# Patient Record
Sex: Female | Born: 1955 | Race: White | Hispanic: No | Marital: Married | Smoking: Never smoker
Health system: Southern US, Community
[De-identification: ages and names within clinical notes are randomized; demographics above are authoritative.]

## PROBLEM LIST (undated history)

## (undated) DIAGNOSIS — I719 Aortic aneurysm of unspecified site, without rupture: Secondary | ICD-10-CM

## (undated) DIAGNOSIS — F419 Anxiety disorder, unspecified: Secondary | ICD-10-CM

## (undated) DIAGNOSIS — E039 Hypothyroidism, unspecified: Secondary | ICD-10-CM

## (undated) DIAGNOSIS — E785 Hyperlipidemia, unspecified: Secondary | ICD-10-CM

## (undated) DIAGNOSIS — I10 Essential (primary) hypertension: Secondary | ICD-10-CM

## (undated) HISTORY — PX: TUBAL LIGATION: SHX77

## (undated) HISTORY — PX: MOHS SURGERY: SUR867

---

## 2020-05-21 DIAGNOSIS — E782 Mixed hyperlipidemia: Secondary | ICD-10-CM | POA: Insufficient documentation

## 2020-05-21 DIAGNOSIS — E039 Hypothyroidism, unspecified: Secondary | ICD-10-CM | POA: Insufficient documentation

## 2020-05-21 DIAGNOSIS — F419 Anxiety disorder, unspecified: Secondary | ICD-10-CM | POA: Insufficient documentation

## 2021-05-29 DIAGNOSIS — C801 Malignant (primary) neoplasm, unspecified: Secondary | ICD-10-CM

## 2021-05-29 HISTORY — DX: Malignant (primary) neoplasm, unspecified: C80.1

## 2021-07-29 ENCOUNTER — Ambulatory Visit: Payer: Self-pay | Admitting: Surgery

## 2021-07-31 ENCOUNTER — Other Ambulatory Visit: Payer: Self-pay | Admitting: Radiation Oncology

## 2021-07-31 ENCOUNTER — Ambulatory Visit
Admission: RE | Admit: 2021-07-31 | Discharge: 2021-07-31 | Disposition: A | Discharge status: Home / Self Care | Payer: Self-pay | Source: Ambulatory Visit | Attending: Radiation Oncology | Admitting: Radiation Oncology

## 2021-07-31 ENCOUNTER — Inpatient Hospital Stay
Admission: RE | Admit: 2021-07-31 | Discharge: 2021-07-31 | Disposition: A | Discharge status: Home / Self Care | Payer: Self-pay | Source: Ambulatory Visit | Attending: Radiation Oncology | Admitting: Radiation Oncology

## 2021-07-31 ENCOUNTER — Telehealth: Payer: Self-pay | Admitting: Hematology and Oncology

## 2021-07-31 DIAGNOSIS — C50412 Malignant neoplasm of upper-outer quadrant of left female breast: Secondary | ICD-10-CM

## 2021-07-31 NOTE — Telephone Encounter (Signed)
Scheduled appt per 5/3 referral. Pt is aware of appt date and time. Pt is aware to arrive 15 mins prior to appt time and to bring and updated insurance card. Pt is aware of appt location.   ?

## 2021-08-04 NOTE — Progress Notes (Signed)
?Radiation Oncology         [phone] ?________________________________ ? ?Name: Amber Mccormick        MRN: [id number]  ?Date of Service: 08/08/2021 DOB: 29-May-1955 ? ?HW:[id number], Truddie Crumble, PA-C  Erroll Luna, MD    ? ?REFERRING PHYSICIAN: Erroll Luna, MD ? ? ?DIAGNOSIS: The encounter diagnosis was Malignant neoplasm of upper-outer quadrant of left breast in female, estrogen receptor positive ([place]). ? ? ?HISTORY OF PRESENT ILLNESS: Amber Mccormick is a 66 y.o. female seen at the request of Dr. Brantley Stage for a diagnosis of left breast cancer.  The patient was found to have a palpable mass in her left breast and was seen by her PCP.  Diagnostic work-up included ultrasound as well and showed a  2 cm mass in the 2:30 position of the left breast, she also had a single left axillary lymph node that was suspicious, she underwent a biopsy at an outside facility of the breast and lymph node.  The breast biopsy showed grade 2 invasive ductal carcinoma, and metastatic carcinoma was seen in the axillary lymph node.  Her cancer was ER/PR positive, HER2 was amplified.  She has met with Dr. Brantley Stage and he recommended an MRI for extent of disease.  She is seen today to discuss treatment recommendations.  Dr. Brantley Stage recommends neoadjuvant chemotherapy prior to breast conserving surgery.  She met with Dr. Lindi Adie today as well.  She is seen by [place] this afternoon to discuss adjuvant radiotherapy following surgery. ? ? ? ?PREVIOUS RADIATION THERAPY: No ? ? ?PAST MEDICAL HISTORY: History reviewed. No pertinent past medical history.   ? ? ?PAST SURGICAL HISTORY:History reviewed. No pertinent surgical history. ? ? ?FAMILY HISTORY:  ?Family History  ?Problem Relation Age of Onset  ? Pancreatic cancer Father   ? Bone cancer Maternal Uncle   ? ? ? ?SOCIAL HISTORY:  reports that she has never smoked. She has never used smokeless tobacco. She reports that she does not drink alcohol. The patient is married and lives in [place].  She is accompanied by her husband.  ? ? ?ALLERGIES: Patient has no allergy information on record. ? ? ?MEDICATIONS:  ?Current Outpatient Medications  ?Medication Sig Dispense Refill  ? amitriptyline (ELAVIL) 10 MG tablet Take 10 mg by mouth at bedtime.    ? atorvastatin (LIPITOR) 10 MG tablet Take 10 mg by mouth daily.    ? dexamethasone (DECADRON) 4 MG tablet Take 1 tablet (4 mg total) by mouth 2 (two) times daily. 1 tablet day before chemo 1 tablet day after chemo with food 30 tablet 1  ? levothyroxine (SYNTHROID) 50 MCG tablet Take 50 mcg by mouth daily.    ? lidocaine-prilocaine (EMLA) cream Apply to affected area once 30 g 3  ? metroNIDAZOLE (METROCREAM) 0.75 % cream Apply topically 2 (two) times daily as needed.    ? ondansetron (ZOFRAN) 8 MG tablet Take 1 tablet (8 mg total) by mouth 2 (two) times daily as needed (Nausea or vomiting). Start on the third day after chemotherapy. 30 tablet 1  ? prochlorperazine (COMPAZINE) 10 MG tablet Take 1 tablet (10 mg total) by mouth every 6 (six) hours as needed (Nausea or vomiting). 30 tablet 1  ? vitamin B-12 (CYANOCOBALAMIN) 500 MCG tablet Take by mouth.    ? ?No current facility-administered medications for this encounter.  ? ? ? ?REVIEW OF SYSTEMS: On review of systems, the patient reports that she is doing pretty well overall. No breast specific complaints are noted.  ? ?  ? ?  PHYSICAL EXAM:  ?Wt Readings from Last 3 Encounters:  ?08/08/21 158 lb 14.4 oz (72.1 kg)  ? ?Temp Readings from Last 3 Encounters:  ?08/08/21 97.8 ?F (36.6 ?C) (Temporal)  ? ?BP Readings from Last 3 Encounters:  ?08/08/21 (!) 168/84  ? ?Pulse Readings from Last 3 Encounters:  ?08/08/21 82  ? ? ?In general this is a well appearing caucasian female in no acute distress. She's alert and oriented x4 and appropriate throughout the examination. Cardiopulmonary assessment is negative for acute distress and she exhibits normal effort. Bilateral breast exam is deferred. ? ? ? ?ECOG = 0 ? ?0 -  Asymptomatic (Fully active, able to carry on all predisease activities without restriction) ? ?1 - Symptomatic but completely ambulatory (Restricted in physically strenuous activity but ambulatory and able to carry out work of a light or sedentary nature. For example, light housework, office work) ? ?2 - Symptomatic, <50% in bed during the day (Ambulatory and capable of all self care but unable to carry out any work activities. Up and about more than 50% of waking hours) ? ?3 - Symptomatic, >50% in bed, but not bedbound (Capable of only limited self-care, confined to bed or chair 50% or more of waking hours) ? ?4 - Bedbound (Completely disabled. Cannot carry on any self-care. Totally confined to bed or chair) ? ?5 - Death ? ? Oken MM, Creech RH, Tormey DC, et al. [id number]). "Toxicity and response criteria of the [facility]". [place] Oncol. 5 (6): 649-55 ? ? ? ?LABORATORY DATA:  ?No results found for: WBC, HGB, HCT, MCV, PLT ?No results found for: NA, K, CL, CO2 ?No results found for: ALT, AST, GGT, ALKPHOS, BILITOT ?  ? ?RADIOGRAPHY: No results found. ?   ? ?IMPRESSION/PLAN: ?1. 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 Dr. Lisbeth Renshaw discusses the pathology findings and reviews the nature of left breast disease. She is planning to start neoadjuvant chemotherapy followed by breast conserving surgery. Dr. Lisbeth Renshaw discusses the rationale for  external radiotherapy to the breast  to reduce risks of local recurrence followed by antiestrogen therapy. We discussed the risks, benefits, short, and long term effects of radiotherapy, as well as the curative intent, and the patient is interested in proceeding. Dr. Lisbeth Renshaw discusses the delivery and logistics of radiotherapy and anticipates a course of 6 1/2 weeks of radiotherapy to the left breast and regional nodes with deep inspiration breath hold technique. We will see her back a few weeks after surgery to  discuss the simulation process and anticipate we starting radiotherapy about 4-6 weeks after surgery.  ?  ? ?In a visit lasting 60 minutes, greater than 50% of the time was spent face to face reviewing her case, as well as in preparation of, discussing, and coordinating the patient's care. I was located remotely via Webex while she saw the rest of our team in the clinic. ? ?The above documentation reflects my direct findings during this shared patient visit. Please see the separate note by Dr. Lisbeth Renshaw on this date for the remainder of the patient's plan of care. ? ? ? ?Carola Rhine, PAC ? ? ? ?**Disclaimer: This note was dictated with voice recognition software. Similar sounding words can inadvertently be transcribed and this note may contain transcription errors which may not have been corrected upon publication of note.** ?

## 2021-08-06 ENCOUNTER — Other Ambulatory Visit: Payer: Self-pay | Admitting: Surgery

## 2021-08-06 DIAGNOSIS — C50412 Malignant neoplasm of upper-outer quadrant of left female breast: Secondary | ICD-10-CM

## 2021-08-07 NOTE — Progress Notes (Signed)
New Breast Cancer Diagnosis: Left Breast UOQ ? ?Did patient present with symptoms (if so, please note symptoms) or screening mammography?: Patient notes a mass in her left breast about 2 months ago.  She brought this to her PCP's attention and ultrasound was done. ? ?Location and Extent of disease :left breast. Located at 2:30 o'clock position, measured 2 cm in greatest dimension. Adenopathy yes. ? ?Histology per Pathology Report: grade 2, Invasive Ductal Carcinoma ? ?Receptor Status: ER(positive), PR (positive), Her2-neu (positive), Ki-(20%) ? ? ?Surgeon and surgical plan, if any:  ?Dr. Brantley Stage 07/29/2021 ?-Refer to medical oncology for consideration of neoadjuvant chemotherapy given positive node in 2 cm mass making her stage II.  ?-If not possible, can proceed with left breast lumpectomy with sentinel lymph node mapping and targeted left axillary lymph node biopsy. They like the idea of chemotherapy first if she is a candidate.  ?- Ambulatory Referral to Radiation Oncology ?- MRI breast bilateral with and without contrast; Future ? ? ?Medical oncologist, treatment if any:   ?Dr. Lindi Adie 08/08/2021 1 pm ?Recommendation based on multidisciplinary tumor board: ?1. Neoadjuvant chemotherapy with TCHP x6 followed by HP maintenance versus Kadcyla maintenance ?2. Followed by breast conserving surgery with targeted axillary dissection ?3. Followed by adjuvant radiation therapy ?4.  Followed by adjuvant antiestrogen therapy ?-Chemo start date: undecided at this time, waiting for port insertion. ? ? ?Family History of Breast/Ovarian/Prostate Cancer: No ? ?Lymphedema issues, if any: No    ? ?Pain issues, if any: No   ? ?SAFETY ISSUES: ?Prior radiation? No ?Pacemaker/ICD? no ?Possible current pregnancy? Postmenopausal ?Is the patient on methotrexate? No ? ?Current Complaints / other details:   ? ?

## 2021-08-08 ENCOUNTER — Ambulatory Visit
Admission: RE | Admit: 2021-08-08 | Discharge: 2021-08-08 | Disposition: A | Discharge status: Home / Self Care | Payer: Medicare Other | Source: Ambulatory Visit | Attending: Radiation Oncology | Admitting: Radiation Oncology

## 2021-08-08 ENCOUNTER — Other Ambulatory Visit: Payer: Self-pay

## 2021-08-08 ENCOUNTER — Ambulatory Visit: Payer: Self-pay | Admitting: Surgery

## 2021-08-08 ENCOUNTER — Encounter: Payer: Self-pay | Admitting: Radiation Oncology

## 2021-08-08 ENCOUNTER — Inpatient Hospital Stay: Payer: Medicare Other | Admitting: Emergency Medicine

## 2021-08-08 ENCOUNTER — Inpatient Hospital Stay: Payer: Medicare Other | Attending: Hematology and Oncology | Admitting: Hematology and Oncology

## 2021-08-08 VITALS — BP 168/84 | HR 82 | Temp 97.8°F | Resp 19 | Ht 65.5 in | Wt 158.9 lb

## 2021-08-08 DIAGNOSIS — Z170 Estrogen receptor positive status [ER+]: Secondary | ICD-10-CM | POA: Insufficient documentation

## 2021-08-08 DIAGNOSIS — Z79899 Other long term (current) drug therapy: Secondary | ICD-10-CM | POA: Insufficient documentation

## 2021-08-08 DIAGNOSIS — Z5112 Encounter for antineoplastic immunotherapy: Secondary | ICD-10-CM | POA: Insufficient documentation

## 2021-08-08 DIAGNOSIS — C50412 Malignant neoplasm of upper-outer quadrant of left female breast: Secondary | ICD-10-CM | POA: Insufficient documentation

## 2021-08-08 DIAGNOSIS — C773 Secondary and unspecified malignant neoplasm of axilla and upper limb lymph nodes: Secondary | ICD-10-CM | POA: Insufficient documentation

## 2021-08-08 DIAGNOSIS — Z5189 Encounter for other specified aftercare: Secondary | ICD-10-CM | POA: Diagnosis not present

## 2021-08-08 DIAGNOSIS — Z5111 Encounter for antineoplastic chemotherapy: Secondary | ICD-10-CM | POA: Insufficient documentation

## 2021-08-08 MED ORDER — ONDANSETRON HCL 8 MG PO TABS: 8.0000 mg | ORAL_TABLET | Freq: Two times a day (BID) | ORAL | 1 refills | Status: DC | PRN

## 2021-08-08 MED ORDER — LIDOCAINE-PRILOCAINE 2.5-2.5 % EX CREA: TOPICAL_CREAM | CUTANEOUS | 3 refills | Status: DC

## 2021-08-08 MED ORDER — PROCHLORPERAZINE MALEATE 10 MG PO TABS: 10.0000 mg | ORAL_TABLET | Freq: Four times a day (QID) | ORAL | 1 refills | Status: DC | PRN

## 2021-08-08 MED ORDER — DEXAMETHASONE 4 MG PO TABS: 4.0000 mg | ORAL_TABLET | Freq: Two times a day (BID) | ORAL | 1 refills | Status: DC

## 2021-08-08 NOTE — Assessment & Plan Note (Addendum)
07/01/2021:Screening mammogram 06/27/2021 detected abnormality in the left breast which led to diagnostic mammogram and left breast ultrasound that revealed hypoechoic mass with irregular borders 2:30 position 2 cm left axilla: 1 abnormal lymph node, grade 2 IDC with DCIS ER 99%, PR 95%, HER2 2+ by IHC, FISH positive, Ki-67 20%, left axilla lymph node: Positive  ? ?Pathology and radiology counseling: Discussed with the patient, the details of pathology including the type of breast cancer,the clinical staging, the significance of ER, PR and HER-2/neu receptors and the implications for treatment. After reviewing the pathology in detail, we proceeded to discuss the different treatment options between surgery, radiation, chemotherapy, antiestrogen therapies. ? ?Recommendation based on multidisciplinary tumor board: ?1. Neoadjuvant chemotherapy with TCHP x6 followed by HP maintenance versus Kadcyla maintenance ?2. Followed by breast conserving surgery with targeted axillary dissection ?3. Followed by adjuvant radiation therapy ?4.  Followed by adjuvant antiestrogen therapy ? ?Chemotherapy Counseling: I discussed the risks and benefits of chemotherapy including the risks of nausea/ vomiting, risk of infection from low WBC count, fatigue due to chemo or anemia, bruising or bleeding due to low platelets, mouth sores, loss/ change in taste and decreased appetite. Liver and kidney function will be monitored through out chemotherapy as abnormalities in liver and kidney function may be a side effect of treatment.  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 Risk of permanent bone marrow dysfunction due to chemo were also discussed. ? ?Plan: ?1. Port placement  ?2. Echocardiogram ?3. Chemotherapy class ?4. Breast MRI ?5. CT chest abdomen pelvis and bone scan for staging ?Genetic counseling will also be arranged ? ?[place]: Treatment of refractory nausea.  After first  cycle of chemo if patient experience chemo induced nausea and vomiting the randomized from cycle 2 to Aloxi plus Dex plus olanzapine or placebo plus Compazine or placebo plus placebo prior to chemo and take home medications for day 2 today for of Dex plus olanzapine or placebo and Compazine or placebo every 8 hours.  If patient does not have nausea after cycle 1, then the trial is complete. ? ?Return to clinic in 1 week to start chemotherapy. ? ? ?

## 2021-08-08 NOTE — Progress Notes (Signed)
START ON PATHWAY REGIMEN - Breast ? ? ?  Cycle 1: A cycle is 21 days: ?    Pertuzumab  ?    Trastuzumab-xxxx  ?    Docetaxel  ?    Carboplatin  ?  Cycles 2 through 6: A cycle is every 21 days: ?    Pertuzumab  ?    Trastuzumab-xxxx  ?    Docetaxel  ?    Carboplatin  ? ?**Always confirm dose/schedule in your pharmacy ordering system** ? ?Patient Characteristics: ?Preoperative or Nonsurgical Candidate (Clinical Staging), Neoadjuvant Therapy followed by Surgery, Invasive Disease, Chemotherapy, HER2 Positive, ER Positive ?Therapeutic Status: Preoperative or Nonsurgical Candidate (Clinical Staging) ?AJCC M Category: cM0 ?AJCC Grade: G2 ?Breast Surgical Plan: Neoadjuvant Therapy followed by Surgery ?ER Status: Positive (+) ?AJCC 8 Stage Grouping: IB ?HER2 Status: Positive (+) ?AJCC T Category: cT1c ?AJCC N Category: cN1 ?PR Status: Positive (+) ?Intent of Therapy: ?Curative Intent, Discussed with Patient ?

## 2021-08-08 NOTE — Progress Notes (Signed)
[facility] ?CONSULT NOTE ? ?Patient Care Team: ?Kathreen Devoid, PA-C as PCP - General (Internal Medicine) ? ?CHIEF COMPLAINTS/PURPOSE OF CONSULTATION:  ?Newly diagnosed breast cancer ? ?HISTORY OF PRESENTING ILLNESS:  ?Amber Mccormick 66 y.o. female is here because of recent diagnosis of left breast cancer.  She had a screening mammogram in March which detected abnormality in the left breast which led to additional mammograms and ultrasounds that revealed hypoechoic mass with irregular borders along with an axillary lymph node.  Biopsy of the breast and the lymph node were positive for grade 2 invasive ductal carcinoma with DCIS that was ER/PR positive HER2 positive with a Ki-67 of 20%.  The lymph node biopsy was also positive.  She was referred to [place] for discussion regarding treatment. ? ?I reviewed her records extensively and collaborated the history with the patient. ? ?SUMMARY OF ONCOLOGIC HISTORY: ?Oncology History  ?Malignant neoplasm of upper-outer quadrant of left breast, estrogen receptor positive ([place])  ?07/01/2021 Initial Diagnosis  ? Screening mammogram 06/27/2021 detected abnormality in the left breast which led to diagnostic mammogram and left breast ultrasound that revealed hypoechoic mass with irregular borders 2:30 position 2 cm left axilla: 1 abnormal lymph node, grade 2 IDC with DCIS ER 99%, PR 95%, HER2 2+ by IHC, FISH positive, Ki-67 20%, left axilla lymph node: Positive ?  ?08/08/2021 Cancer Staging  ? Staging form: Breast, AJCC 8th Edition ?- Clinical stage from 08/08/2021: Stage IB (cT1c, cN1(f), cM0, G2, ER+, PR+, HER2+) - Signed by Nicholas Lose, MD on 08/08/2021 ?Stage prefix: Initial diagnosis ?Method of lymph node assessment: Core biopsy ?Histologic grading system: 3 grade system ? ?  ? ? ? ?MEDICAL HISTORY: Hypothyroidism and hypercholesterolemia ?  ? ?SURGICAL HISTORY: Basal cell skin cancer surgery ?  ? ?SOCIAL HISTORY: Denies any tobacco alcohol or recreational drug  use ?FAMILY HISTORY: No family history of breast cancer ?No family history on file. ? ?ALLERGIES:  has no allergies on file. ? ?MEDICATIONS: Synthroid and cholesterol medication ?REVIEW OF SYSTEMS:   ?Constitutional: Denies fevers, chills or abnormal night sweats ?  ?All other systems were reviewed with the patient and are negative. ? ?PHYSICAL EXAMINATION: ?ECOG PERFORMANCE STATUS: 1 - Symptomatic but completely ambulatory ? ?Vitals:  ? 08/08/21 1257  ?BP: (!) 168/84  ?Pulse: 82  ?Resp: 19  ?Temp: 97.8 ?F (36.6 ?C)  ?SpO2: 97%  ? ?Filed Weights  ? 08/08/21 1257  ?Weight: 158 lb 14.4 oz (72.1 kg)  ? ?   ? ?RADIOGRAPHIC STUDIES: ?I have personally reviewed the radiological reports and agreed with the findings in the report. ? ?ASSESSMENT AND PLAN:  ?Acute malignant neoplasm of upper-outer quadrant of left breast, estrogen receptor positive ([place]) ?07/01/2021:Screening mammogram 06/27/2021 detected abnormality in the left breast which led to diagnostic mammogram and left breast ultrasound that revealed hypoechoic mass with irregular borders 2:30 position 2 cm left axilla: 1 abnormal lymph node, grade 2 IDC with DCIS ER 99%, PR 95%, HER2 2+ by IHC, FISH positive, Ki-67 20%, left axilla lymph node: Positive  ? ?Pathology and radiology counseling: Discussed with the patient, the details of pathology including the type of breast cancer,the clinical staging, the significance of ER, PR and HER-2/neu receptors and the implications for treatment. After reviewing the pathology in detail, we proceeded to discuss the different treatment options between surgery, radiation, chemotherapy, antiestrogen therapies. ? ?Recommendation based on multidisciplinary tumor board: ?1. Neoadjuvant chemotherapy with TCHP x6 followed by HP maintenance versus Kadcyla maintenance ?2. Followed by breast  conserving surgery with targeted axillary dissection ?3. Followed by adjuvant radiation therapy ?4.  Followed by adjuvant antiestrogen  therapy ? ?Chemotherapy Counseling: I discussed the risks and benefits of chemotherapy including the risks of nausea/ vomiting, risk of infection from low WBC count, fatigue due to chemo or anemia, bruising or bleeding due to low platelets, mouth sores, loss/ change in taste and decreased appetite. Liver and kidney function will be monitored through out chemotherapy as abnormalities in liver and kidney function may be a side effect of treatment.  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 Risk of permanent bone marrow dysfunction due to chemo were also discussed. ? ?Plan: ?1. Port placement  ?2. Echocardiogram ?3. Chemotherapy class ?4. Breast MRI ? ? ?[place]: Treatment of refractory nausea.  After first cycle of chemo if patient experience chemo induced nausea and vomiting the randomized from cycle 2 to Aloxi plus Dex plus olanzapine or placebo plus Compazine or placebo plus placebo prior to chemo and take home medications for day 2 today for of Dex plus olanzapine or placebo and Compazine or placebo every 8 hours.  If patient does not have nausea after cycle 1, then the trial is complete. ?We also counseled her about the blood draw study ? ?Return to clinic in 1-2 weeks to start chemotherapy. ? ? ? ?All questions were answered. The patient knows to call the clinic with any problems, questions or concerns. ?  ? Harriette Ohara, MD ?08/08/21 ? ?

## 2021-08-08 NOTE — Research (Signed)
[id number] - TREATMENT OF REFRACTORY NAUSEA ? ?INTRO STUDY/CONSENTS ? ?Patient Amber Mccormick was identified by MD Lindi Adie as a potential candidate for the above listed study.  This Clinical Research Nurse met with Amber Mccormick, [id number], on 08/08/21 in a manner and location that ensures patient privacy to discuss participation in the above listed research study.  Patient is Accompanied by spouse .  A copy of the informed consent document and separate HIPAA Authorization was provided to the patient.  Patient reads, speaks, and understands [place].   ?Patient was provided with the business card of this Nurse and encouraged to contact the research team with any questions.  Approximately 25 minutes were spent with the patient reviewing the informed consent documents.  Patient was provided the option of taking informed consent documents home to review and was encouraged to review at their convenience with their support network, including other care providers. Patient took the consent documents home to review.  Will f/u on patient interest in the next few days. ? ?Amber Guiles 'Learta Codding' Carman Essick, RN, BSN ?Clinical Research Nurse I ?08/08/21 ?2:14 PM ? ? ?

## 2021-08-08 NOTE — Research (Signed)
[employer] 2021-05 - Specimen Collection Study to Evaluate Biomarkers in Subjects with Cancer  ? ?INTRO STUDY/CONSENTS ? ?Patient Amber Mccormick was identified by MD Lindi Adie as a potential candidate for the above listed study.  This Clinical Research Nurse met with Debraann Livingstone, [id number], on 08/08/21 in a manner and location that ensures patient privacy to discuss participation in the above listed research study.  Patient is Accompanied by spouse .  A copy of the informed consent document with embedded HIPAA language was provided to the patient.  Patient reads, speaks, and understands [place].   ?Patient was provided with the business card of this Nurse and encouraged to contact the research team with any questions.  Approximately 25 minutes were spent with the patient reviewing the informed consent documents.  Patient was provided the option of taking informed consent documents home to review and was encouraged to review at their convenience with their support network, including other care providers. Patient took the consent documents home to review.  Will f/u with patient in the next few days to determine interest. ? ?Wells Guiles 'LizaNeysa Bonito, RN, BSN ?Clinical Research Nurse I ?08/08/21 ?2:16 PM ? ? ?

## 2021-08-09 ENCOUNTER — Encounter: Payer: Self-pay | Admitting: *Deleted

## 2021-08-09 ENCOUNTER — Telehealth: Payer: Self-pay | Admitting: Hematology and Oncology

## 2021-08-09 ENCOUNTER — Encounter: Payer: Self-pay | Admitting: Hematology and Oncology

## 2021-08-09 ENCOUNTER — Telehealth: Payer: Self-pay | Admitting: *Deleted

## 2021-08-09 NOTE — Telephone Encounter (Signed)
Scheduled appointment per 5/11 los. Patient is aware. ?

## 2021-08-09 NOTE — Telephone Encounter (Signed)
Spoke to pt, provided navigation resources and contact information. Denies questions or concerns regarding dx or treatment care plan. Discussed further appts and port placement. Informed echo and chemo class will be scheduled earlier d/t port placement and chemo start date. Encourage pt to call with needs. Received verbal understanding. ?

## 2021-08-12 ENCOUNTER — Encounter: Payer: Self-pay | Admitting: *Deleted

## 2021-08-13 ENCOUNTER — Telehealth: Payer: Self-pay | Admitting: Emergency Medicine

## 2021-08-13 ENCOUNTER — Other Ambulatory Visit: Payer: Self-pay | Admitting: Emergency Medicine

## 2021-08-13 ENCOUNTER — Encounter (HOSPITAL_BASED_OUTPATIENT_CLINIC_OR_DEPARTMENT_OTHER): Payer: Self-pay | Admitting: Surgery

## 2021-08-13 ENCOUNTER — Other Ambulatory Visit: Payer: Medicare Other

## 2021-08-13 ENCOUNTER — Other Ambulatory Visit: Payer: Self-pay

## 2021-08-13 DIAGNOSIS — C50412 Malignant neoplasm of upper-outer quadrant of left female breast: Secondary | ICD-10-CM

## 2021-08-13 NOTE — Telephone Encounter (Signed)
[id number] - TREATMENT OF REFRACTORY NAUSEA ? ?Called patient to let her know she is ineligible for [employer] [facility] [id number] study d/t being on amitriptyline which is an anticholinergic medication prohibited by the study.  Pt verbalized understanding and denies any questions or concerns at this time. ? ?Wells Guiles 'Learta Codding' Shanda Cadotte, RN, BSN ?Clinical Research Nurse I ?08/13/21 ?11:54 AM ? ?

## 2021-08-13 NOTE — Telephone Encounter (Signed)
[employer] 2021-05 - Specimen Collection Study to Evaluate Biomarkers in Subjects with Cancer  ? ?Called patient to f/u on potential interest in [place] blood draw study.  Pt states she is interested, agreed to see Research RN on 08/19/21 after chemo education appt to discuss and sign consents.  Pt to have blood drawn on 08/22/21.  Pt denies any questions/concerns at this time. ? ?Wells Guiles 'Learta Codding' Sahaana Weitman, RN, BSN ?Clinical Research Nurse I ?08/13/21 ?11:56 AM ? ?

## 2021-08-15 ENCOUNTER — Telehealth: Payer: Self-pay | Admitting: *Deleted

## 2021-08-15 ENCOUNTER — Telehealth: Payer: Self-pay | Admitting: Emergency Medicine

## 2021-08-15 NOTE — Telephone Encounter (Signed)
Patient called Research Nurse to ask about oral contrast requirement for her upcoming imaging.  Nurse [place] S to f/u with her on questions.  Wells Guiles 'Learta CoddingNeysa Bonito, RN, BSN Clinical Research Nurse I 08/15/21 10:58 AM

## 2021-08-15 NOTE — Progress Notes (Signed)
Patient Care Team: Kathreen Devoid, PA-C as PCP - General (Internal Medicine) Mauro Kaufmann, RN as Oncology Nurse Navigator Rockwell Germany, RN as Oncology Nurse Navigator Nicholas Lose, MD as Consulting Physician (Hematology and Oncology)  DIAGNOSIS:  Encounter Diagnosis  Name Primary?   Malignant neoplasm of upper-outer quadrant of left breast in female, estrogen receptor positive ([place])     SUMMARY OF ONCOLOGIC HISTORY: Oncology History  Malignant neoplasm of upper-outer quadrant of left breast, estrogen receptor positive ([place])  07/01/2021 Initial Diagnosis   Screening mammogram 06/27/2021 detected abnormality in the left breast which led to diagnostic mammogram and left breast ultrasound that revealed hypoechoic mass with irregular borders 2:30 position 2 cm left axilla: 1 abnormal lymph node, grade 2 IDC with DCIS ER 99%, PR 95%, HER2 2+ by IHC, FISH positive, Ki-67 20%, left axilla lymph node: Positive   08/08/2021 Cancer Staging   Staging form: Breast, AJCC 8th Edition - Clinical stage from 08/08/2021: Stage IB (cT1c, cN1(f), cM0, G2, ER+, PR+, HER2+) - Signed by Nicholas Lose, MD on 08/08/2021 Stage prefix: Initial diagnosis Method of lymph node assessment: Core biopsy Histologic grading system: 3 grade system    08/22/2021 -  Chemotherapy   Patient is on Treatment Plan : BREAST  Docetaxel + Carboplatin + Trastuzumab + Pertuzumab  (TCHP) q21d         CHIEF COMPLIANT: Cycle 1 day 1 TCHP (08/23/2021)  INTERVAL HISTORY: Amber Mccormick is a 66 y.o. female is here because of recent diagnosis of left breast cancer. She presents to the clinic today for a follow-up.  She will receive her first cycle of chemotherapy tomorrow.  She is slightly sore from the port but otherwise doing quite well.  She wanted to review the breast MRI report as well as her echocardiogram reports.   ALLERGIES:  has No Known Allergies.  MEDICATIONS:  Current Outpatient Medications  Medication  Sig Dispense Refill   amitriptyline (ELAVIL) 10 MG tablet Take 10 mg by mouth at bedtime.     atorvastatin (LIPITOR) 10 MG tablet Take 10 mg by mouth daily.     cholecalciferol (VITAMIN D3) 25 MCG (1000 UNIT) tablet Take 1,000 Units by mouth daily.     dexamethasone (DECADRON) 4 MG tablet Take 1 tablet (4 mg total) by mouth 2 (two) times daily. 1 tablet day before chemo 1 tablet day after chemo with food 30 tablet 1   diphenhydrAMINE (BENADRYL) 25 MG tablet Take 25 mg by mouth every 6 (six) hours as needed.     ibuprofen (ADVIL) 800 MG tablet Take 1 tablet (800 mg total) by mouth every 8 (eight) hours as needed. 30 tablet 0   levothyroxine (SYNTHROID) 50 MCG tablet Take 50 mcg by mouth daily.     lidocaine-prilocaine (EMLA) cream Apply to affected area once 30 g 3   metroNIDAZOLE (METROCREAM) 0.75 % cream Apply topically 2 (two) times daily as needed.     ondansetron (ZOFRAN) 8 MG tablet Take 1 tablet (8 mg total) by mouth 2 (two) times daily as needed (Nausea or vomiting). Start on the third day after chemotherapy. 30 tablet 1   oxyCODONE (OXY IR/ROXICODONE) 5 MG immediate release tablet Take 1 tablet (5 mg total) by mouth every 6 (six) hours as needed for severe pain. 15 tablet 0   prochlorperazine (COMPAZINE) 10 MG tablet Take 1 tablet (10 mg total) by mouth every 6 (six) hours as needed (Nausea or vomiting). 30 tablet 1   vitamin B-12 (CYANOCOBALAMIN) 500  MCG tablet Take by mouth.     vitamin C (ASCORBIC ACID) 500 MG tablet Take 500 mg by mouth daily.     No current facility-administered medications for this visit.    PHYSICAL EXAMINATION: ECOG PERFORMANCE STATUS: 1 - Symptomatic but completely ambulatory  Vitals:   08/22/21 1127  BP: (!) 154/76  Pulse: 74  Resp: 18  Temp: 97.8 F (36.6 C)  SpO2: 97%   Filed Weights   08/22/21 1127  Weight: 160 lb 11.2 oz (72.9 kg)     LABORATORY DATA:  I have reviewed the data as listed    Latest Ref Rng & Units 08/22/2021   11:24 AM  CMP   Glucose 70 - 99 mg/dL 120    BUN 8 - 23 mg/dL 12    Creatinine 0.44 - 1.00 mg/dL 0.75    Sodium 135 - 145 mmol/L 141    Potassium 3.5 - 5.1 mmol/L 4.0    Chloride 98 - 111 mmol/L 105    CO2 22 - 32 mmol/L 29    Calcium 8.9 - 10.3 mg/dL 9.2    Total Protein 6.5 - 8.1 g/dL 7.2    Total Bilirubin 0.3 - 1.2 mg/dL 0.4    Alkaline Phos 38 - 126 U/L 66    AST 15 - 41 U/L 17    ALT 0 - 44 U/L 12      Lab Results  Component Value Date   WBC 9.0 08/22/2021   HGB 13.3 08/22/2021   HCT 39.0 08/22/2021   MCV 89.9 08/22/2021   PLT 210 08/22/2021   NEUTROABS 7.5 08/22/2021    ASSESSMENT & PLAN:  Malignant neoplasm of upper-outer quadrant of left breast, estrogen receptor positive ([place]) 07/01/2021:Screening mammogram 06/27/2021 detected abnormality in the left breast which led to diagnostic mammogram and left breast ultrasound that revealed hypoechoic mass with irregular borders 2:30 position 2 cm left axilla: 1 abnormal lymph node, grade 2 IDC with DCIS ER 99%, PR 95%, HER2 2+ by IHC, FISH positive, Ki-67 20%, left axilla lymph node: Positive   Treatment plan: 1. Neoadjuvant chemotherapy with TCHP x6 followed by HP maintenance versus Kadcyla maintenance 2. Followed by breast conserving surgery with targeted axillary dissection 3. Followed by adjuvant radiation therapy 4.  Followed by adjuvant antiestrogen therapy -------------------------------------------------------------------------------------------------------------------------------------- Current treatment: Cycle 1 day 1 TCHP Chemo consent obtained, chemo education completed, labs reviewed, antiemetics were reviewed Echocardiogram 08/03/2021: EF 65 to 70%  Return to clinic in 1 week for toxicity check    No orders of the defined types were placed in this encounter.  The patient has a good understanding of the overall plan. she agrees with it. she will call with any problems that may develop before the next visit here. Total time  spent: 30 mins including face to face time and time spent for planning, charting and co-ordination of care   Harriette Ohara, MD 08/22/21    I Gardiner Coins am scribing for Dr. Lindi Adie  I have reviewed the above documentation for accuracy and completeness, and I agree with the above.

## 2021-08-15 NOTE — Telephone Encounter (Signed)
Spoke to pt regarding ERAS drink prior to surgery. Discussed it wasn't a contrast that you take prior to imaging but a drink to help healing after surgery. Received verbal understanding. Confirmed future appts. Denies further questions or needs. Encourage pt to call with further concerns. Received verbal understanding.

## 2021-08-16 ENCOUNTER — Ambulatory Visit
Admission: RE | Admit: 2021-08-16 | Discharge: 2021-08-16 | Disposition: A | Discharge status: Home / Self Care | Payer: Medicare Other | Source: Ambulatory Visit | Attending: Surgery | Admitting: Surgery

## 2021-08-16 ENCOUNTER — Other Ambulatory Visit: Payer: Medicare Other

## 2021-08-16 DIAGNOSIS — C50412 Malignant neoplasm of upper-outer quadrant of left female breast: Secondary | ICD-10-CM

## 2021-08-16 MED ORDER — GADOBUTROL 1 MMOL/ML IV SOLN
7.0000 mL | Freq: Once | INTRAVENOUS | Status: AC | PRN
  Administered 2021-08-16: 7 mL via INTRAVENOUS

## 2021-08-16 NOTE — Progress Notes (Signed)
Pharmacist Chemotherapy Monitoring - Initial Assessment    Anticipated start date: 08/23/21   The following has been reviewed per standard work regarding the patient's treatment regimen: The patient's diagnosis, treatment plan and drug doses, and organ/hematologic function Lab orders and baseline tests specific to treatment regimen  The treatment plan start date, drug sequencing, and pre-medications Prior authorization status  Patient's documented medication list, including drug-drug interaction screen and prescriptions for anti-emetics and supportive care specific to the treatment regimen The drug concentrations, fluid compatibility, administration routes, and timing of the medications to be used The patient's access for treatment and lifetime cumulative dose history, if applicable  The patient's medication allergies and previous infusion related reactions, if applicable   Changes made to treatment plan:  N/A  Follow up needed:  Pending authorization for treatment    Philomena Course, [place], 08/16/2021  1:02 PM

## 2021-08-19 ENCOUNTER — Inpatient Hospital Stay: Payer: Medicare Other

## 2021-08-19 ENCOUNTER — Inpatient Hospital Stay: Payer: Medicare Other | Admitting: Emergency Medicine

## 2021-08-19 ENCOUNTER — Encounter: Payer: Self-pay | Admitting: *Deleted

## 2021-08-19 ENCOUNTER — Ambulatory Visit (HOSPITAL_COMMUNITY)
Admission: RE | Admit: 2021-08-19 | Discharge: 2021-08-19 | Disposition: A | Discharge status: Home / Self Care | Payer: Medicare Other | Source: Ambulatory Visit | Attending: Hematology and Oncology | Admitting: Hematology and Oncology

## 2021-08-19 ENCOUNTER — Other Ambulatory Visit: Payer: Self-pay

## 2021-08-19 DIAGNOSIS — I517 Cardiomegaly: Secondary | ICD-10-CM | POA: Insufficient documentation

## 2021-08-19 DIAGNOSIS — Z170 Estrogen receptor positive status [ER+]: Secondary | ICD-10-CM | POA: Diagnosis not present

## 2021-08-19 DIAGNOSIS — Z0189 Encounter for other specified special examinations: Secondary | ICD-10-CM | POA: Diagnosis not present

## 2021-08-19 DIAGNOSIS — C50412 Malignant neoplasm of upper-outer quadrant of left female breast: Secondary | ICD-10-CM | POA: Diagnosis present

## 2021-08-19 DIAGNOSIS — E785 Hyperlipidemia, unspecified: Secondary | ICD-10-CM | POA: Diagnosis not present

## 2021-08-19 LAB — ECHOCARDIOGRAM COMPLETE
AR max vel: 3.16 cm2
AV Area VTI: 2.82 cm2
AV Area mean vel: 2.89 cm2
AV Mean grad: 4 mmHg
AV Peak grad: 7.7 mmHg
Ao pk vel: 1.39 m/s
Calc EF: 63.5 %
S' Lateral: 2.4 cm
Single Plane A2C EF: 57.6 %
Single Plane A4C EF: 68.7 %

## 2021-08-19 NOTE — Research (Signed)
[employer] 2021-05 - Specimen Collection Study to Evaluate Biomarkers in Subjects with Cancer  And  DCP-001: Use of a Clinical Trial Screening Tool to Address Cancer Health Disparities in the [facility])  Reviewed consents with patient for EXACT and her husband for study who stated she was too busy and overwhelmed to participate in any research studies at this time.  Patient declined to participate in EXACT and DCP-001 (being introduced this visit).  Denied any questions/concerns but provided with contact information in case any come up in future.  Wells Guiles 'Learta CoddingNeysa Bonito, RN, BSN Clinical Research Nurse I 08/19/21 1:15 PM

## 2021-08-20 NOTE — Progress Notes (Signed)
The following biosimilar Kanjinti (trastuzumab-anns) has been selected for use in this patient.  Kennith Center, Pharm.D., CPP 08/20/2021'@11'$ :21 AM

## 2021-08-20 NOTE — Progress Notes (Signed)
The following biosimilar Ziextenzo (pegfilgrastim-bmez) has been selected for use in this patient.  Kennith Center, Pharm.D., CPP 08/20/2021'@11'$ :26 AM

## 2021-08-21 ENCOUNTER — Ambulatory Visit (HOSPITAL_BASED_OUTPATIENT_CLINIC_OR_DEPARTMENT_OTHER)
Admission: RE | Admit: 2021-08-21 | Discharge: 2021-08-21 | Disposition: A | Discharge status: Home / Self Care | Payer: Medicare Other | Attending: Surgery | Admitting: Surgery

## 2021-08-21 ENCOUNTER — Ambulatory Visit (HOSPITAL_COMMUNITY): Payer: Medicare Other

## 2021-08-21 ENCOUNTER — Other Ambulatory Visit: Payer: Self-pay

## 2021-08-21 ENCOUNTER — Encounter (HOSPITAL_BASED_OUTPATIENT_CLINIC_OR_DEPARTMENT_OTHER)
Admission: RE | Disposition: A | Discharge status: Home / Self Care | Payer: Self-pay | Source: Home / Self Care | Attending: Surgery

## 2021-08-21 ENCOUNTER — Ambulatory Visit (HOSPITAL_BASED_OUTPATIENT_CLINIC_OR_DEPARTMENT_OTHER): Payer: Medicare Other | Admitting: Anesthesiology

## 2021-08-21 ENCOUNTER — Encounter (HOSPITAL_BASED_OUTPATIENT_CLINIC_OR_DEPARTMENT_OTHER): Payer: Self-pay | Admitting: Surgery

## 2021-08-21 DIAGNOSIS — C50412 Malignant neoplasm of upper-outer quadrant of left female breast: Secondary | ICD-10-CM | POA: Diagnosis not present

## 2021-08-21 DIAGNOSIS — Z452 Encounter for adjustment and management of vascular access device: Secondary | ICD-10-CM

## 2021-08-21 DIAGNOSIS — E039 Hypothyroidism, unspecified: Secondary | ICD-10-CM | POA: Diagnosis not present

## 2021-08-21 DIAGNOSIS — Z170 Estrogen receptor positive status [ER+]: Secondary | ICD-10-CM | POA: Diagnosis not present

## 2021-08-21 HISTORY — DX: Anxiety disorder, unspecified: F41.9

## 2021-08-21 HISTORY — DX: Hyperlipidemia, unspecified: E78.5

## 2021-08-21 HISTORY — PX: PORTACATH PLACEMENT: SHX2246

## 2021-08-21 HISTORY — DX: Hypothyroidism, unspecified: E03.9

## 2021-08-21 SURGERY — INSERTION, TUNNELED CENTRAL VENOUS DEVICE, WITH PORT
Anesthesia: General | Site: Chest | Laterality: Right

## 2021-08-21 MED ORDER — CEFAZOLIN SODIUM-DEXTROSE 2-4 GM/100ML-% IV SOLN
2.0000 g | Freq: Once | INTRAVENOUS | Status: AC
  Administered 2021-08-21: 2 g via INTRAVENOUS

## 2021-08-21 MED ORDER — FENTANYL CITRATE (PF) 100 MCG/2ML IJ SOLN
INTRAMUSCULAR | Status: AC
  Filled 2021-08-21: qty 2

## 2021-08-21 MED ORDER — PROPOFOL 10 MG/ML IV BOLUS
INTRAVENOUS | Status: DC | PRN
  Administered 2021-08-21: 150 mg via INTRAVENOUS

## 2021-08-21 MED ORDER — HEPARIN SOD (PORK) LOCK FLUSH 100 UNIT/ML IV SOLN
INTRAVENOUS | Status: DC | PRN
  Administered 2021-08-21: 500 [IU] via INTRAVENOUS

## 2021-08-21 MED ORDER — HEPARIN (PORCINE) IN NACL 2-0.9 UNITS/ML
INTRAMUSCULAR | Status: AC | PRN
  Administered 2021-08-21: 1

## 2021-08-21 MED ORDER — PROPOFOL 10 MG/ML IV BOLUS
INTRAVENOUS | Status: AC
  Filled 2021-08-21: qty 20

## 2021-08-21 MED ORDER — CHLORHEXIDINE GLUCONATE CLOTH 2 % EX PADS: 6.0000 | MEDICATED_PAD | Freq: Once | CUTANEOUS | Status: DC

## 2021-08-21 MED ORDER — LIDOCAINE 2% (20 MG/ML) 5 ML SYRINGE
INTRAMUSCULAR | Status: AC
  Filled 2021-08-21: qty 5

## 2021-08-21 MED ORDER — HEPARIN (PORCINE) IN NACL 1000-0.9 UT/500ML-% IV SOLN
INTRAVENOUS | Status: AC
  Filled 2021-08-21: qty 500

## 2021-08-21 MED ORDER — ACETAMINOPHEN 500 MG PO TABS
ORAL_TABLET | ORAL | Status: AC
  Filled 2021-08-21: qty 2

## 2021-08-21 MED ORDER — FENTANYL CITRATE (PF) 100 MCG/2ML IJ SOLN
INTRAMUSCULAR | Status: DC | PRN
  Administered 2021-08-21: 50 ug via INTRAVENOUS

## 2021-08-21 MED ORDER — CEFAZOLIN SODIUM-DEXTROSE 2-4 GM/100ML-% IV SOLN
INTRAVENOUS | Status: AC
  Filled 2021-08-21: qty 100

## 2021-08-21 MED ORDER — LIDOCAINE HCL (CARDIAC) PF 100 MG/5ML IV SOSY
PREFILLED_SYRINGE | INTRAVENOUS | Status: DC | PRN
  Administered 2021-08-21: 60 mg via INTRATRACHEAL

## 2021-08-21 MED ORDER — BUPIVACAINE-EPINEPHRINE 0.25% -1:200000 IJ SOLN
INTRAMUSCULAR | Status: DC | PRN
  Administered 2021-08-21: 10 mL

## 2021-08-21 MED ORDER — LACTATED RINGERS IV SOLN: INTRAVENOUS | Status: DC

## 2021-08-21 MED ORDER — MIDAZOLAM HCL 2 MG/2ML IJ SOLN
INTRAMUSCULAR | Status: AC
  Filled 2021-08-21: qty 2

## 2021-08-21 MED ORDER — IBUPROFEN 800 MG PO TABS: 800.0000 mg | ORAL_TABLET | Freq: Three times a day (TID) | ORAL | 0 refills | Status: DC | PRN

## 2021-08-21 MED ORDER — DEXAMETHASONE SODIUM PHOSPHATE 10 MG/ML IJ SOLN
INTRAMUSCULAR | Status: AC
  Filled 2021-08-21: qty 1

## 2021-08-21 MED ORDER — DEXTROSE 5 % IV SOLN: 2000.0000 mg | Freq: Once | INTRAVENOUS | Status: DC

## 2021-08-21 MED ORDER — DEXAMETHASONE SODIUM PHOSPHATE 10 MG/ML IJ SOLN
INTRAMUSCULAR | Status: DC | PRN
  Administered 2021-08-21: 5 mg via INTRAVENOUS

## 2021-08-21 MED ORDER — MIDAZOLAM HCL 5 MG/5ML IJ SOLN
INTRAMUSCULAR | Status: DC | PRN
Start: 2021-08-21 — End: 2021-08-21
  Administered 2021-08-21: 2 mg via INTRAVENOUS

## 2021-08-21 MED ORDER — CEFAZOLIN IN SODIUM CHLORIDE 3-0.9 GM/100ML-% IV SOLN: 3.0000 g | INTRAVENOUS | Status: DC

## 2021-08-21 MED ORDER — ACETAMINOPHEN 500 MG PO TABS
1000.0000 mg | ORAL_TABLET | Freq: Once | ORAL | Status: AC
  Administered 2021-08-21: 1000 mg via ORAL

## 2021-08-21 MED ORDER — ONDANSETRON HCL 4 MG/2ML IJ SOLN
INTRAMUSCULAR | Status: DC | PRN
  Administered 2021-08-21: 4 mg via INTRAVENOUS

## 2021-08-21 MED ORDER — FENTANYL CITRATE (PF) 100 MCG/2ML IJ SOLN: 25.0000 ug | INTRAMUSCULAR | Status: DC | PRN

## 2021-08-21 MED ORDER — ONDANSETRON HCL 4 MG/2ML IJ SOLN
INTRAMUSCULAR | Status: AC
  Filled 2021-08-21: qty 2

## 2021-08-21 MED ORDER — OXYCODONE HCL 5 MG PO TABS: 5.0000 mg | ORAL_TABLET | Freq: Four times a day (QID) | ORAL | 0 refills | Status: DC | PRN

## 2021-08-21 MED ORDER — PHENYLEPHRINE 80 MCG/ML (10ML) SYRINGE FOR IV PUSH (FOR BLOOD PRESSURE SUPPORT)
PREFILLED_SYRINGE | INTRAVENOUS | Status: AC
  Filled 2021-08-21: qty 10

## 2021-08-21 MED ORDER — HEPARIN SOD (PORK) LOCK FLUSH 100 UNIT/ML IV SOLN
INTRAVENOUS | Status: AC
Start: 2021-08-21 — End: ?
  Filled 2021-08-21: qty 5

## 2021-08-21 SURGICAL SUPPLY — 41 items
ADH SKN CLS APL DERMABOND .7 (GAUZE/BANDAGES/DRESSINGS) ×1
APL PRP STRL LF DISP 70% ISPRP (MISCELLANEOUS) ×1
BAG DECANTER FOR FLEXI CONT (MISCELLANEOUS) ×2 IMPLANT
BLADE HEX COATED 2.75 (ELECTRODE) ×2 IMPLANT
BLADE SURG 11 STRL SS (BLADE) ×2 IMPLANT
BLADE SURG 15 STRL LF DISP TIS (BLADE) ×1 IMPLANT
BLADE SURG 15 STRL SS (BLADE) ×2
CANISTER SUCT 1200ML W/VALVE (MISCELLANEOUS) IMPLANT
CHLORAPREP W/TINT 26 (MISCELLANEOUS) ×2 IMPLANT
COVER BACK TABLE 60X90IN (DRAPES) ×2 IMPLANT
COVER MAYO STAND STRL (DRAPES) ×2 IMPLANT
COVER PROBE 5X48 (MISCELLANEOUS) ×2
DERMABOND ADVANCED (GAUZE/BANDAGES/DRESSINGS) ×1
DERMABOND ADVANCED .7 DNX12 (GAUZE/BANDAGES/DRESSINGS) ×1 IMPLANT
DRAPE C-ARM 42X72 X-RAY (DRAPES) ×2 IMPLANT
DRAPE LAPAROSCOPIC ABDOMINAL (DRAPES) ×2 IMPLANT
DRAPE UTILITY XL STRL (DRAPES) ×2 IMPLANT
ELECT REM PT RETURN 9FT ADLT (ELECTROSURGICAL) ×2
ELECTRODE REM PT RTRN 9FT ADLT (ELECTROSURGICAL) ×1 IMPLANT
GLOVE BIO SURGEON STRL SZ 6.5 (GLOVE) ×1 IMPLANT
GLOVE BIOGEL PI IND STRL 8 (GLOVE) ×1 IMPLANT
GLOVE BIOGEL PI INDICATOR 8 (GLOVE) ×1
GLOVE ECLIPSE 8.0 STRL XLNG CF (GLOVE) ×2 IMPLANT
GOWN STRL REUS W/ TWL LRG LVL3 (GOWN DISPOSABLE) ×2 IMPLANT
GOWN STRL REUS W/ TWL XL LVL3 (GOWN DISPOSABLE) ×1 IMPLANT
GOWN STRL REUS W/TWL LRG LVL3 (GOWN DISPOSABLE) ×2
GOWN STRL REUS W/TWL XL LVL3 (GOWN DISPOSABLE) ×2
KIT CVR 48X5XPRB PLUP LF (MISCELLANEOUS) ×1 IMPLANT
KIT PORT POWER 8FR ISP CVUE (Port) ×1 IMPLANT
NDL HYPO 25X1 1.5 SAFETY (NEEDLE) ×1 IMPLANT
NEEDLE HYPO 25X1 1.5 SAFETY (NEEDLE) ×2 IMPLANT
PACK BASIN DAY SURGERY FS (CUSTOM PROCEDURE TRAY) ×2 IMPLANT
PENCIL SMOKE EVACUATOR (MISCELLANEOUS) ×2 IMPLANT
SET SHEATH INTRODUCER 10FR (MISCELLANEOUS) IMPLANT
SLEEVE SCD COMPRESS KNEE MED (STOCKING) ×2 IMPLANT
SUT MON AB 4-0 PC3 18 (SUTURE) ×2 IMPLANT
SUT PROLENE 2 0 SH DA (SUTURE) ×2 IMPLANT
SUT VICRYL 3-0 CR8 SH (SUTURE) ×2 IMPLANT
SYR 5ML LUER SLIP (SYRINGE) ×2 IMPLANT
SYR CONTROL 10ML LL (SYRINGE) ×2 IMPLANT
TOWEL GREEN STERILE FF (TOWEL DISPOSABLE) ×4 IMPLANT

## 2021-08-21 NOTE — Op Note (Signed)
Preoperative diagnosis: PAC needed for chemotherapy   Postoperative diagnosis: Same  Procedure: Portacath Placement with U/S and C arm guidance   Surgeon: Turner Daniels, MD, FACS  Anesthesia: General and 0.25 % marcaine with epinephrine  Clinical History and Indications: The patient is getting ready to begin chemotherapy for her cancer. She  needs a Port-A-Cath for venous access. Risk of bleeding, infection,  Collapse lung,  Death,  DVT,  Organ injury,  Mediastinal injury,  Injury to heart,  Injury to blood vessels,  Nerves,  Migration of catheter,  Embolization of catheter and the need for more surgery.  Description of Procedure: I have seen the patient in the holding area and confirmed the plans for the procedure as noted above. I reviewed the risks and complications again and the patient has no further questions. She wishes to proceed.   The patient was then taken to the operating room. After satisfactory general  anesthesia had been obtained the upper chest and lower neck were prepped and draped as a sterile field. The timeout was done.  The right internal jugular vein  was entered under U/S guidance  and the guidewire threaded into the superior vena cava right atrial area under fluoroscopic guidance. An incision was then made on the anterior chest wall and a subcutaneous pocket fashioned for the port reservoir.  The port tubing was then brought through a subcutaneous tunnel from the port site to the guidewire site.  The port and catheter were attached, locked  and flushed. The catheter was measured and cut to appropriate length.The dilator and peel-away sheath were then advanced over the guidewire while monitoring this with fluoroscopy. The guidewire and dilator were removed and the tubing threaded to approximately 20 cm. The peel-away sheath was then removed. The catheter aspirated and flushed easily. Using fluoroscopy the tip was in the superior vena cava right atrial junction area. It  aspirated and flushed easily. That aspirated and flushed easily.  The reservoir was secured to the fascia with 1 sutures of 2-0 Prolene. A final check with fluoroscopy was done to make sure we had no kinks and good positioning of the tip of the catheter. Everything appeared to be okay. The catheter was aspirated, flushed with dilute heparin and then concentrated aqueous heparin.  The incision was then closed with interrupted 3-0 Vicryl, and 4-0 Monocryl subcuticular with Dermabond on the skin.  There were no operative complications. Estimated blood loss was minimal. All counts were correct. The patient tolerated the procedure well.  Turner Daniels, MD, FACS

## 2021-08-21 NOTE — Interval H&P Note (Signed)
History and Physical Interval Note:  08/21/2021 11:16 AM  Amber Mccormick  has presented today for surgery, with the diagnosis of POOR VENOUS ACCESS.  The various methods of treatment have been discussed with the patient and family. After consideration of risks, benefits and other options for treatment, the patient has consented to  Procedure(s): INSERTION PORT-A-CATH (N/A) as a surgical intervention.  The patient's history has been reviewed, patient examined, no change in status, stable for surgery.  I have reviewed the patient's chart and labs.  Questions were answered to the patient's satisfaction.     [place]

## 2021-08-21 NOTE — H&P (Signed)
History of Present Illness: Amber Mccormick is a 66 y.o. female who is seen today as an office consultation for evaluation of Breast Cancer .   Patient presents for evaluation of left breast mass upper outer quadrant. She noticed this about 2 months ago. She is brought to the attention of her provider and ultrasound revealed a 2 cm mass left breast upper outer quadrant with abnormal left axillar lymph node. Both were biopsied and found to be consistent with invasive ductal carcinoma grade 2 ER positive PR positive HER2/neu pending with a KIA I of 20%. She has no other complaints. No family history of breast cancer.  Review of Systems: A complete review of systems was obtained from the patient. I have reviewed this information and discussed as appropriate with the patient. See HPI as well for other ROS.    Medical History: Past Medical History:  Diagnosis Date   Anxiety   History of cancer   Thyroid disease   There is no problem list on file for this patient.  Past Surgical History:  Procedure Laterality Date   MOHS 1 STAGE HEAD/NECK/HAND/FEET/GENTIAL    No Known Allergies  Current Outpatient Medications on File Prior to Visit  Medication Sig Dispense Refill   levothyroxine (SYNTHROID) 50 MCG tablet TAKE 1 TABLET(50 MCG) BY MOUTH DAILY AT 6 AM   ascorbic acid (VITA-C ORAL) Take by mouth   Ca comb no.1/vit D3/B6/FA/B12 (VITAMIN D3, CALCIUM CIT-PHOS, ORAL) Take by mouth   cetirizine HCl (ALLERGY RELIEF, CETIRIZINE, ORAL) Take by mouth   cyanocobalamin (VITAMIN B-12) 500 MCG tablet Take 500 mcg by mouth once daily   metroNIDAZOLE (METROGEL) 0.75 % (37.81m/5 gram) vaginal gel Place 1 applicator vaginally 2 (two) times daily   No current facility-administered medications on file prior to visit.   Family History  Problem Relation Age of Onset   Pancreatic cancer Father    Social History   Tobacco Use  Smoking Status Never  Smokeless Tobacco Never    Social History    Socioeconomic History   Marital status: Married  Tobacco Use   Smoking status: Never   Smokeless tobacco: Never  Substance and Sexual Activity   Alcohol use: Never   Drug use: Never   Objective:   Vitals:  07/29/21 0844  BP: 136/80  Pulse: 96  Temp: 36.6 C (97.9 F)  SpO2: 96%  Weight: 72.9 kg (160 lb 12.8 oz)  Height: 167.6 cm (_0 )   Body mass index is 25.95 kg/m.  Physical Exam HENT:  Head: Normocephalic.  Cardiovascular:  Rate and Rhythm: Normal rate.  Chest:  Breasts: Right: Normal.   Comments: mobile left breast upper outer quadrant mass. 2 cm mobile left breast upper outer quadrant mass Musculoskeletal:  General: Normal range of motion.  Lymphadenopathy:  Upper Body:  Left upper body: Axillary adenopathy present.  Skin: General: Skin is warm.  Neurological:  Mental Status: She is alert.  Psychiatric:  Mood and Affect: Mood normal.  Behavior: Behavior normal.     Labs, Imaging and Diagnostic Testing: A. LEFT BREAST 2:30 O'CLOCK, 4 CM FROM NIPPLE, BIOPSY:   INVASIVE DUCTAL CARCINOMA, NOTTINGHAM HISTOLOGICAL GRADE 2, INTERMEDIATE GRADE (2/3 TUBULE FORMATION; 2/3 NUCLEAR GRADE: 2/3 MITOTIC ACTIVITY).   DUCTAL CARCINOMA IN SITU, DCIS, WITH ASSOCIATED MICROCALCIFICATIONS.   B. LEFT AXILLARY LYMPH NODE, BIOPSY:   LYMPH NODE WITH METASTATIC DUCTAL  IHC ER/PR HER2/NEU Report, Addendum  Prognostic Markers in Cancer   Block Number: A1   Results:   Estrogen Receptor:  Positive (3+, 99%)  Progesterone Receptor: Positive (3+, 95%)   Her2: 2+; FISH for HER2 pending; final results will be issued in a separate report   Ki-67: Percentage of tumor cells with nuclear positivity: 20 %        Block Number: B1   Results:   Estrogen Receptor: Positive (3+, 98%)  Progesterone Receptor: Positive (3+, 98%)   Her2: 2+; FISH for HER2 pending; final results will be issued in a separate report   Ki-67: Percentage of tumor cells with nuclear  positivity: 30 %   CARCINOMA.  07/01/2021 3:20 PM EDT   CLINICAL DATA:  Patient presents for a diagnostic left breast ultrasound as follow-up to a recent screening mammogram demonstrating a mass over the outer midportion of the left breast.  EXAM: ULTRASOUND OF THE LEFT BREAST  COMPARISON:  Recent screening mammogram 06/27/2021  FINDINGS: Targeted ultrasound is performed, showing a hypoechoic mass with irregular shape and borders at the 2:30 position of the left breast 4 cm from the nipple. The mass has internal microcalcifications and measures 1.6 x 2.0 x 2.0 cm.  Ultrasound of the left axilla demonstrates a single abnormal lymph node with replaced fatty hilum.  IMPRESSION: Suspicious 2 cm left breast mass over the 2:30 position. Single abnormal left axillary lymph node.  RECOMMENDATION: Recommend ultrasound-guided core needle biopsy of this suspicious left breast mass and abnormal left axillary lymph node.   Assessment and Plan:   Diagnoses and all orders for this visit:  Malignant neoplasm of upper-outer quadrant of left breast in female, estrogen receptor positive (CMS-HCC) - Ambulatory Referral to Oncology-Medical - Ambulatory Referral to Radiation Oncology - MRI breast bilateral with and without contrast; Future   Discussed care. Refer to medical oncology for consideration of neoadjuvant chemotherapy given positive node in 2 cm mass making her stage II. If not possible, can proceed with left breast lumpectomy with sentinel lymph node mapping and targeted left axillary lymph node biopsy. They like the idea of chemotherapy first if she is a candidate. Discussed port placement. Risks and benefits of port discussed including bleeding, infection, pneumothorax, hemothorax, catheter migration, catheter malfunction, the need for the treatment and or procedures.  No follow-ups on file.  Kennieth Francois, MD

## 2021-08-21 NOTE — Anesthesia Preprocedure Evaluation (Addendum)
Anesthesia Evaluation  Patient identified by MRN, date of birth, ID band Patient awake    Reviewed: Allergy & Precautions, NPO status , Patient's Chart, lab work & pertinent test results  Airway Mallampati: I  TM Distance: >3 FB Neck ROM: Full    Dental no notable dental hx. (+) Teeth Intact, Dental Advisory Given   Pulmonary neg pulmonary ROS,    Pulmonary exam normal breath sounds clear to auscultation       Cardiovascular Normal cardiovascular exam Rhythm:Regular Rate:Normal  HLD   Neuro/Psych PSYCHIATRIC DISORDERS Anxiety negative neurological ROS     GI/Hepatic negative GI ROS, Neg liver ROS,   Endo/Other  Hypothyroidism   Renal/GU negative Renal ROS  negative genitourinary   Musculoskeletal negative musculoskeletal ROS (+)   Abdominal   Peds  Hematology negative hematology ROS (+)   Anesthesia Other Findings   Reproductive/Obstetrics                            Anesthesia Physical Anesthesia Plan  ASA: 2  Anesthesia Plan: General   Post-op Pain Management: Tylenol PO (pre-op)*   Induction: Intravenous  PONV Risk Score and Plan: 3 and Midazolam, Ondansetron and Dexamethasone  Airway Management Planned: LMA  Additional Equipment:   Intra-op Plan:   Post-operative Plan: Extubation in OR  Informed Consent: I have reviewed the patients History and Physical, chart, labs and discussed the procedure including the risks, benefits and alternatives for the proposed anesthesia with the patient or authorized representative who has indicated his/her understanding and acceptance.     Dental advisory given  Plan Discussed with: Anesthesiologist and CRNA  Anesthesia Plan Comments:         Anesthesia Quick Evaluation

## 2021-08-21 NOTE — Transfer of Care (Signed)
Immediate Anesthesia Transfer of Care Note  Patient: Amber Mccormick  Procedure(s) Performed: INSERTION PORT-A-CATH (Right: Chest)  Patient Location: PACU  Anesthesia Type:General  Level of Consciousness: drowsy and patient cooperative  Airway & Oxygen Therapy: Patient Spontanous Breathing and Patient connected to face mask oxygen  Post-op Assessment: Report given to RN and Post -op Vital signs reviewed and stable  Post vital signs: Reviewed and stable  Last Vitals:  Vitals Value Taken Time  BP    Temp    Pulse 78 08/21/21 1232  Resp    SpO2 99 % 08/21/21 1232  Vitals shown include unvalidated device data.  Last Pain:  Vitals:   08/21/21 0952  TempSrc: Oral  PainSc: 0-No pain         Complications: No notable events documented.

## 2021-08-21 NOTE — Discharge Instructions (Addendum)
PORT-A-CATH: POST OP INSTRUCTIONS  Always review your discharge instruction sheet given to you by the facility where your surgery was performed.   A prescription for pain medication may be given to you upon discharge. Take your pain medication as prescribed, if needed. If narcotic pain medicine is not needed, then you make take acetaminophen (Tylenol) or ibuprofen (Advil) as needed.  Take your usually prescribed medications unless otherwise directed. If you need a refill on your pain medication, please contact our office. All narcotic pain medicine now requires a paper prescription.  Phoned in and fax refills are no longer allowed by law.  Prescriptions will not be filled after 5 pm or on weekends.  You should follow a light diet for the remainder of the day after your procedure. Most patients will experience some mild swelling and/or bruising in the area of the incision. It may take several days to resolve. It is common to experience some constipation if taking pain medication after surgery. Increasing fluid intake and taking a stool softener (such as Colace) will usually help or prevent this problem from occurring. A mild laxative (Milk of Magnesia or Miralax) should be taken according to package directions if there are no bowel movements after 48 hours.  Unless discharge instructions indicate otherwise, you may remove your bandages 48 hours after surgery, and you may shower at that time. You may have steri-strips (small white skin tapes) in place directly over the incision.  These strips should be left on the skin for 7-10 days.  If your surgeon used Dermabond (skin glue) on the incision, you may shower in 24 hours.  The glue will flake off over the next 2-3 weeks.  If your port is left accessed at the end of surgery (needle left in port), the dressing cannot get wet and should only by changed by a healthcare professional. When the port is no longer accessed (when the needle has been removed),  follow step 7.   ACTIVITIES:  Limit activity involving your arms for the next 72 hours. Do no strenuous exercise or activity for 1 week. You may drive when you are no longer taking prescription pain medication, you can comfortably wear a seatbelt, and you can maneuver your car. 10.You may need to see your doctor in the office for a follow-up appointment.  Please       check with your doctor.  11.When you receive a new Port-a-Cath, you will get a product guide and        ID card.  Please keep them in case you need them.  WHEN TO CALL YOUR DOCTOR [phone]): Fever over 101.0 Chills Continued bleeding from incision Increased redness and tenderness at the site Shortness of breath, difficulty breathing   The clinic staff is available to answer your questions during regular business hours. Please don't hesitate to call and ask to speak to one of the nurses or medical assistants for clinical concerns. If you have a medical emergency, go to the nearest emergency room or call 911.  A surgeon from [facility] is always on call at the hospital.     For further information, please visit [email or web address]  May take Tylenol after 4pm, if needed.   Post Anesthesia Home Care Instructions  Activity: Get plenty of rest for the remainder of the day. A responsible individual must stay with you for 24 hours following the procedure.  For the next 24 hours, DO NOT: -Drive a car -Paediatric nurse -Drink alcoholic beverages -Take  any medication unless instructed by your physician -Make any legal decisions or sign important papers.  Meals: Start with liquid foods such as gelatin or soup. Progress to regular foods as tolerated. Avoid greasy, spicy, heavy foods. If nausea and/or vomiting occur, drink only clear liquids until the nausea and/or vomiting subsides. Call your physician if vomiting continues.  Special Instructions/Symptoms: Your throat may feel dry or sore from the  anesthesia or the breathing tube placed in your throat during surgery. If this causes discomfort, gargle with warm salt water. The discomfort should disappear within 24 hours.  If you had a scopolamine patch placed behind your ear for the management of post- operative nausea and/or vomiting:  1. The medication in the patch is effective for 72 hours, after which it should be removed.  Wrap patch in a tissue and discard in the trash. Wash hands thoroughly with soap and water. 2. You may remove the patch earlier than 72 hours if you experience unpleasant side effects which may include dry mouth, dizziness or visual disturbances. 3. Avoid touching the patch. Wash your hands with soap and water after contact with the patch.

## 2021-08-21 NOTE — Anesthesia Procedure Notes (Signed)
Procedure Name: LMA Insertion Date/Time: 08/21/2021 11:43 AM Performed by: Glory Buff, CRNA Pre-anesthesia Checklist: Patient identified, Emergency Drugs available, Suction available and Patient being monitored Patient Re-evaluated:Patient Re-evaluated prior to induction Oxygen Delivery Method: Circle system utilized Preoxygenation: Pre-oxygenation with 100% oxygen Induction Type: IV induction LMA: LMA inserted LMA Size: 4.0 Number of attempts: 1 Placement Confirmation: positive ETCO2 Tube secured with: Tape Dental Injury: Teeth and Oropharynx as per pre-operative assessment

## 2021-08-22 ENCOUNTER — Inpatient Hospital Stay (HOSPITAL_BASED_OUTPATIENT_CLINIC_OR_DEPARTMENT_OTHER): Payer: Medicare Other | Admitting: Hematology and Oncology

## 2021-08-22 ENCOUNTER — Encounter: Payer: Self-pay | Admitting: *Deleted

## 2021-08-22 ENCOUNTER — Inpatient Hospital Stay: Payer: Medicare Other

## 2021-08-22 ENCOUNTER — Encounter (HOSPITAL_BASED_OUTPATIENT_CLINIC_OR_DEPARTMENT_OTHER): Payer: Self-pay | Admitting: Surgery

## 2021-08-22 ENCOUNTER — Other Ambulatory Visit (HOSPITAL_COMMUNITY): Payer: Medicare Other

## 2021-08-22 ENCOUNTER — Other Ambulatory Visit: Payer: Medicare Other

## 2021-08-22 ENCOUNTER — Ambulatory Visit: Payer: Medicare Other | Attending: Radiation Oncology | Admitting: Rehabilitation

## 2021-08-22 DIAGNOSIS — Z170 Estrogen receptor positive status [ER+]: Secondary | ICD-10-CM | POA: Insufficient documentation

## 2021-08-22 DIAGNOSIS — C50412 Malignant neoplasm of upper-outer quadrant of left female breast: Secondary | ICD-10-CM

## 2021-08-22 DIAGNOSIS — R293 Abnormal posture: Secondary | ICD-10-CM | POA: Diagnosis present

## 2021-08-22 DIAGNOSIS — Z95828 Presence of other vascular implants and grafts: Secondary | ICD-10-CM | POA: Insufficient documentation

## 2021-08-22 DIAGNOSIS — Z5112 Encounter for antineoplastic immunotherapy: Secondary | ICD-10-CM | POA: Diagnosis not present

## 2021-08-22 LAB — CMP (CANCER CENTER ONLY)
ALT: 12 U/L (ref 0–44)
AST: 17 U/L (ref 15–41)
Albumin: 4.3 g/dL (ref 3.5–5.0)
Alkaline Phosphatase: 66 U/L (ref 38–126)
Anion gap: 7 (ref 5–15)
BUN: 12 mg/dL (ref 8–23)
CO2: 29 mmol/L (ref 22–32)
Calcium: 9.2 mg/dL (ref 8.9–10.3)
Chloride: 105 mmol/L (ref 98–111)
Creatinine: 0.75 mg/dL (ref 0.44–1.00)
GFR, Estimated: >60 mL/min (ref >60)
Glucose, Bld: 120 mg/dL — ABNORMAL HIGH (ref 70–99)
Potassium: 4 mmol/L (ref 3.5–5.1)
Sodium: 141 mmol/L (ref 135–145)
Total Bilirubin: 0.4 mg/dL (ref 0.3–1.2)
Total Protein: 7.2 g/dL (ref 6.5–8.1)

## 2021-08-22 LAB — CBC WITH DIFFERENTIAL (CANCER CENTER ONLY)
Abs Immature Granulocytes: 0.05 10*3/uL (ref 0.00–0.07)
Basophils Absolute: 0 10*3/uL (ref 0.0–0.1)
Basophils Relative: 0 %
Eosinophils Absolute: 0 10*3/uL (ref 0.0–0.5)
Eosinophils Relative: 0 %
HCT: 39 % (ref 36.0–46.0)
Hemoglobin: 13.3 g/dL (ref 12.0–15.0)
Immature Granulocytes: 1 %
Lymphocytes Relative: 14 %
Lymphs Abs: 1.2 10*3/uL (ref 0.7–4.0)
MCH: 30.6 pg (ref 26.0–34.0)
MCHC: 34.1 g/dL (ref 30.0–36.0)
MCV: 89.9 fL (ref 80.0–100.0)
Monocytes Absolute: 0.2 10*3/uL (ref 0.1–1.0)
Monocytes Relative: 2 %
Neutro Abs: 7.5 10*3/uL (ref 1.7–7.7)
Neutrophils Relative %: 83 %
Platelet Count: 210 10*3/uL (ref 150–400)
RBC: 4.34 MIL/uL (ref 3.87–5.11)
RDW: 13.6 % (ref 11.5–15.5)
WBC Count: 9 10*3/uL (ref 4.0–10.5)
nRBC: 0 % (ref 0.0–0.2)

## 2021-08-22 MED ORDER — SODIUM CHLORIDE 0.9% FLUSH
10.0000 mL | Freq: Once | INTRAVENOUS | Status: AC
  Administered 2021-08-22: 10 mL

## 2021-08-22 MED ORDER — HEPARIN SOD (PORK) LOCK FLUSH 100 UNIT/ML IV SOLN
500.0000 [IU] | Freq: Once | INTRAVENOUS | Status: AC
  Administered 2021-08-22: 500 [IU]

## 2021-08-22 MED FILL — Dexamethasone Sodium Phosphate Inj 100 MG/10ML: INTRAMUSCULAR | Qty: 1 | Status: AC

## 2021-08-22 MED FILL — Fosaprepitant Dimeglumine For IV Infusion 150 MG (Base Eq): INTRAVENOUS | Qty: 5 | Status: AC

## 2021-08-22 NOTE — Therapy (Signed)
OUTPATIENT PHYSICAL THERAPY BREAST CANCER BASELINE EVALUATION   Patient Name: Amber Mccormick MRN: [id number] DOB:[birth year], 66 y.o., female Today's Date: 08/22/2021   PT End of Session - 08/22/21 0950     Visit Number 1    Number of Visits 2    Date for PT Re-Evaluation 11/14/21    PT Start Time 0850    PT Stop Time 0925    PT Time Calculation (min) 35 min    Activity Tolerance Patient tolerated treatment well    Behavior During Therapy [facility] for tasks assessed/performed             Past Medical History:  Diagnosis Date   Anxiety    Cancer ([place]) 05/2021   right breast IDC, + lymph node   Hyperlipidemia    Hypothyroidism    Past Surgical History:  Procedure Laterality Date   MOHS SURGERY     on nose   PORTACATH PLACEMENT Right 08/21/2021   Procedure: INSERTION PORT-A-CATH;  Surgeon: Erroll Luna, MD;  Location: [place];  Service: General;  Laterality: Right;   TUBAL LIGATION     Patient Active Problem List   Diagnosis Date Noted   Malignant neoplasm of upper-outer quadrant of left breast, estrogen receptor positive ([place]) 08/08/2021   Malignant neoplasm of upper-outer quadrant of left breast in female, estrogen receptor positive ([place]) 08/08/2021    PCP: Linward Natal PA-C  REFERRING PROVIDER: Shona Simpson PA-C  REFERRING DIAG: left breast cancer  THERAPY DIAG:  Malignant neoplasm of upper-outer quadrant of left breast in female, estrogen receptor positive ([place]) - Plan: PT plan of care cert/re-cert  Abnormal posture - Plan: PT plan of care cert/re-cert  Rationale for Evaluation and Treatment Rehabilitation  ONSET DATE: 07/29/21  SUBJECTIVE                                                                                                                                                                                           SUBJECTIVE STATEMENT: Patient reports she is here today to be seen by her medical team for her newly diagnosed left  breast cancer.   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  The lymph node biopsy was also positive. Will be having neoadjuvant chemotherapy and then lumpectomy and targeted ax dissection.  Sx date unknown.   PATIENT GOALS   reduce lymphedema risk and learn post op HEP.   PAIN:  Are you having pain? No   PRECAUTIONS: Active CA   HAND DOMINANCE:  right  WEIGHT BEARING RESTRICTIONS No  FALLS:  Has patient fallen in last 6 months? No  LIVING ENVIRONMENT: Patient lives with: husband   OCCUPATION: retired  LEISURE: playing with grandchildren, walking dog   PRIOR LEVEL OF FUNCTION: Independent   OBJECTIVE COGNITION:  Overall cognitive status: Within functional limits for tasks assessed    POSTURE:  Forward head and rounded shoulders posture  UPPER EXTREMITY AROM/PROM:  A/PROM LEFT   eval  Shoulder extension 65  Shoulder flexion 157  Shoulder abduction 157  Shoulder internal rotation   Shoulder external rotation 90    (Blank rows = not tested)  CERVICAL AROM: All within normal limits:   LYMPHEDEMA ASSESSMENTS:  LANDMARK RIGHT   eval  10 cm proximal to olecranon process 30.5  Olecranon process 25.3  10 cm proximal to ulnar styloid process 20.2  Just proximal to ulnar styloid process 18.5  Across hand at thumb web space 19  At base of 2nd digit 6.2  (Blank rows = not tested)  LANDMARK LEFT   eval  10 cm proximal to olecranon process 30.5  Olecranon process 27  10 cm proximal to ulnar styloid process 20.3  Just proximal to ulnar styloid process 18.5  Across hand at thumb web space 19  At base of 2nd digit 6.5  (Blank rows = not tested)   L-DEX LYMPHEDEMA SCREENING:  The patient was assessed using the L-Dex machine today to produce a lymphedema index baseline score. The patient will be reassessed on a regular basis  (typically every 3 months) to obtain new L-Dex scores. If the score is > 6.5 points away from his/her baseline score indicating onset of subclinical lymphedema, it will be recommended to wear a compression garment for 4 weeks, 12 hours per day and then be reassessed. If the score continues to be > 6.5 points from baseline at reassessment, we will initiate lymphedema treatment. Assessing in this manner has a 95% rate of preventing clinically significant lymphedema.  QUICK DASH SURVEY:0%  PATIENT EDUCATION:  Education details: Lymphedema risk reduction and post op shoulder/posture HEP Person educated: Patient Education method: Explanation, Demonstration, Handout Education comprehension: Patient verbalized understanding and returned demonstration  HOME EXERCISE PROGRAM: Patient was instructed today in a home exercise program today for post op shoulder range of motion. These included active assist shoulder flexion in sitting, scapular retraction, wall walking with shoulder abduction, and hands behind head external rotation.  She was encouraged to do these twice a day, holding 3 seconds and repeating 5 times when permitted by her physician.   ASSESSMENT:  CLINICAL IMPRESSION: Pt is planning to have neoadjuvant chemotherapy followed by lumpectomy and targeted biopsy then radiation. She will benefit from a post op PT reassessment to determine needs and from L-Dex screens every 3 months for 2 years to detect subclinical lymphedema.  Pt will benefit from skilled therapeutic intervention to improve on the following deficits: Decreased knowledge of precautions, impaired UE functional use, pain, decreased ROM, postural dysfunction.   PT treatment/interventions: ADL/self-care home management, pt/family education, therapeutic exercise  REHAB POTENTIAL: Excellent  CLINICAL DECISION MAKING: Stable/uncomplicated  EVALUATION COMPLEXITY: Low   GOALS: Goals reviewed with patient? YES  LONG TERM GOALS:  (STG=LTG)    Name Target Date Goal status  1 Pt will be able to verbalize understanding of pertinent lymphedema risk reduction practices relevant to her dx specifically related to skin care.  Baseline:  No knowledge 08/22/2021 Achieved at eval  2 Pt will be able to return demo and/or  verbalize understanding of the post op HEP related to regaining shoulder ROM. Baseline:  No knowledge 08/22/2021 Achieved at eval  3 Pt will be able to verbalize understanding of the importance of attending the post op After Breast CA Class for further lymphedema risk reduction education and therapeutic exercise.  Baseline:  No knowledge 08/22/2021 Achieved at eval  4 Pt will demo she has regained full shoulder ROM and function post operatively compared to baselines.  Baseline: See objective measurements taken today. 11/14/21      PLAN: PT FREQUENCY/DURATION: EVAL and 1 follow up appointment.   PLAN FOR NEXT SESSION: will reassess 3-4 weeks post op to determine needs.   Patient will follow up at outpatient cancer rehab 3-4 weeks following surgery.  If the patient requires physical therapy at that time, a specific plan will be dictated and sent to the referring physician for approval. The patient was educated today on appropriate basic range of motion exercises to begin post operatively and the importance of attending the After Breast Cancer class following surgery.  Patient was educated today on lymphedema risk reduction practices as it pertains to recommendations that will benefit the patient immediately following surgery.  She verbalized good understanding.    Physical Therapy Information for After Breast Cancer Surgery/Treatment:  Lymphedema is a swelling condition that you may be at risk for in your arm if you have lymph nodes removed from the armpit area.  After a sentinel node biopsy, the risk is approximately 5-9% and is higher after an axillary node dissection.  There is treatment available for this condition  and it is not life-threatening.  Contact your physician or physical therapist with concerns. You may begin the 4 shoulder/posture exercises (see additional sheet) when permitted by your physician (typically a week after surgery).  If you have drains, you may need to wait until those are removed before beginning range of motion exercises.  A general recommendation is to not lift your arms above shoulder height until drains are removed.  These exercises should be done to your tolerance and gently.  This is not a "no pain/no gain" type of recovery so listen to your body and stretch into the range of motion that you can tolerate, stopping if you have pain.  If you are having immediate reconstruction, ask your plastic surgeon about doing exercises as he or she may want you to wait. We encourage you to attend the free one time ABC (After Breast Cancer) class offered by [facility].  You will learn information related to lymphedema risk, prevention and treatment and additional exercises to regain mobility following surgery.  You can call [phone] for more information.  This is offered the 1st and 3rd Monday of each month.  You only attend the class one time. While undergoing any medical procedure or treatment, try to avoid blood pressure being taken or needle sticks from occurring on the arm on the side of cancer.   This recommendation begins after surgery and continues for the rest of your life.  This may help reduce your risk of getting lymphedema (swelling in your arm). An excellent resource for those seeking information on lymphedema is the National Lymphedema Network's web site. It can be accessed at [place].org If you notice swelling in your hand, arm or breast at any time following surgery (even if it is many years from now), please contact your doctor or physical therapist to discuss this.  Lymphedema can be treated at any time but it  is easier for you if it is treated early  on.  If you feel like your shoulder motion is not returning to normal in a reasonable amount of time, please contact your surgeon or physical therapist.  [facility] [phone]. [address], [place] [address]  ABC CLASS After Breast Cancer Class  After Breast Cancer Class is a specially designed exercise class to assist you in a safe recover after having breast cancer surgery.  In this class you will learn how to get back to full function whether your drains were just removed or if you had surgery a month ago.  This one-time class is held the 1st and 3rd Monday of every month from 11:00 a.m. until 12:00 noon virtually.  This class is FREE and space is limited. For more information or to register for the next available class, call [phone].  Class Goals  Understand specific stretches to improve the flexibility of you chest and shoulder. Learn ways to safely strengthen your upper body and improve your posture. Understand the warning signs of infection and why you may be at risk for an arm infection. Learn about Lymphedema and prevention.  ** You do not attend this class until after surgery.  Drains must be removed to participate  Patient was instructed today in a home exercise program today for post op shoulder range of motion. These included active assist shoulder flexion in sitting, scapular retraction, wall walking with shoulder abduction, and hands behind head external rotation.  She was encouraged to do these twice a day, holding 3 seconds and repeating 5 times when permitted by her physician.    Stark Bray, PT 08/22/2021, 9:51 AM

## 2021-08-22 NOTE — Anesthesia Postprocedure Evaluation (Signed)
Anesthesia Post Note  Patient: Amber Mccormick  Procedure(s) Performed: INSERTION PORT-A-CATH (Right: Chest)     Patient location during evaluation: PACU Anesthesia Type: General Level of consciousness: awake and alert Pain management: pain level controlled Vital Signs Assessment: post-procedure vital signs reviewed and stable Respiratory status: spontaneous breathing, nonlabored ventilation, respiratory function stable and patient connected to nasal cannula oxygen Cardiovascular status: blood pressure returned to baseline and stable Postop Assessment: no apparent nausea or vomiting Anesthetic complications: no   No notable events documented.  Last Vitals:  Vitals:   08/21/21 1300 08/21/21 1316  BP: 135/73 140/77  Pulse: 75 84  Resp: (!) 23 16  Temp:  36.4 C  SpO2: 95% 96%    Last Pain:  Vitals:   08/22/21 1039  TempSrc:   PainSc: 1                  Cadie Sorci L Jaylnn Ullery

## 2021-08-22 NOTE — Assessment & Plan Note (Signed)
07/01/2021:Screening mammogram 06/27/2021 detected abnormality in the left breast which led to diagnostic mammogram and left breast ultrasound that revealed hypoechoic mass with irregular borders 2:30 position 2 cm left axilla: 1 abnormal lymph node, grade 2 IDC with DCIS ER 99%, PR 95%, HER2 2+ by IHC, FISH positive, Ki-67 20%, left axilla lymph node: Positive   Treatment plan: 1. Neoadjuvant chemotherapy with TCHP x6 followed by HP maintenance versus Kadcyla maintenance 2. Followed by breast conserving surgery with targeted axillary dissection 3. Followed by adjuvant radiation therapy 4.  Followed by adjuvant antiestrogen therapy -------------------------------------------------------------------------------------------------------------------------------------- Current treatment: Cycle 1 day 1 TCHP Chemo consent obtained, chemo education completed, labs reviewed, antiemetics were reviewed Echocardiogram 08/03/2021: EF 65 to 70%  Return to clinic in 1 week for toxicity check

## 2021-08-23 ENCOUNTER — Inpatient Hospital Stay: Payer: Medicare Other

## 2021-08-23 ENCOUNTER — Other Ambulatory Visit: Payer: Self-pay | Admitting: Hematology and Oncology

## 2021-08-23 ENCOUNTER — Other Ambulatory Visit: Payer: Self-pay

## 2021-08-23 VITALS — BP 124/74 | HR 74 | Temp 98.4°F | Resp 16

## 2021-08-23 DIAGNOSIS — Z170 Estrogen receptor positive status [ER+]: Secondary | ICD-10-CM

## 2021-08-23 DIAGNOSIS — Z5112 Encounter for antineoplastic immunotherapy: Secondary | ICD-10-CM | POA: Diagnosis not present

## 2021-08-23 MED ORDER — DIPHENHYDRAMINE HCL 25 MG PO CAPS
50.0000 mg | ORAL_CAPSULE | Freq: Once | ORAL | Status: AC
  Administered 2021-08-23: 50 mg via ORAL
  Filled 2021-08-23: qty 2

## 2021-08-23 MED ORDER — SODIUM CHLORIDE 0.9 % IV SOLN
50.0000 mg/m2 | Freq: Once | INTRAVENOUS | Status: AC
  Administered 2021-08-23: 90 mg via INTRAVENOUS
  Filled 2021-08-23: qty 9

## 2021-08-23 MED ORDER — SODIUM CHLORIDE 0.9 % IV SOLN
420.0000 mg | Freq: Once | INTRAVENOUS | Status: AC
  Administered 2021-08-23: 420 mg via INTRAVENOUS
  Filled 2021-08-23: qty 14

## 2021-08-23 MED ORDER — HEPARIN SOD (PORK) LOCK FLUSH 100 UNIT/ML IV SOLN
500.0000 [IU] | Freq: Once | INTRAVENOUS | Status: AC | PRN
  Administered 2021-08-23: 500 [IU]

## 2021-08-23 MED ORDER — SODIUM CHLORIDE 0.9 % IV SOLN: Freq: Once | INTRAVENOUS | Status: AC

## 2021-08-23 MED ORDER — TRASTUZUMAB-ANNS CHEMO 150 MG IV SOLR
8.0000 mg/kg | Freq: Once | INTRAVENOUS | Status: AC
  Administered 2021-08-23: 567 mg via INTRAVENOUS
  Filled 2021-08-23: qty 27

## 2021-08-23 MED ORDER — SODIUM CHLORIDE 0.9 % IV SOLN
150.0000 mg | Freq: Once | INTRAVENOUS | Status: AC
  Administered 2021-08-23: 150 mg via INTRAVENOUS
  Filled 2021-08-23: qty 150

## 2021-08-23 MED ORDER — SODIUM CHLORIDE 0.9 % IV SOLN: Freq: Once | INTRAVENOUS | Status: DC

## 2021-08-23 MED ORDER — ACETAMINOPHEN 325 MG PO TABS
650.0000 mg | ORAL_TABLET | Freq: Once | ORAL | Status: AC
  Administered 2021-08-23: 650 mg via ORAL
  Filled 2021-08-23: qty 2

## 2021-08-23 MED ORDER — SODIUM CHLORIDE 0.9 % IV SOLN
10.0000 mg | Freq: Once | INTRAVENOUS | Status: AC
  Administered 2021-08-23: 10 mg via INTRAVENOUS
  Filled 2021-08-23: qty 10

## 2021-08-23 MED ORDER — SODIUM CHLORIDE 0.9 % IV SOLN
347.2000 mg | Freq: Once | INTRAVENOUS | Status: AC
  Administered 2021-08-23: 350 mg via INTRAVENOUS
  Filled 2021-08-23: qty 35

## 2021-08-23 MED ORDER — PALONOSETRON HCL INJECTION 0.25 MG/5ML
0.2500 mg | Freq: Once | INTRAVENOUS | Status: AC
  Administered 2021-08-23: 0.25 mg via INTRAVENOUS
  Filled 2021-08-23: qty 5

## 2021-08-23 MED ORDER — SODIUM CHLORIDE 0.9% FLUSH
10.0000 mL | INTRAVENOUS | Status: DC | PRN
  Administered 2021-08-23: 10 mL

## 2021-08-23 NOTE — Patient Instructions (Signed)
[facility]  Discharge Instructions: Thank you for choosing [facility] to provide your oncology and hematology care.   If you have a lab appointment with the [facility], please go directly to the Mobile and check in at the registration area.   Wear comfortable clothing and clothing appropriate for easy access to any Portacath or PICC line.   We strive to give you quality time with your provider. You may need to reschedule your appointment if you arrive late (15 or more minutes).  Arriving late affects you and other patients whose appointments are after yours.  Also, if you miss three or more appointments without notifying the office, you may be dismissed from the clinic at the provider's discretion.      For prescription refill requests, have your pharmacy contact our office and allow 72 hours for refills to be completed.    Today you received the following chemotherapy and/or immunotherapy agents: Kanjinti/Perjeta/Docetaxel/Carboplatin      To help prevent nausea and vomiting after your treatment, we encourage you to take your nausea medication as directed.  BELOW ARE SYMPTOMS THAT SHOULD BE REPORTED IMMEDIATELY: *FEVER GREATER THAN 100.4 F (38 C) OR HIGHER *CHILLS OR SWEATING *NAUSEA AND VOMITING THAT IS NOT CONTROLLED WITH YOUR NAUSEA MEDICATION *UNUSUAL SHORTNESS OF BREATH *UNUSUAL BRUISING OR BLEEDING *URINARY PROBLEMS (pain or burning when urinating, or frequent urination) *BOWEL PROBLEMS (unusual diarrhea, constipation, pain near the anus) TENDERNESS IN MOUTH AND THROAT WITH OR WITHOUT PRESENCE OF ULCERS (sore throat, sores in mouth, or a toothache) UNUSUAL RASH, SWELLING OR PAIN  UNUSUAL VAGINAL DISCHARGE OR ITCHING   Items with * indicate a potential emergency and should be followed up as soon as possible or go to the Emergency Department if any problems should occur.  Please show the CHEMOTHERAPY ALERT CARD or  IMMUNOTHERAPY ALERT CARD at check-in to the Emergency Department and triage nurse.  Should you have questions after your visit or need to cancel or reschedule your appointment, please contact [facility]  Dept: [phone]  and follow the prompts.  Office hours are 8:00 a.m. to 4:30 p.m. Monday - Friday. Please note that voicemails left after 4:00 p.m. may not be returned until the following business day.  We are closed weekends and major holidays. You have access to a nurse at all times for urgent questions. Please call the main number to the clinic Dept: [phone] and follow the prompts.   For any non-urgent questions, you may also contact your provider using MyChart. We now offer e-Visits for anyone 24 and older to request care online for non-urgent symptoms. For details visit [email or web address].   Also download the MyChart app! Go to the app store, search "MyChart", open the app, select [facility], and log in with your MyChart username and password.  Due to Covid, a mask is required upon entering the hospital/clinic. If you do not have a mask, one will be given to you upon arrival. For doctor visits, patients may have 1 support person aged 86 or older with them. For treatment visits, patients cannot have anyone with them due to current Covid guidelines and our immunocompromised population.   Trastuzumab injection for infusion What is this medication? TRASTUZUMAB (tras TOO zoo mab) is a monoclonal antibody. It is used to treat breast cancer and stomach cancer. This medicine may be used for other purposes; ask your health care provider or pharmacist if you have questions. COMMON BRAND NAME(S):  Herceptin, Donnald Garre What should I tell my care team before I take this medication? They need to know if you have any of these conditions: heart disease heart failure lung or breathing disease, like asthma an unusual or  allergic reaction to trastuzumab, benzyl alcohol, or other medications, foods, dyes, or preservatives pregnant or trying to get pregnant breast-feeding How should I use this medication? This drug is given as an infusion into a vein. It is administered in a hospital or clinic by a specially trained health care professional. Talk to your pediatrician regarding the use of this medicine in children. This medicine is not approved for use in children. Overdosage: If you think you have taken too much of this medicine contact a poison control center or emergency room at once. NOTE: This medicine is only for you. Do not share this medicine with others. What if I miss a dose? It is important not to miss a dose. Call your doctor or health care professional if you are unable to keep an appointment. What may interact with this medication? This medicine may interact with the following medications: certain types of chemotherapy, such as daunorubicin, doxorubicin, epirubicin, and idarubicin This list may not describe all possible interactions. Give your health care provider a list of all the medicines, herbs, non-prescription drugs, or dietary supplements you use. Also tell them if you smoke, drink alcohol, or use illegal drugs. Some items may interact with your medicine. What should I watch for while using this medication? Visit your doctor for checks on your progress. Report any side effects. Continue your course of treatment even though you feel ill unless your doctor tells you to stop. Call your doctor or health care professional for advice if you get a fever, chills or sore throat, or other symptoms of a cold or flu. Do not treat yourself. Try to avoid being around people who are sick. You may experience fever, chills and shaking during your first infusion. These effects are usually mild and can be treated with other medicines. Report any side effects during the infusion to your health care professional. Fever  and chills usually do not happen with later infusions. Do not become pregnant while taking this medicine or for 7 months after stopping it. Women should inform their doctor if they wish to become pregnant or think they might be pregnant. Women of child-bearing potential will need to have a negative pregnancy test before starting this medicine. There is a potential for serious side effects to an unborn child. Talk to your health care professional or pharmacist for more information. Do not breast-feed an infant while taking this medicine or for 7 months after stopping it. Women must use effective birth control with this medicine. What side effects may I notice from receiving this medication? Side effects that you should report to your doctor or health care professional as soon as possible: allergic reactions like skin rash, itching or hives, swelling of the face, lips, or tongue chest pain or palpitations cough dizziness feeling faint or lightheaded, falls fever general ill feeling or flu-like symptoms signs of worsening heart failure like breathing problems; swelling in your legs and feet unusually weak or tired Side effects that usually do not require medical attention (report to your doctor or health care professional if they continue or are bothersome): bone pain changes in taste diarrhea joint pain nausea/vomiting weight loss This list may not describe all possible side effects. Call your doctor for medical advice about side effects.  You may report side effects to FDA at 1-800-FDA-1088. Where should I keep my medication? This drug is given in a hospital or clinic and will not be stored at home. NOTE: This sheet is a summary. It may not cover all possible information. If you have questions about this medicine, talk to your doctor, pharmacist, or health care provider.  2023 Elsevier/Gold Standard (2016-04-01 00:00:00)  Pertuzumab injection What is this medication? PERTUZUMAB (per TOOZ  ue mab) is a monoclonal antibody. It is used to treat breast cancer. This medicine may be used for other purposes; ask your health care provider or pharmacist if you have questions. COMMON BRAND NAME(S): PERJETA What should I tell my care team before I take this medication? They need to know if you have any of these conditions: heart disease heart failure high blood pressure history of irregular heart beat recent or ongoing radiation therapy an unusual or allergic reaction to pertuzumab, other medicines, foods, dyes, or preservatives pregnant or trying to get pregnant breast-feeding How should I use this medication? This medicine is for infusion into a vein. It is given by a health care professional in a hospital or clinic setting. Talk to your pediatrician regarding the use of this medicine in children. Special care may be needed. Overdosage: If you think you have taken too much of this medicine contact a poison control center or emergency room at once. NOTE: This medicine is only for you. Do not share this medicine with others. What if I miss a dose? It is important not to miss your dose. Call your doctor or health care professional if you are unable to keep an appointment. What may interact with this medication? Interactions are not expected. Give your health care provider a list of all the medicines, herbs, non-prescription drugs, or dietary supplements you use. Also tell them if you smoke, drink alcohol, or use illegal drugs. Some items may interact with your medicine. This list may not describe all possible interactions. Give your health care provider a list of all the medicines, herbs, non-prescription drugs, or dietary supplements you use. Also tell them if you smoke, drink alcohol, or use illegal drugs. Some items may interact with your medicine. What should I watch for while using this medication? Your condition will be monitored carefully while you are receiving this medicine.  Report any side effects. Continue your course of treatment even though you feel ill unless your doctor tells you to stop. Do not become pregnant while taking this medicine or for 7 months after stopping it. Women should inform their doctor if they wish to become pregnant or think they might be pregnant. Women of child-bearing potential will need to have a negative pregnancy test before starting this medicine. There is a potential for serious side effects to an unborn child. Talk to your health care professional or pharmacist for more information. Do not breast-feed an infant while taking this medicine or for 7 months after stopping it. Women must use effective birth control with this medicine. Call your doctor or health care professional for advice if you get a fever, chills or sore throat, or other symptoms of a cold or flu. Do not treat yourself. Try to avoid being around people who are sick. You may experience fever, chills, and headache during the infusion. Report any side effects during the infusion to your health care professional. What side effects may I notice from receiving this medication? Side effects that you should report to your doctor or health care professional  as soon as possible: breathing problems chest pain or palpitations dizziness feeling faint or lightheaded fever or chills skin rash, itching or hives sore throat swelling of the face, lips, or tongue swelling of the legs or ankles unusually weak or tired Side effects that usually do not require medical attention (report to your doctor or health care professional if they continue or are bothersome): diarrhea hair loss nausea, vomiting tiredness This list may not describe all possible side effects. Call your doctor for medical advice about side effects. You may report side effects to FDA at 1-800-FDA-1088. Where should I keep my medication? This drug is given in a hospital or clinic and will not be stored at home. NOTE:  This sheet is a summary. It may not cover all possible information. If you have questions about this medicine, talk to your doctor, pharmacist, or health care provider.  2023 Elsevier/Gold Standard (2015-04-19 00:00:00)  Docetaxel injection What is this medication? DOCETAXEL (doe se TAX el) is a chemotherapy drug. It targets fast dividing cells, like cancer cells, and causes these cells to die. This medicine is used to treat many types of cancers like breast cancer, certain stomach cancers, head and neck cancer, lung cancer, and prostate cancer. This medicine may be used for other purposes; ask your health care provider or pharmacist if you have questions. COMMON BRAND NAME(S): Docefrez, Taxotere What should I tell my care team before I take this medication? They need to know if you have any of these conditions: infection (especially a virus infection such as chickenpox, cold sores, or herpes) liver disease low blood counts, like low white cell, platelet, or red cell counts an unusual or allergic reaction to docetaxel, polysorbate 80, other chemotherapy agents, other medicines, foods, dyes, or preservatives pregnant or trying to get pregnant breast-feeding How should I use this medication? This drug is given as an infusion into a vein. It is administered in a hospital or clinic by a specially trained health care professional. Talk to your pediatrician regarding the use of this medicine in children. Special care may be needed. Overdosage: If you think you have taken too much of this medicine contact a poison control center or emergency room at once. NOTE: This medicine is only for you. Do not share this medicine with others. What if I miss a dose? It is important not to miss your dose. Call your doctor or health care professional if you are unable to keep an appointment. What may interact with this medication? Do not take this medicine with any of the following medications: live virus  vaccines This medicine may also interact with the following medications: aprepitant certain antibiotics like erythromycin or clarithromycin certain antivirals for HIV or hepatitis certain medicines for fungal infections like fluconazole, itraconazole, ketoconazole, posaconazole, or voriconazole cimetidine ciprofloxacin conivaptan cyclosporine dronedarone fluvoxamine grapefruit juice imatinib verapamil This list may not describe all possible interactions. Give your health care provider a list of all the medicines, herbs, non-prescription drugs, or dietary supplements you use. Also tell them if you smoke, drink alcohol, or use illegal drugs. Some items may interact with your medicine. What should I watch for while using this medication? Your condition will be monitored carefully while you are receiving this medicine. You will need important blood work done while you are taking this medicine. Call your doctor or health care professional for advice if you get a fever, chills or sore throat, or other symptoms of a cold or flu. Do not treat yourself. This drug decreases  your body's ability to fight infections. Try to avoid being around people who are sick. Some products may contain alcohol. Ask your health care professional if this medicine contains alcohol. Be sure to tell all health care professionals you are taking this medicine. Certain medicines, like metronidazole and disulfiram, can cause an unpleasant reaction when taken with alcohol. The reaction includes flushing, headache, nausea, vomiting, sweating, and increased thirst. The reaction can last from 30 minutes to several hours. You may get drowsy or dizzy. Do not drive, use machinery, or do anything that needs mental alertness until you know how this medicine affects you. Do not stand or sit up quickly, especially if you are an older patient. This reduces the risk of dizzy or fainting spells. Alcohol may interfere with the effect of this  medicine. Talk to your health care professional about your risk of cancer. You may be more at risk for certain types of cancer if you take this medicine. Do not become pregnant while taking this medicine or for 6 months after stopping it. Women should inform their doctor if they wish to become pregnant or think they might be pregnant. There is a potential for serious side effects to an unborn child. Talk to your health care professional or pharmacist for more information. Do not breast-feed an infant while taking this medicine or for 1 week after stopping it. Males who get this medicine must use a condom during sex with females who can get pregnant. If you get a woman pregnant, the baby could have birth defects. The baby could die before they are born. You will need to continue wearing a condom for 3 months after stopping the medicine. Tell your health care provider right away if your partner becomes pregnant while you are taking this medicine. This may interfere with the ability to father a child. You should talk to your doctor or health care professional if you are concerned about your fertility. What side effects may I notice from receiving this medication? Side effects that you should report to your doctor or health care professional as soon as possible: allergic reactions like skin rash, itching or hives, swelling of the face, lips, or tongue blurred vision breathing problems changes in vision low blood counts - This drug may decrease the number of white blood cells, red blood cells and platelets. You may be at increased risk for infections and bleeding. nausea and vomiting pain, redness or irritation at site where injected pain, tingling, numbness in the hands or feet redness, blistering, peeling, or loosening of the skin, including inside the mouth signs of decreased platelets or bleeding - bruising, pinpoint red spots on the skin, black, tarry stools, nosebleeds signs of decreased red blood  cells - unusually weak or tired, fainting spells, lightheadedness signs of infection - fever or chills, cough, sore throat, pain or difficulty passing urine swelling of the ankle, feet, hands Side effects that usually do not require medical attention (report to your doctor or health care professional if they continue or are bothersome): constipation diarrhea fingernail or toenail changes hair loss loss of appetite mouth sores muscle pain This list may not describe all possible side effects. Call your doctor for medical advice about side effects. You may report side effects to FDA at 1-800-FDA-1088. Where should I keep my medication? This drug is given in a hospital or clinic and will not be stored at home. NOTE: This sheet is a summary. It may not cover all possible information. If you have questions  about this medicine, talk to your doctor, pharmacist, or health care provider.  2023 Elsevier/Gold Standard (2021-02-15 00:00:00)  Carboplatin injection What is this medication? CARBOPLATIN (KAR boe pla tin) is a chemotherapy drug. It targets fast dividing cells, like cancer cells, and causes these cells to die. This medicine is used to treat ovarian cancer and many other cancers. This medicine may be used for other purposes; ask your health care provider or pharmacist if you have questions. COMMON BRAND NAME(S): Paraplatin What should I tell my care team before I take this medication? They need to know if you have any of these conditions: blood disorders hearing problems kidney disease recent or ongoing radiation therapy an unusual or allergic reaction to carboplatin, cisplatin, other chemotherapy, other medicines, foods, dyes, or preservatives pregnant or trying to get pregnant breast-feeding How should I use this medication? This drug is usually given as an infusion into a vein. It is administered in a hospital or clinic by a specially trained health care professional. Talk to your  pediatrician regarding the use of this medicine in children. Special care may be needed. Overdosage: If you think you have taken too much of this medicine contact a poison control center or emergency room at once. NOTE: This medicine is only for you. Do not share this medicine with others. What if I miss a dose? It is important not to miss a dose. Call your doctor or health care professional if you are unable to keep an appointment. What may interact with this medication? medicines for seizures medicines to increase blood counts like filgrastim, pegfilgrastim, sargramostim some antibiotics like amikacin, gentamicin, neomycin, streptomycin, tobramycin vaccines Talk to your doctor or health care professional before taking any of these medicines: acetaminophen aspirin ibuprofen ketoprofen naproxen This list may not describe all possible interactions. Give your health care provider a list of all the medicines, herbs, non-prescription drugs, or dietary supplements you use. Also tell them if you smoke, drink alcohol, or use illegal drugs. Some items may interact with your medicine. What should I watch for while using this medication? Your condition will be monitored carefully while you are receiving this medicine. You will need important blood work done while you are taking this medicine. This drug may make you feel generally unwell. This is not uncommon, as chemotherapy can affect healthy cells as well as cancer cells. Report any side effects. Continue your course of treatment even though you feel ill unless your doctor tells you to stop. In some cases, you may be given additional medicines to help with side effects. Follow all directions for their use. Call your doctor or health care professional for advice if you get a fever, chills or sore throat, or other symptoms of a cold or flu. Do not treat yourself. This drug decreases your body's ability to fight infections. Try to avoid being around people  who are sick. This medicine may increase your risk to bruise or bleed. Call your doctor or health care professional if you notice any unusual bleeding. Be careful brushing and flossing your teeth or using a toothpick because you may get an infection or bleed more easily. If you have any dental work done, tell your dentist you are receiving this medicine. Avoid taking products that contain aspirin, acetaminophen, ibuprofen, naproxen, or ketoprofen unless instructed by your doctor. These medicines may hide a fever. Do not become pregnant while taking this medicine. Women should inform their doctor if they wish to become pregnant or think they might be pregnant.  There is a potential for serious side effects to an unborn child. Talk to your health care professional or pharmacist for more information. Do not breast-feed an infant while taking this medicine. What side effects may I notice from receiving this medication? Side effects that you should report to your doctor or health care professional as soon as possible: allergic reactions like skin rash, itching or hives, swelling of the face, lips, or tongue signs of infection - fever or chills, cough, sore throat, pain or difficulty passing urine signs of decreased platelets or bleeding - bruising, pinpoint red spots on the skin, black, tarry stools, nosebleeds signs of decreased red blood cells - unusually weak or tired, fainting spells, lightheadedness breathing problems changes in hearing changes in vision chest pain high blood pressure low blood counts - This drug may decrease the number of white blood cells, red blood cells and platelets. You may be at increased risk for infections and bleeding. nausea and vomiting pain, swelling, redness or irritation at the injection site pain, tingling, numbness in the hands or feet problems with balance, talking, walking trouble passing urine or change in the amount of urine Side effects that usually do not  require medical attention (report to your doctor or health care professional if they continue or are bothersome): hair loss loss of appetite metallic taste in the mouth or changes in taste This list may not describe all possible side effects. Call your doctor for medical advice about side effects. You may report side effects to FDA at 1-800-FDA-1088. Where should I keep my medication? This drug is given in a hospital or clinic and will not be stored at home. NOTE: This sheet is a summary. It may not cover all possible information. If you have questions about this medicine, talk to your doctor, pharmacist, or health care provider.  2023 Elsevier/Gold Standard (2007-08-25 00:00:00)

## 2021-08-27 ENCOUNTER — Inpatient Hospital Stay: Payer: Medicare Other

## 2021-08-27 ENCOUNTER — Encounter: Payer: Self-pay | Admitting: Hematology and Oncology

## 2021-08-27 ENCOUNTER — Other Ambulatory Visit: Payer: Self-pay

## 2021-08-27 ENCOUNTER — Telehealth: Payer: Self-pay | Admitting: *Deleted

## 2021-08-27 DIAGNOSIS — Z5112 Encounter for antineoplastic immunotherapy: Secondary | ICD-10-CM | POA: Diagnosis not present

## 2021-08-27 DIAGNOSIS — C50412 Malignant neoplasm of upper-outer quadrant of left female breast: Secondary | ICD-10-CM

## 2021-08-27 MED ORDER — PEGFILGRASTIM-BMEZ 6 MG/0.6ML ~~LOC~~ SOSY
6.0000 mg | PREFILLED_SYRINGE | Freq: Once | SUBCUTANEOUS | Status: AC
  Administered 2021-08-27: 6 mg via SUBCUTANEOUS
  Filled 2021-08-27: qty 0.6

## 2021-08-27 NOTE — Telephone Encounter (Signed)
-----   Message from Willis Modena, RN sent at 08/23/2021  4:21 PM EDT ----- Regarding: Dr. Lindi Adie 1st time [facility] f/u tol well Dr. Lindi Adie 1st time [facility] f/u tolerated tx well--Pt call back due

## 2021-08-27 NOTE — Progress Notes (Signed)
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  Pt has 2 insurances so copay assistance isn't needed.  I informed her of the [employer], went over what it covers and gave her an expense sheet.  She would like to apply so she will bring her proof of income on 08/29/21.  She has my card for any questions or concerns she may have in the future.

## 2021-08-27 NOTE — Telephone Encounter (Signed)
Called & spoke to pt to see how she did with her recent chemotherapy.  She reports doing well.  She was just in for her Pegfilgrastim & took her claritin & reports knowing how to take claritin & tylenol.  She has had some nausea but took compazine & reports that it worked for her. She also has zofran & took once yesterday.  She reports knowing how to reach [place] if needed.

## 2021-08-29 ENCOUNTER — Inpatient Hospital Stay: Payer: Medicare Other | Attending: Hematology and Oncology

## 2021-08-29 ENCOUNTER — Inpatient Hospital Stay (HOSPITAL_BASED_OUTPATIENT_CLINIC_OR_DEPARTMENT_OTHER): Payer: Medicare Other | Admitting: Hematology and Oncology

## 2021-08-29 ENCOUNTER — Other Ambulatory Visit: Payer: Self-pay

## 2021-08-29 ENCOUNTER — Encounter: Payer: Self-pay | Admitting: Hematology and Oncology

## 2021-08-29 DIAGNOSIS — R5383 Other fatigue: Secondary | ICD-10-CM | POA: Diagnosis not present

## 2021-08-29 DIAGNOSIS — C50412 Malignant neoplasm of upper-outer quadrant of left female breast: Secondary | ICD-10-CM

## 2021-08-29 DIAGNOSIS — Z79899 Other long term (current) drug therapy: Secondary | ICD-10-CM | POA: Diagnosis not present

## 2021-08-29 DIAGNOSIS — Z95828 Presence of other vascular implants and grafts: Secondary | ICD-10-CM

## 2021-08-29 DIAGNOSIS — Z5112 Encounter for antineoplastic immunotherapy: Secondary | ICD-10-CM | POA: Diagnosis present

## 2021-08-29 DIAGNOSIS — R194 Change in bowel habit: Secondary | ICD-10-CM | POA: Diagnosis not present

## 2021-08-29 DIAGNOSIS — Z170 Estrogen receptor positive status [ER+]: Secondary | ICD-10-CM | POA: Diagnosis not present

## 2021-08-29 DIAGNOSIS — Z5189 Encounter for other specified aftercare: Secondary | ICD-10-CM | POA: Diagnosis not present

## 2021-08-29 DIAGNOSIS — Z5111 Encounter for antineoplastic chemotherapy: Secondary | ICD-10-CM | POA: Insufficient documentation

## 2021-08-29 DIAGNOSIS — R110 Nausea: Secondary | ICD-10-CM | POA: Diagnosis not present

## 2021-08-29 LAB — CMP (CANCER CENTER ONLY)
ALT: 22 U/L (ref 0–44)
AST: 17 U/L (ref 15–41)
Albumin: 4.1 g/dL (ref 3.5–5.0)
Alkaline Phosphatase: 65 U/L (ref 38–126)
Anion gap: 5 (ref 5–15)
BUN: 19 mg/dL (ref 8–23)
CO2: 30 mmol/L (ref 22–32)
Calcium: 9.4 mg/dL (ref 8.9–10.3)
Chloride: 101 mmol/L (ref 98–111)
Creatinine: 0.66 mg/dL (ref 0.44–1.00)
GFR, Estimated: >60 mL/min (ref >60)
Glucose, Bld: 121 mg/dL — ABNORMAL HIGH (ref 70–99)
Potassium: 4.4 mmol/L (ref 3.5–5.1)
Sodium: 136 mmol/L (ref 135–145)
Total Bilirubin: 0.9 mg/dL (ref 0.3–1.2)
Total Protein: 6.8 g/dL (ref 6.5–8.1)

## 2021-08-29 LAB — CBC WITH DIFFERENTIAL (CANCER CENTER ONLY)
Abs Immature Granulocytes: 0.65 10*3/uL — ABNORMAL HIGH (ref 0.00–0.07)
Basophils Absolute: 0.1 10*3/uL (ref 0.0–0.1)
Basophils Relative: 2 %
Eosinophils Absolute: 0 10*3/uL (ref 0.0–0.5)
Eosinophils Relative: 1 %
HCT: 39.4 % (ref 36.0–46.0)
Hemoglobin: 13.4 g/dL (ref 12.0–15.0)
Immature Granulocytes: 12 %
Lymphocytes Relative: 20 %
Lymphs Abs: 1.1 10*3/uL (ref 0.7–4.0)
MCH: 30 pg (ref 26.0–34.0)
MCHC: 34 g/dL (ref 30.0–36.0)
MCV: 88.1 fL (ref 80.0–100.0)
Monocytes Absolute: 0.1 10*3/uL (ref 0.1–1.0)
Monocytes Relative: 3 %
Neutro Abs: 3.4 10*3/uL (ref 1.7–7.7)
Neutrophils Relative %: 62 %
Platelet Count: 155 10*3/uL (ref 150–400)
RBC: 4.47 MIL/uL (ref 3.87–5.11)
RDW: 13.2 % (ref 11.5–15.5)
Smear Review: NORMAL
WBC Count: 5.4 10*3/uL (ref 4.0–10.5)
nRBC: 0 % (ref 0.0–0.2)

## 2021-08-29 MED ORDER — SODIUM CHLORIDE 0.9% FLUSH
10.0000 mL | Freq: Once | INTRAVENOUS | Status: AC
  Administered 2021-08-29: 10 mL

## 2021-08-29 MED ORDER — HEPARIN SOD (PORK) LOCK FLUSH 100 UNIT/ML IV SOLN
500.0000 [IU] | Freq: Once | INTRAVENOUS | Status: AC
  Administered 2021-08-29: 500 [IU]

## 2021-08-29 NOTE — Assessment & Plan Note (Signed)
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 Neoadjuvant chemotherapy withTCHP x6 followed by HP maintenance versus Kadcyla maintenance 2. Followed by breast conserving surgery with targeted axillary dissection 3. Followed by adjuvant radiation therapy 4.Followed by adjuvant antiestrogen therapy -------------------------------------------------------------------------------------------------------------------------------------- Current treatment: Cycle 1 day 8 TCHP Echocardiogram 08/03/2021: EF 65 to 70%  Chemotoxicities:  Return to clinic in 2 weeks for cycle 2

## 2021-08-29 NOTE — Progress Notes (Signed)
Pt is approved for the $1000 [employer] grant.  

## 2021-08-29 NOTE — Progress Notes (Signed)
Patient Care Team: Kathreen Devoid, PA-C as PCP - General (Internal Medicine) Mauro Kaufmann, RN as Oncology Nurse Navigator Rockwell Germany, RN as Oncology Nurse Navigator Nicholas Lose, MD as Consulting Physician (Hematology and Oncology)  DIAGNOSIS:  Encounter Diagnosis  Name Primary?   Malignant neoplasm of upper-outer quadrant of left breast in female, estrogen receptor positive ([place])     SUMMARY OF ONCOLOGIC HISTORY: Oncology History  Malignant neoplasm of upper-outer quadrant of left breast, estrogen receptor positive ([place])  07/01/2021 Initial Diagnosis   Screening mammogram 06/27/2021 detected abnormality in the left breast which led to diagnostic mammogram and left breast ultrasound that revealed hypoechoic mass with irregular borders 2:30 position 2 cm left axilla: 1 abnormal lymph node, grade 2 IDC with DCIS ER 99%, PR 95%, HER2 2+ by IHC, FISH positive, Ki-67 20%, left axilla lymph node: Positive   08/08/2021 Cancer Staging   Staging form: Breast, AJCC 8th Edition - Clinical stage from 08/08/2021: Stage IB (cT1c, cN1(f), cM0, G2, ER+, PR+, HER2+) - Signed by Nicholas Lose, MD on 08/08/2021 Stage prefix: Initial diagnosis Method of lymph node assessment: Core biopsy Histologic grading system: 3 grade system    08/22/2021 -  Chemotherapy   Patient is on Treatment Plan : BREAST  Docetaxel + Carboplatin + Trastuzumab + Pertuzumab  (TCHP) q21d         CHIEF COMPLIANT: Toxicity check  INTERVAL HISTORY: Amber Mccormick is a 66 y.o. female is here because of recent diagnosis of left breast cancer. She presents to the clinic today for a follow-up. States that she had nausea yesterday States that today she feels better. States she had no issues after treatment. Denies bone pain. States that she had some controllable diarrhea. States that she has some fatigue. States that she has some trouble sleeping. States that her taste went down and also smell of things.   ALLERGIES:   has No Known Allergies.  MEDICATIONS:  Current Outpatient Medications  Medication Sig Dispense Refill   amitriptyline (ELAVIL) 10 MG tablet Take 10 mg by mouth at bedtime.     atorvastatin (LIPITOR) 10 MG tablet Take 10 mg by mouth daily.     cholecalciferol (VITAMIN D3) 25 MCG (1000 UNIT) tablet Take 1,000 Units by mouth daily.     dexamethasone (DECADRON) 4 MG tablet Take 1 tablet (4 mg total) by mouth 2 (two) times daily. 1 tablet day before chemo 1 tablet day after chemo with food 30 tablet 1   diphenhydrAMINE (BENADRYL) 25 MG tablet Take 25 mg by mouth every 6 (six) hours as needed.     ibuprofen (ADVIL) 800 MG tablet Take 1 tablet (800 mg total) by mouth every 8 (eight) hours as needed. 30 tablet 0   levothyroxine (SYNTHROID) 50 MCG tablet Take 50 mcg by mouth daily.     lidocaine-prilocaine (EMLA) cream Apply to affected area once 30 g 3   metroNIDAZOLE (METROCREAM) 0.75 % cream Apply topically 2 (two) times daily as needed.     ondansetron (ZOFRAN) 8 MG tablet Take 1 tablet (8 mg total) by mouth 2 (two) times daily as needed (Nausea or vomiting). Start on the third day after chemotherapy. 30 tablet 1   oxyCODONE (OXY IR/ROXICODONE) 5 MG immediate release tablet Take 1 tablet (5 mg total) by mouth every 6 (six) hours as needed for severe pain. 15 tablet 0   prochlorperazine (COMPAZINE) 10 MG tablet Take 1 tablet (10 mg total) by mouth every 6 (six) hours as needed (Nausea  or vomiting). 30 tablet 1   vitamin B-12 (CYANOCOBALAMIN) 500 MCG tablet Take by mouth.     vitamin C (ASCORBIC ACID) 500 MG tablet Take 500 mg by mouth daily.     No current facility-administered medications for this visit.    PHYSICAL EXAMINATION: ECOG PERFORMANCE STATUS: 1 - Symptomatic but completely ambulatory  Vitals:   08/29/21 1000  BP: (!) 149/90  Pulse: 95  Resp: 17  Temp: (!) 97.2 F (36.2 C)  SpO2: 95%   Filed Weights   08/29/21 1000  Weight: 154 lb 1.6 oz (69.9 kg)      LABORATORY DATA:   I have reviewed the data as listed    Latest Ref Rng & Units 08/29/2021    9:42 AM 08/22/2021   11:24 AM  CMP  Glucose 70 - 99 mg/dL 121   120    BUN 8 - 23 mg/dL 19   12    Creatinine 0.44 - 1.00 mg/dL 0.66   0.75    Sodium 135 - 145 mmol/L 136   141    Potassium 3.5 - 5.1 mmol/L 4.4   4.0    Chloride 98 - 111 mmol/L 101   105    CO2 22 - 32 mmol/L 30   29    Calcium 8.9 - 10.3 mg/dL 9.4   9.2    Total Protein 6.5 - 8.1 g/dL 6.8   7.2    Total Bilirubin 0.3 - 1.2 mg/dL 0.9   0.4    Alkaline Phos 38 - 126 U/L 65   66    AST 15 - 41 U/L 17   17    ALT 0 - 44 U/L 22   12      Lab Results  Component Value Date   WBC 5.4 08/29/2021   HGB 13.4 08/29/2021   HCT 39.4 08/29/2021   MCV 88.1 08/29/2021   PLT 155 08/29/2021   NEUTROABS PENDING 08/29/2021    ASSESSMENT & PLAN:  Malignant neoplasm of upper-outer quadrant of left breast, estrogen receptor positive ([place]) 07/01/2021:Screening mammogram 06/27/2021 detected abnormality in the left breast which led to diagnostic mammogram and left breast ultrasound that revealed hypoechoic mass with irregular borders 2:30 position 2 cm left axilla: 1 abnormal lymph node, 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 Neoadjuvant chemotherapy with TCHP x6 followed by HP maintenance versus Kadcyla maintenance 2. Followed by breast conserving surgery with targeted axillary dissection 3. Followed by adjuvant radiation therapy 4.  Followed by adjuvant antiestrogen therapy -------------------------------------------------------------------------------------------------------------------------------------- Current treatment: Cycle 1 day 8 TCHP Echocardiogram 08/03/2021: EF 65 to 70%   Chemotoxicities: Mild nausea Mild loose stool Mild fatigue  No bone pain Labs have been reviewed and there is no evidence of neutropenia. Therefore we will keep the dosage the  same. Return to clinic in 2 weeks for cycle 2   No orders of the defined types were placed in this encounter.  The patient has a good understanding of the overall plan. she agrees with it. she will call with any problems that may develop before the next visit here. Total time spent: 30 mins including face to face time and time spent for planning, charting and co-ordination of care   Harriette Ohara, MD 08/29/21    I Gardiner Coins am scribing for Dr. Lindi Adie  I have reviewed the above documentation for accuracy and completeness,  and I agree with the above.

## 2021-08-29 NOTE — Progress Notes (Signed)
Patient Care Team: Kathreen Devoid, PA-C as PCP - General (Internal Medicine) Mauro Kaufmann, RN as Oncology Nurse Navigator Rockwell Germany, RN as Oncology Nurse Navigator Nicholas Lose, MD as Consulting Physician (Hematology and Oncology)  DIAGNOSIS:  Encounter Diagnosis  Name Primary?   Malignant neoplasm of upper-outer quadrant of left breast in female, estrogen receptor positive ([place])     SUMMARY OF ONCOLOGIC HISTORY: Oncology History  Malignant neoplasm of upper-outer quadrant of left breast, estrogen receptor positive ([place])  07/01/2021 Initial Diagnosis   Screening mammogram 06/27/2021 detected abnormality in the left breast which led to diagnostic mammogram and left breast ultrasound that revealed hypoechoic mass with irregular borders 2:30 position 2 cm left axilla: 1 abnormal lymph node, grade 2 IDC with DCIS ER 99%, PR 95%, HER2 2+ by IHC, FISH positive, Ki-67 20%, left axilla lymph node: Positive   08/08/2021 Cancer Staging   Staging form: Breast, AJCC 8th Edition - Clinical stage from 08/08/2021: Stage IB (cT1c, cN1(f), cM0, G2, ER+, PR+, HER2+) - Signed by Nicholas Lose, MD on 08/08/2021 Stage prefix: Initial diagnosis Method of lymph node assessment: Core biopsy Histologic grading system: 3 grade system   08/22/2021 -  Chemotherapy   Patient is on Treatment Plan : BREAST  Docetaxel + Carboplatin + Trastuzumab + Pertuzumab  (TCHP) q21d        CHIEF COMPLIANT: Toxicity check    INTERVAL HISTORY: Britny Riel is a female is here because of recent diagnosis of left breast cancer. She presents to the clinic today for a follow-up. States after chemo she had some fatigue. States that she did have some diarrhea. States she had diarrhea 3 times a day.  ALLERGIES:  has No Known Allergies.  MEDICATIONS:  Current Outpatient Medications  Medication Sig Dispense Refill   diphenoxylate-atropine (LOMOTIL) 2.5-0.025 MG tablet Take 1 tablet by mouth 4 (four) times daily  as needed for diarrhea or loose stools. 30 tablet 3   amitriptyline (ELAVIL) 10 MG tablet Take 10 mg by mouth at bedtime.     atorvastatin (LIPITOR) 10 MG tablet Take 10 mg by mouth daily.     cholecalciferol (VITAMIN D3) 25 MCG (1000 UNIT) tablet Take 1,000 Units by mouth daily.     dexamethasone (DECADRON) 4 MG tablet Take 1 tablet (4 mg total) by mouth 2 (two) times daily. 1 tablet day before chemo 1 tablet day after chemo with food 30 tablet 1   diphenhydrAMINE (BENADRYL) 25 MG tablet Take 25 mg by mouth every 6 (six) hours as needed.     ibuprofen (ADVIL) 800 MG tablet Take 1 tablet (800 mg total) by mouth every 8 (eight) hours as needed. 30 tablet 0   levothyroxine (SYNTHROID) 50 MCG tablet Take 50 mcg by mouth daily.     lidocaine-prilocaine (EMLA) cream Apply to affected area once 30 g 3   metroNIDAZOLE (METROCREAM) 0.75 % cream Apply topically 2 (two) times daily as needed.     ondansetron (ZOFRAN) 8 MG tablet Take 1 tablet (8 mg total) by mouth 2 (two) times daily as needed (Nausea or vomiting). Start on the third day after chemotherapy. 30 tablet 1   oxyCODONE (OXY IR/ROXICODONE) 5 MG immediate release tablet Take 1 tablet (5 mg total) by mouth every 6 (six) hours as needed for severe pain. 15 tablet 0   prochlorperazine (COMPAZINE) 10 MG tablet Take 1 tablet (10 mg total) by mouth every 6 (six) hours as needed (Nausea or vomiting). 30 tablet 1   vitamin  B-12 (CYANOCOBALAMIN) 500 MCG tablet Take by mouth.     vitamin C (ASCORBIC ACID) 500 MG tablet Take 500 mg by mouth daily.     No current facility-administered medications for this visit.    PHYSICAL EXAMINATION: ECOG PERFORMANCE STATUS: 1 - Symptomatic but completely ambulatory  Vitals:   09/12/21 1049  BP: (!) 143/81  Pulse: 82  Resp: 18  Temp: 97.7 F (36.5 C)  SpO2: 99%   Filed Weights   09/12/21 1049  Weight: 156 lb 8 oz (71 kg)     LABORATORY DATA:  I have reviewed the data as listed    Latest Ref Rng & Units  09/12/2021   10:15 AM 08/29/2021    9:42 AM 08/22/2021   11:24 AM  CMP  Glucose 70 - 99 mg/dL 100  121  120   BUN 8 - 23 mg/dL '17  19  12   ' Creatinine 0.44 - 1.00 mg/dL 0.62  0.66  0.75   Sodium 135 - 145 mmol/L 140  136  141   Potassium 3.5 - 5.1 mmol/L 3.6  4.4  4.0   Chloride 98 - 111 mmol/L 104  101  105   CO2 22 - 32 mmol/L '31  30  29   ' Calcium 8.9 - 10.3 mg/dL 9.2  9.4  9.2   Total Protein 6.5 - 8.1 g/dL 6.3  6.8  7.2   Total Bilirubin 0.3 - 1.2 mg/dL 0.4  0.9  0.4   Alkaline Phos 38 - 126 U/L 71  65  66   AST 15 - 41 U/L '15  17  17   ' ALT 0 - 44 U/L '17  22  12     ' Lab Results  Component Value Date   WBC 8.1 09/12/2021   HGB 11.6 (L) 09/12/2021   HCT 34.4 (L) 09/12/2021   MCV 89.6 09/12/2021   PLT 302 09/12/2021   NEUTROABS 4.8 09/12/2021    ASSESSMENT & PLAN:  Malignant neoplasm of upper-outer quadrant of left breast, estrogen receptor positive ([place]) 07/01/2021:Screening mammogram 06/27/2021 detected abnormality in the left breast which led to diagnostic mammogram and left breast ultrasound that revealed hypoechoic mass with irregular borders 2:30 position 2 cm left axilla: 1 abnormal lymph node, 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 Neoadjuvant chemotherapy with TCHP x6 followed by HP maintenance versus Kadcyla maintenance 2. Followed by breast conserving surgery with targeted axillary dissection 3. Followed by adjuvant radiation therapy 4.  Followed by adjuvant antiestrogen therapy -------------------------------------------------------------------------------------------------------------------------------------- Current treatment: Cycle 2 TCHP Echocardiogram 08/03/2021: EF 65 to 70%   Chemotoxicities: Mild nausea Mild loose stool: sent prescription for Lomotil Mild fatigue Chemo induced anemia: monitoring closely   No bone pain  Return to clinic in 3 weeks for cycle  3    No orders of the defined types were placed in this encounter.  The patient has a good understanding of the overall plan. she agrees with it. she will call with any problems that may develop before the next visit here. Total time spent: 30 mins including face to face time and time spent for planning, charting and co-ordination of care   Harriette Ohara, MD 09/12/21    I Gardiner Coins am scribing for Dr. Lindi Adie  I have reviewed the above documentation for accuracy and completeness, and I agree with the above.

## 2021-08-30 ENCOUNTER — Encounter: Payer: Self-pay | Admitting: *Deleted

## 2021-09-12 ENCOUNTER — Inpatient Hospital Stay: Payer: Medicare Other

## 2021-09-12 ENCOUNTER — Other Ambulatory Visit: Payer: Self-pay

## 2021-09-12 ENCOUNTER — Inpatient Hospital Stay (HOSPITAL_BASED_OUTPATIENT_CLINIC_OR_DEPARTMENT_OTHER): Payer: Medicare Other | Admitting: Hematology and Oncology

## 2021-09-12 VITALS — BP 131/80 | HR 71 | Temp 98.0°F | Resp 18

## 2021-09-12 DIAGNOSIS — C50412 Malignant neoplasm of upper-outer quadrant of left female breast: Secondary | ICD-10-CM

## 2021-09-12 DIAGNOSIS — Z95828 Presence of other vascular implants and grafts: Secondary | ICD-10-CM

## 2021-09-12 DIAGNOSIS — Z170 Estrogen receptor positive status [ER+]: Secondary | ICD-10-CM

## 2021-09-12 DIAGNOSIS — Z5112 Encounter for antineoplastic immunotherapy: Secondary | ICD-10-CM | POA: Diagnosis not present

## 2021-09-12 LAB — CBC WITH DIFFERENTIAL (CANCER CENTER ONLY)
Abs Immature Granulocytes: 0.02 10*3/uL (ref 0.00–0.07)
Basophils Absolute: 0.1 10*3/uL (ref 0.0–0.1)
Basophils Relative: 1 %
Eosinophils Absolute: 0 10*3/uL (ref 0.0–0.5)
Eosinophils Relative: 0 %
HCT: 34.4 % — ABNORMAL LOW (ref 36.0–46.0)
Hemoglobin: 11.6 g/dL — ABNORMAL LOW (ref 12.0–15.0)
Immature Granulocytes: 0 %
Lymphocytes Relative: 32 %
Lymphs Abs: 2.6 10*3/uL (ref 0.7–4.0)
MCH: 30.2 pg (ref 26.0–34.0)
MCHC: 33.7 g/dL (ref 30.0–36.0)
MCV: 89.6 fL (ref 80.0–100.0)
Monocytes Absolute: 0.6 10*3/uL (ref 0.1–1.0)
Monocytes Relative: 8 %
Neutro Abs: 4.8 10*3/uL (ref 1.7–7.7)
Neutrophils Relative %: 59 %
Platelet Count: 302 10*3/uL (ref 150–400)
RBC: 3.84 MIL/uL — ABNORMAL LOW (ref 3.87–5.11)
RDW: 14.7 % (ref 11.5–15.5)
WBC Count: 8.1 10*3/uL (ref 4.0–10.5)
nRBC: 0 % (ref 0.0–0.2)

## 2021-09-12 LAB — CMP (CANCER CENTER ONLY)
ALT: 17 U/L (ref 0–44)
AST: 15 U/L (ref 15–41)
Albumin: 3.7 g/dL (ref 3.5–5.0)
Alkaline Phosphatase: 71 U/L (ref 38–126)
Anion gap: 5 (ref 5–15)
BUN: 17 mg/dL (ref 8–23)
CO2: 31 mmol/L (ref 22–32)
Calcium: 9.2 mg/dL (ref 8.9–10.3)
Chloride: 104 mmol/L (ref 98–111)
Creatinine: 0.62 mg/dL (ref 0.44–1.00)
GFR, Estimated: >60 mL/min (ref >60)
Glucose, Bld: 100 mg/dL — ABNORMAL HIGH (ref 70–99)
Potassium: 3.6 mmol/L (ref 3.5–5.1)
Sodium: 140 mmol/L (ref 135–145)
Total Bilirubin: 0.4 mg/dL (ref 0.3–1.2)
Total Protein: 6.3 g/dL — ABNORMAL LOW (ref 6.5–8.1)

## 2021-09-12 MED ORDER — SODIUM CHLORIDE 0.9 % IV SOLN
347.2000 mg | Freq: Once | INTRAVENOUS | Status: AC
  Administered 2021-09-12: 350 mg via INTRAVENOUS
  Filled 2021-09-12: qty 35

## 2021-09-12 MED ORDER — HEPARIN SOD (PORK) LOCK FLUSH 100 UNIT/ML IV SOLN
500.0000 [IU] | Freq: Once | INTRAVENOUS | Status: AC | PRN
  Administered 2021-09-12: 500 [IU]

## 2021-09-12 MED ORDER — PALONOSETRON HCL INJECTION 0.25 MG/5ML
0.2500 mg | Freq: Once | INTRAVENOUS | Status: AC
  Administered 2021-09-12: 0.25 mg via INTRAVENOUS
  Filled 2021-09-12: qty 5

## 2021-09-12 MED ORDER — SODIUM CHLORIDE 0.9 % IV SOLN
10.0000 mg | Freq: Once | INTRAVENOUS | Status: AC
  Administered 2021-09-12: 10 mg via INTRAVENOUS
  Filled 2021-09-12: qty 10

## 2021-09-12 MED ORDER — SODIUM CHLORIDE 0.9% FLUSH
10.0000 mL | Freq: Once | INTRAVENOUS | Status: AC
  Administered 2021-09-12: 10 mL

## 2021-09-12 MED ORDER — SODIUM CHLORIDE 0.9 % IV SOLN
50.0000 mg/m2 | Freq: Once | INTRAVENOUS | Status: AC
  Administered 2021-09-12: 90 mg via INTRAVENOUS
  Filled 2021-09-12: qty 9

## 2021-09-12 MED ORDER — SODIUM CHLORIDE 0.9% FLUSH
10.0000 mL | INTRAVENOUS | Status: DC | PRN
  Administered 2021-09-12: 10 mL

## 2021-09-12 MED ORDER — DIPHENOXYLATE-ATROPINE 2.5-0.025 MG PO TABS
1.0000 | ORAL_TABLET | Freq: Four times a day (QID) | ORAL | 3 refills | Status: AC | PRN
Start: 2021-09-12 — End: ?

## 2021-09-12 MED ORDER — ACETAMINOPHEN 325 MG PO TABS
650.0000 mg | ORAL_TABLET | Freq: Once | ORAL | Status: AC
  Administered 2021-09-12: 650 mg via ORAL
  Filled 2021-09-12: qty 2

## 2021-09-12 MED ORDER — TRASTUZUMAB-ANNS CHEMO 150 MG IV SOLR
6.0000 mg/kg | Freq: Once | INTRAVENOUS | Status: AC
  Administered 2021-09-12: 420 mg via INTRAVENOUS
  Filled 2021-09-12: qty 20

## 2021-09-12 MED ORDER — SODIUM CHLORIDE 0.9 % IV SOLN
420.0000 mg | Freq: Once | INTRAVENOUS | Status: AC
  Administered 2021-09-12: 420 mg via INTRAVENOUS
  Filled 2021-09-12: qty 14

## 2021-09-12 MED ORDER — DIPHENHYDRAMINE HCL 25 MG PO CAPS
50.0000 mg | ORAL_CAPSULE | Freq: Once | ORAL | Status: AC
  Administered 2021-09-12: 50 mg via ORAL
  Filled 2021-09-12: qty 2

## 2021-09-12 MED ORDER — SODIUM CHLORIDE 0.9 % IV SOLN
150.0000 mg | Freq: Once | INTRAVENOUS | Status: AC
  Administered 2021-09-12: 150 mg via INTRAVENOUS
  Filled 2021-09-12: qty 150

## 2021-09-12 MED ORDER — SODIUM CHLORIDE 0.9 % IV SOLN: Freq: Once | INTRAVENOUS | Status: AC

## 2021-09-12 NOTE — Patient Instructions (Signed)
[facility]COL[facility]  Discharge Instructions: Thank you for choosing [facility] to provide your oncology and hematology care.   If you have a lab appointment with the [facility], please go directly to the [facility] and check in at the registration area.   Wear comfortable clothing and clothing appropriate for easy access to any Portacath or PICC line.   We strive to give you quality time with your provider. You may need to reschedule your appointment if you arrive late (15 or more minutes).  Arriving late affects you and other patients whose appointments are after yours.  Also, if you miss three or more appointments without notifying the office, you may be dismissed from the clinic at the provider's discretion.      For prescription refill requests, have your pharmacy contact our office and allow 72 hours for refills to be completed.    Today you received the following chemotherapy and/or immunotherapy agents; Docetaxel, Carboplatin, Trastuzumab, & Pertuzumab      To help prevent nausea and vomiting after your treatment, we encourage you to take your nausea medication as directed.  BELOW ARE SYMPTOMS THAT SHOULD BE REPORTED IMMEDIATELY: *FEVER GREATER THAN 100.4 F (38 C) OR HIGHER *CHILLS OR SWEATING *NAUSEA AND VOMITING THAT IS NOT CONTROLLED WITH YOUR NAUSEA MEDICATION *UNUSUAL SHORTNESS OF BREATH *UNUSUAL BRUISING OR BLEEDING *URINARY PROBLEMS (pain or burning when urinating, or frequent urination) *BOWEL PROBLEMS (unusual diarrhea, constipation, pain near the anus) TENDERNESS IN MOUTH AND THROAT WITH OR WITHOUT PRESENCE OF ULCERS (sore throat, sores in mouth, or a toothache) UNUSUAL RASH, SWELLING OR PAIN  UNUSUAL VAGINAL DISCHARGE OR ITCHING   Items with * indicate a potential emergency and should be followed up as soon as possible or go to the Emergency Department if any problems should occur.  Please show the CHEMOTHERAPY ALERT CARD or  IMMUNOTHERAPY ALERT CARD at check-in to the Emergency Department and triage nurse.  Should you have questions after your visit or need to cancel or reschedule your appointment, please contact [facility]  Dept: [phone]  and follow the prompts.  Office hours are 8:00 a.m. to 4:30 p.m. Monday - Friday. Please note that voicemails left after 4:00 p.m. may not be returned until the following business day.  We are closed weekends and major holidays. You have access to a nurse at all times for urgent questions. Please call the main number to the clinic Dept: [phone] and follow the prompts.   For any non-urgent questions, you may also contact your provider using MyChart. We now offer e-Visits for anyone 48 and older to request care online for non-urgent symptoms. For details visit [email or web address].   Also download the MyChart app! Go to the app store, search "MyChart", open the app, select [facility], and log in with your MyChart username and password.  Masks are optional in the cancer centers. If you would like for your care team to wear a mask while they are taking care of you, please let them know. For doctor visits, patients may have with them one support Icie Kuznicki who is at least 68 years old. At this time, visitors are not allowed in the infusion area.

## 2021-09-12 NOTE — Assessment & Plan Note (Signed)
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 Neoadjuvant chemotherapy withTCHP x6 followed by HP maintenance versus Kadcyla maintenance 2. Followed by breast conserving surgery with targeted axillary dissection 3. Followed by adjuvant radiation therapy 4.Followed by adjuvant antiestrogen therapy -------------------------------------------------------------------------------------------------------------------------------------- Current treatment:Cycle 2 TCHP Echocardiogram 08/03/2021: EF 65 to 70%  Chemotoxicities: 1. Mild nausea 2. Mild loose stool 3. Mild fatigue  No bone pain Labs have been reviewed and there is no evidence of neutropenia. Therefore we will keep the dosage the same. Return to clinic in 3 weeks for cycle 3

## 2021-09-14 ENCOUNTER — Other Ambulatory Visit: Payer: Self-pay

## 2021-09-14 ENCOUNTER — Inpatient Hospital Stay: Payer: Medicare Other

## 2021-09-14 VITALS — BP 119/69 | HR 76 | Temp 97.7°F

## 2021-09-14 DIAGNOSIS — Z170 Estrogen receptor positive status [ER+]: Secondary | ICD-10-CM

## 2021-09-14 DIAGNOSIS — Z5112 Encounter for antineoplastic immunotherapy: Secondary | ICD-10-CM | POA: Diagnosis not present

## 2021-09-14 MED ORDER — PEGFILGRASTIM-BMEZ 6 MG/0.6ML ~~LOC~~ SOSY
6.0000 mg | PREFILLED_SYRINGE | Freq: Once | SUBCUTANEOUS | Status: AC
  Administered 2021-09-14: 6 mg via SUBCUTANEOUS
  Filled 2021-09-14: qty 0.6

## 2021-09-14 NOTE — Patient Instructions (Signed)

## 2021-10-03 ENCOUNTER — Inpatient Hospital Stay: Payer: Medicare Other

## 2021-10-03 ENCOUNTER — Other Ambulatory Visit: Payer: Self-pay

## 2021-10-03 ENCOUNTER — Inpatient Hospital Stay (HOSPITAL_BASED_OUTPATIENT_CLINIC_OR_DEPARTMENT_OTHER): Payer: Medicare Other | Admitting: Hematology and Oncology

## 2021-10-03 ENCOUNTER — Inpatient Hospital Stay: Payer: Medicare Other | Attending: Hematology and Oncology

## 2021-10-03 DIAGNOSIS — Z5111 Encounter for antineoplastic chemotherapy: Secondary | ICD-10-CM | POA: Diagnosis present

## 2021-10-03 DIAGNOSIS — C50412 Malignant neoplasm of upper-outer quadrant of left female breast: Secondary | ICD-10-CM

## 2021-10-03 DIAGNOSIS — Z170 Estrogen receptor positive status [ER+]: Secondary | ICD-10-CM | POA: Diagnosis not present

## 2021-10-03 DIAGNOSIS — Z79899 Other long term (current) drug therapy: Secondary | ICD-10-CM | POA: Insufficient documentation

## 2021-10-03 DIAGNOSIS — Z5112 Encounter for antineoplastic immunotherapy: Secondary | ICD-10-CM | POA: Diagnosis not present

## 2021-10-03 DIAGNOSIS — Z95828 Presence of other vascular implants and grafts: Secondary | ICD-10-CM

## 2021-10-03 DIAGNOSIS — Z5189 Encounter for other specified aftercare: Secondary | ICD-10-CM | POA: Diagnosis not present

## 2021-10-03 LAB — CMP (CANCER CENTER ONLY)
ALT: 15 U/L (ref 0–44)
AST: 17 U/L (ref 15–41)
Albumin: 4.1 g/dL (ref 3.5–5.0)
Alkaline Phosphatase: 81 U/L (ref 38–126)
Anion gap: 6 (ref 5–15)
BUN: 15 mg/dL (ref 8–23)
CO2: 28 mmol/L (ref 22–32)
Calcium: 9.1 mg/dL (ref 8.9–10.3)
Chloride: 106 mmol/L (ref 98–111)
Creatinine: 0.59 mg/dL (ref 0.44–1.00)
GFR, Estimated: >60 mL/min (ref >60)
Glucose, Bld: 76 mg/dL (ref 70–99)
Potassium: 3.6 mmol/L (ref 3.5–5.1)
Sodium: 140 mmol/L (ref 135–145)
Total Bilirubin: 0.4 mg/dL (ref 0.3–1.2)
Total Protein: 6.6 g/dL (ref 6.5–8.1)

## 2021-10-03 LAB — CBC WITH DIFFERENTIAL (CANCER CENTER ONLY)
Abs Immature Granulocytes: 0.02 10*3/uL (ref 0.00–0.07)
Basophils Absolute: 0.1 10*3/uL (ref 0.0–0.1)
Basophils Relative: 1 %
Eosinophils Absolute: 0 10*3/uL (ref 0.0–0.5)
Eosinophils Relative: 0 %
HCT: 34.6 % — ABNORMAL LOW (ref 36.0–46.0)
Hemoglobin: 11.9 g/dL — ABNORMAL LOW (ref 12.0–15.0)
Immature Granulocytes: 0 %
Lymphocytes Relative: 32 %
Lymphs Abs: 3.2 10*3/uL (ref 0.7–4.0)
MCH: 31 pg (ref 26.0–34.0)
MCHC: 34.4 g/dL (ref 30.0–36.0)
MCV: 90.1 fL (ref 80.0–100.0)
Monocytes Absolute: 0.9 10*3/uL (ref 0.1–1.0)
Monocytes Relative: 8 %
Neutro Abs: 6 10*3/uL (ref 1.7–7.7)
Neutrophils Relative %: 59 %
Platelet Count: 224 10*3/uL (ref 150–400)
RBC: 3.84 MIL/uL — ABNORMAL LOW (ref 3.87–5.11)
RDW: 15.9 % — ABNORMAL HIGH (ref 11.5–15.5)
WBC Count: 10.1 10*3/uL (ref 4.0–10.5)
nRBC: 0 % (ref 0.0–0.2)

## 2021-10-03 MED ORDER — HEPARIN SOD (PORK) LOCK FLUSH 100 UNIT/ML IV SOLN
500.0000 [IU] | Freq: Once | INTRAVENOUS | Status: AC | PRN
  Administered 2021-10-03: 500 [IU]

## 2021-10-03 MED ORDER — SODIUM CHLORIDE 0.9 % IV SOLN
150.0000 mg | Freq: Once | INTRAVENOUS | Status: AC
  Administered 2021-10-03: 150 mg via INTRAVENOUS
  Filled 2021-10-03: qty 150

## 2021-10-03 MED ORDER — SODIUM CHLORIDE 0.9 % IV SOLN
10.0000 mg | Freq: Once | INTRAVENOUS | Status: AC
  Administered 2021-10-03: 10 mg via INTRAVENOUS
  Filled 2021-10-03: qty 10

## 2021-10-03 MED ORDER — SODIUM CHLORIDE 0.9 % IV SOLN: Freq: Once | INTRAVENOUS | Status: AC

## 2021-10-03 MED ORDER — SODIUM CHLORIDE 0.9 % IV SOLN
420.0000 mg | Freq: Once | INTRAVENOUS | Status: AC
  Administered 2021-10-03: 420 mg via INTRAVENOUS
  Filled 2021-10-03: qty 14

## 2021-10-03 MED ORDER — SODIUM CHLORIDE 0.9 % IV SOLN
50.0000 mg/m2 | Freq: Once | INTRAVENOUS | Status: AC
  Administered 2021-10-03: 90 mg via INTRAVENOUS
  Filled 2021-10-03: qty 9

## 2021-10-03 MED ORDER — SODIUM CHLORIDE 0.9 % IV SOLN
347.2000 mg | Freq: Once | INTRAVENOUS | Status: AC
  Administered 2021-10-03: 350 mg via INTRAVENOUS
  Filled 2021-10-03: qty 35

## 2021-10-03 MED ORDER — TRASTUZUMAB-ANNS CHEMO 150 MG IV SOLR
6.0000 mg/kg | Freq: Once | INTRAVENOUS | Status: AC
  Administered 2021-10-03: 420 mg via INTRAVENOUS
  Filled 2021-10-03: qty 20

## 2021-10-03 MED ORDER — SODIUM CHLORIDE 0.9% FLUSH
10.0000 mL | INTRAVENOUS | Status: DC | PRN
  Administered 2021-10-03: 10 mL

## 2021-10-03 MED ORDER — DIPHENHYDRAMINE HCL 25 MG PO CAPS
50.0000 mg | ORAL_CAPSULE | Freq: Once | ORAL | Status: AC
  Administered 2021-10-03: 50 mg via ORAL
  Filled 2021-10-03: qty 2

## 2021-10-03 MED ORDER — SODIUM CHLORIDE 0.9% FLUSH
10.0000 mL | Freq: Once | INTRAVENOUS | Status: AC
  Administered 2021-10-03: 10 mL

## 2021-10-03 MED ORDER — PALONOSETRON HCL INJECTION 0.25 MG/5ML
0.2500 mg | Freq: Once | INTRAVENOUS | Status: AC
  Administered 2021-10-03: 0.25 mg via INTRAVENOUS
  Filled 2021-10-03: qty 5

## 2021-10-03 MED ORDER — ACETAMINOPHEN 325 MG PO TABS
650.0000 mg | ORAL_TABLET | Freq: Once | ORAL | Status: AC
  Administered 2021-10-03: 650 mg via ORAL
  Filled 2021-10-03: qty 2

## 2021-10-03 NOTE — Patient Instructions (Signed)
[facility] ONCOLOG[facility]  Discharge Instructions: Thank you for choosing [facility] to provide your oncology and hematology care.   If you have a lab appointment with the [facility], please go directly to the [facility] and check in at the registration area.   Wear comfortable clothing and clothing appropriate for easy access to any Portacath or PICC line.   We strive to give you quality time with your provider. You may need to reschedule your appointment if you arrive late (15 or more minutes).  Arriving late affects you and other patients whose appointments are after yours.  Also, if you miss three or more appointments without notifying the office, you may be dismissed from the clinic at the provider's discretion.      For prescription refill requests, have your pharmacy contact our office and allow 72 hours for refills to be completed.    Today you received the following chemotherapy and/or immunotherapy agents :  Trastuzumab,  Pertuzumab,  Docetaxel,  Carboplatin.   To help prevent nausea and vomiting after your treatment, we encourage you to take your nausea medication as directed.  BELOW ARE SYMPTOMS THAT SHOULD BE REPORTED IMMEDIATELY: *FEVER GREATER THAN 100.4 F (38 C) OR HIGHER *CHILLS OR SWEATING *NAUSEA AND VOMITING THAT IS NOT CONTROLLED WITH YOUR NAUSEA MEDICATION *UNUSUAL SHORTNESS OF BREATH *UNUSUAL BRUISING OR BLEEDING *URINARY PROBLEMS (pain or burning when urinating, or frequent urination) *BOWEL PROBLEMS (unusual diarrhea, constipation, pain near the anus) TENDERNESS IN MOUTH AND THROAT WITH OR WITHOUT PRESENCE OF ULCERS (sore throat, sores in mouth, or a toothache) UNUSUAL RASH, SWELLING OR PAIN  UNUSUAL VAGINAL DISCHARGE OR ITCHING   Items with * indicate a potential emergency and should be followed up as soon as possible or go to the Emergency Department if any problems should occur.  Please show the CHEMOTHERAPY ALERT CARD or  IMMUNOTHERAPY ALERT CARD at check-in to the Emergency Department and triage nurse.  Should you have questions after your visit or need to cancel or reschedule your appointment, please contact [facility]  Dept: [phone]  and follow the prompts.  Office hours are 8:00 a.m. to 4:30 p.m. Monday - Friday. Please note that voicemails left after 4:00 p.m. may not be returned until the following business day.  We are closed weekends and major holidays. You have access to a nurse at all times for urgent questions. Please call the main number to the clinic Dept: [phone] and follow the prompts.   For any non-urgent questions, you may also contact your provider using MyChart. We now offer e-Visits for anyone 46 and older to request care online for non-urgent symptoms. For details visit [email or web address].   Also download the MyChart app! Go to the app store, search "MyChart", open the app, select [facility], and log in with your MyChart username and password.  Masks are optional in the cancer centers. If you would like for your care team to wear a mask while they are taking care of you, please let them know. For doctor visits, patients may have with them one support person who is at least 68 years old. At this time, visitors are not allowed in the infusion area.

## 2021-10-03 NOTE — Assessment & Plan Note (Signed)
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 Neoadjuvant chemotherapy withTCHP x6 followed by HP maintenance versus Kadcyla maintenance 2. Followed by breast conserving surgery with targeted axillary dissection 3. Followed by adjuvant radiation therapy 4.Followed by adjuvant antiestrogen therapy -------------------------------------------------------------------------------------------------------------------------------------- Current treatment:Cycle 3TCHP Echocardiogram 08/03/2021: EF 65 to 70%  Chemotoxicities: 1. Mild nausea 2. Mild loose stool: sent prescription for Lomotil 3. Mild fatigue 4. Chemo induced anemia: monitoring closely  No bone pain  Return to clinic in3 weeks for cycle 4

## 2021-10-03 NOTE — Progress Notes (Signed)
Patient Care Team: Kathreen Devoid, PA-C as PCP - General (Internal Medicine) Mauro Kaufmann, RN as Oncology Nurse Navigator Rockwell Germany, RN as Oncology Nurse Navigator Nicholas Lose, MD as Consulting Physician (Hematology and Oncology)  DIAGNOSIS:  Encounter Diagnosis  Name Primary?   Malignant neoplasm of upper-outer quadrant of left breast in female, estrogen receptor positive ([place])     SUMMARY OF ONCOLOGIC HISTORY: Oncology History  Malignant neoplasm of upper-outer quadrant of left breast, estrogen receptor positive ([place])  07/01/2021 Initial Diagnosis   Screening mammogram 06/27/2021 detected abnormality in the left breast which led to diagnostic mammogram and left breast ultrasound that revealed hypoechoic mass with irregular borders 2:30 position 2 cm left axilla: 1 abnormal lymph node, grade 2 IDC with DCIS ER 99%, PR 95%, HER2 2+ by IHC, FISH positive, Ki-67 20%, left axilla lymph node: Positive   08/08/2021 Cancer Staging   Staging form: Breast, AJCC 8th Edition - Clinical stage from 08/08/2021: Stage IB (cT1c, cN1(f), cM0, G2, ER+, PR+, HER2+) - Signed by Nicholas Lose, MD on 08/08/2021 Stage prefix: Initial diagnosis Method of lymph node assessment: Core biopsy Histologic grading system: 3 grade system   08/22/2021 -  Chemotherapy   Patient is on Treatment Plan : BREAST  Docetaxel + Carboplatin + Trastuzumab + Pertuzumab  (TCHP) q21d        CHIEF COMPLIANT: Cycle 3 TCHP  INTERVAL HISTORY: Amber Mccormick is a 66 y.o female is here because of recent diagnosis of left breast cancer. She presents to the clinic today for a follow-up.  She is tolerating the chemotherapy extremely well.  She does have nausea and intermittent diarrhea for the first few days after chemo and fatigue and decreased appetite.  All of the symptoms resolved and over the past 10 days she has been feeling quite well.  Denies any peripheral neuropathy.   ALLERGIES:  has No Known  Allergies.  MEDICATIONS:  Current Outpatient Medications  Medication Sig Dispense Refill   amitriptyline (ELAVIL) 10 MG tablet Take 10 mg by mouth at bedtime.     atorvastatin (LIPITOR) 10 MG tablet Take 10 mg by mouth daily.     cholecalciferol (VITAMIN D3) 25 MCG (1000 UNIT) tablet Take 1,000 Units by mouth daily.     dexamethasone (DECADRON) 4 MG tablet Take 1 tablet (4 mg total) by mouth 2 (two) times daily. 1 tablet day before chemo 1 tablet day after chemo with food 30 tablet 1   diphenhydrAMINE (BENADRYL) 25 MG tablet Take 25 mg by mouth every 6 (six) hours as needed.     diphenoxylate-atropine (LOMOTIL) 2.5-0.025 MG tablet Take 1 tablet by mouth 4 (four) times daily as needed for diarrhea or loose stools. 30 tablet 3   ibuprofen (ADVIL) 800 MG tablet Take 1 tablet (800 mg total) by mouth every 8 (eight) hours as needed. 30 tablet 0   levothyroxine (SYNTHROID) 50 MCG tablet Take 50 mcg by mouth daily.     lidocaine-prilocaine (EMLA) cream Apply to affected area once 30 g 3   metroNIDAZOLE (METROCREAM) 0.75 % cream Apply topically 2 (two) times daily as needed.     ondansetron (ZOFRAN) 8 MG tablet Take 1 tablet (8 mg total) by mouth 2 (two) times daily as needed (Nausea or vomiting). Start on the third day after chemotherapy. 30 tablet 1   oxyCODONE (OXY IR/ROXICODONE) 5 MG immediate release tablet Take 1 tablet (5 mg total) by mouth every 6 (six) hours as needed for severe pain. 15 tablet  0   prochlorperazine (COMPAZINE) 10 MG tablet Take 1 tablet (10 mg total) by mouth every 6 (six) hours as needed (Nausea or vomiting). 30 tablet 1   vitamin B-12 (CYANOCOBALAMIN) 500 MCG tablet Take by mouth.     vitamin C (ASCORBIC ACID) 500 MG tablet Take 500 mg by mouth daily.     No current facility-administered medications for this visit.    PHYSICAL EXAMINATION: ECOG PERFORMANCE STATUS: 1 - Symptomatic but completely ambulatory  Vitals:   10/03/21 1022  BP: (!) 144/89  Pulse: 83  Resp: 18   Temp: (!) 97.3 F (36.3 C)  SpO2: 99%   Filed Weights   10/03/21 1022  Weight: 155 lb (70.3 kg)      LABORATORY DATA:  I have reviewed the data as listed    Latest Ref Rng & Units 10/03/2021   10:17 AM 09/12/2021   10:15 AM 08/29/2021    9:42 AM  CMP  Glucose 70 - 99 mg/dL 76  100  121   BUN 8 - 23 mg/dL _0 Creatinine 0.44 - 1.00 mg/dL 0.59  0.62  0.66   Sodium 135 - 145 mmol/L 140  140  136   Potassium 3.5 - 5.1 mmol/L 3.6  3.6  4.4   Chloride 98 - 111 mmol/L 106  104  101   CO2 22 - 32 mmol/L _1 Calcium 8.9 - 10.3 mg/dL 9.1  9.2  9.4   Total Protein 6.5 - 8.1 g/dL 6.6  6.3  6.8   Total Bilirubin 0.3 - 1.2 mg/dL 0.4  0.4  0.9   Alkaline Phos 38 - 126 U/L 81  71  65   AST 15 - 41 U/L _2 ALT 0 - 44 U/L _3 Lab Results  Component Value Date   WBC 10.1 10/03/2021   HGB 11.9 (L) 10/03/2021   HCT 34.6 (L) 10/03/2021   MCV 90.1 10/03/2021   PLT 224 10/03/2021   NEUTROABS 6.0 10/03/2021    ASSESSMENT & PLAN:  Malignant neoplasm of upper-outer quadrant of left breast, estrogen receptor positive ([place]) 07/01/2021:Screening mammogram 06/27/2021 detected abnormality in the left breast which led to diagnostic mammogram and left breast ultrasound that revealed hypoechoic mass with irregular borders 2:30 position 2 cm left axilla: 1 abnormal lymph node, 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 Neoadjuvant chemotherapy with TCHP x6 followed by HP maintenance versus Kadcyla maintenance 2. Followed by breast conserving surgery with targeted axillary dissection 3. Followed by adjuvant radiation therapy 4.  Followed by adjuvant antiestrogen therapy -------------------------------------------------------------------------------------------------------------------------------------- Current treatment: Cycle 3 TCHP Echocardiogram 08/03/2021: EF 65 to 70%    Chemotoxicities: Mild nausea Mild loose stool: sent prescription for Lomotil Mild fatigue Chemo induced anemia: monitoring closely Alopecia   No bone pain   Return to clinic in 3 weeks for cycle 4   No orders of the defined types were placed in this encounter.  The patient has a good understanding of the overall plan. she agrees with it. she will call with any problems that may develop before the next visit here. Total time spent: 30 mins including face to face time and time spent for planning, charting and co-ordination of care   Harriette Ohara, MD 10/03/21  I Gardiner Coins am scribing for Dr. Lindi Adie  I have reviewed the above documentation for accuracy and completeness, and I agree with the above.

## 2021-10-05 ENCOUNTER — Inpatient Hospital Stay: Payer: Medicare Other

## 2021-10-05 VITALS — BP 114/62 | HR 76 | Temp 97.5°F | Resp 16

## 2021-10-05 DIAGNOSIS — Z5112 Encounter for antineoplastic immunotherapy: Secondary | ICD-10-CM | POA: Diagnosis not present

## 2021-10-05 DIAGNOSIS — Z170 Estrogen receptor positive status [ER+]: Secondary | ICD-10-CM

## 2021-10-05 MED ORDER — PEGFILGRASTIM-BMEZ 6 MG/0.6ML ~~LOC~~ SOSY
6.0000 mg | PREFILLED_SYRINGE | Freq: Once | SUBCUTANEOUS | Status: AC
  Administered 2021-10-05: 6 mg via SUBCUTANEOUS

## 2021-10-21 ENCOUNTER — Other Ambulatory Visit: Payer: Self-pay

## 2021-10-23 MED FILL — Dexamethasone Sodium Phosphate Inj 100 MG/10ML: INTRAMUSCULAR | Qty: 1 | Status: AC

## 2021-10-23 MED FILL — Fosaprepitant Dimeglumine For IV Infusion 150 MG (Base Eq): INTRAVENOUS | Qty: 5 | Status: AC

## 2021-10-23 NOTE — Progress Notes (Signed)
Patient Care Team: [place], Blondell Reveal, MD as PCP - General (Family Medicine) Mauro Kaufmann, RN as Oncology Nurse Navigator Rockwell Germany, RN as Oncology Nurse Navigator Nicholas Lose, MD as Consulting Physician (Hematology and Oncology) Minus Breeding, MD as Consulting Physician (Cardiology) Paralee Cancel, MD as Consulting Physician (Orthopedic Surgery)  DIAGNOSIS:  Encounter Diagnosis  Name Primary?   Malignant neoplasm of upper-outer quadrant of left breast in female, estrogen receptor positive ([place])     SUMMARY OF ONCOLOGIC HISTORY: Oncology History  Malignant neoplasm of upper-outer quadrant of left breast, estrogen receptor positive ([place])  07/01/2021 Initial Diagnosis   Screening mammogram 06/27/2021 detected abnormality in the left breast which led to diagnostic mammogram and left breast ultrasound that revealed hypoechoic mass with irregular borders 2:30 position 2 cm left axilla: 1 abnormal lymph node, grade 2 IDC with DCIS ER 99%, PR 95%, HER2 2+ by IHC, FISH positive, Ki-67 20%, left axilla lymph node: Positive   08/08/2021 Cancer Staging   Staging form: Breast, AJCC 8th Edition - Clinical stage from 08/08/2021: Stage IB (cT1c, cN1(f), cM0, G2, ER+, PR+, HER2+) - Signed by Nicholas Lose, MD on 08/08/2021 Stage prefix: Initial diagnosis Method of lymph node assessment: Core biopsy Histologic grading system: 3 grade system   08/22/2021 -  Chemotherapy   Patient is on Treatment Plan : BREAST  Docetaxel + Carboplatin + Trastuzumab + Pertuzumab  (TCHP) q21d        CHIEF COMPLIANT: Cycle 4 TCHP  INTERVAL HISTORY: Amber Mccormick is a 66 y.o female is here because of recent diagnosis of left breast cancer. She presents to the clinic today for a follow-up.  After last cycle of chemotherapy for the first week after chemo she felt extremely fatigued.  She does have intermittent diarrhea but she takes Imodium as needed.  She denies any nausea or vomiting.  Denies any peripheral  neuropathy.   ALLERGIES:  has No Known Allergies.  MEDICATIONS:  Current Outpatient Medications  Medication Sig Dispense Refill   amitriptyline (ELAVIL) 10 MG tablet Take 10 mg by mouth at bedtime.     atorvastatin (LIPITOR) 10 MG tablet Take 10 mg by mouth daily.     cholecalciferol (VITAMIN D3) 25 MCG (1000 UNIT) tablet Take 1,000 Units by mouth daily.     dexamethasone (DECADRON) 4 MG tablet Take 1 tablet (4 mg total) by mouth 2 (two) times daily. 1 tablet day before chemo 1 tablet day after chemo with food 30 tablet 1   diphenhydrAMINE (BENADRYL) 25 MG tablet Take 25 mg by mouth every 6 (six) hours as needed.     diphenoxylate-atropine (LOMOTIL) 2.5-0.025 MG tablet Take 1 tablet by mouth 4 (four) times daily as needed for diarrhea or loose stools. 30 tablet 3   ibuprofen (ADVIL) 800 MG tablet Take 1 tablet (800 mg total) by mouth every 8 (eight) hours as needed. 30 tablet 0   levothyroxine (SYNTHROID) 50 MCG tablet Take 50 mcg by mouth daily.     lidocaine-prilocaine (EMLA) cream Apply to affected area once 30 g 3   metroNIDAZOLE (METROCREAM) 0.75 % cream Apply topically 2 (two) times daily as needed.     ondansetron (ZOFRAN) 8 MG tablet Take 1 tablet (8 mg total) by mouth 2 (two) times daily as needed (Nausea or vomiting). Start on the third day after chemotherapy. 30 tablet 1   oxyCODONE (OXY IR/ROXICODONE) 5 MG immediate release tablet Take 1 tablet (5 mg total) by mouth every 6 (six) hours as needed  for severe pain. 15 tablet 0   prochlorperazine (COMPAZINE) 10 MG tablet Take 1 tablet (10 mg total) by mouth every 6 (six) hours as needed (Nausea or vomiting). 30 tablet 1   vitamin B-12 (CYANOCOBALAMIN) 500 MCG tablet Take by mouth.     vitamin C (ASCORBIC ACID) 500 MG tablet Take 500 mg by mouth daily.     No current facility-administered medications for this visit.   Facility-Administered Medications Ordered in Other Visits  Medication Dose Route Frequency Provider Last Rate Last  Admin   CARBOplatin (PARAPLATIN) 350 mg in sodium chloride 0.9 % 100 mL chemo infusion  350 mg Intravenous Once Nicholas Lose, MD       DOCEtaxel (TAXOTERE) 90 mg in sodium chloride 0.9 % 250 mL chemo infusion  50 mg/m2 (Order-Specific) Intravenous Once Nicholas Lose, MD       pertuzumab (PERJETA) 420 mg in sodium chloride 0.9 % 250 mL chemo infusion  420 mg Intravenous Once Nicholas Lose, MD 528 mL/hr at 10/24/21 1238 420 mg at 10/24/21 1238    PHYSICAL EXAMINATION: ECOG PERFORMANCE STATUS: 1 - Symptomatic but completely ambulatory  Vitals:   10/24/21 1030  BP: (!) 149/82  Pulse: 73  Resp: 17  Temp: 97.7 F (36.5 C)  SpO2: 100%   Filed Weights   10/24/21 1030  Weight: 153 lb 1.6 oz (69.4 kg)      LABORATORY DATA:  I have reviewed the data as listed    Latest Ref Rng & Units 10/24/2021    9:57 AM 10/03/2021   10:17 AM 09/12/2021   10:15 AM  CMP  Glucose 70 - 99 mg/dL 100  76  100   BUN 8 - 23 mg/dL '15  15  17   ' Creatinine 0.44 - 1.00 mg/dL 0.59  0.59  0.62   Sodium 135 - 145 mmol/L 140  C 140  140   Potassium 3.5 - 5.1 mmol/L 3.8  3.6  3.6   Chloride 98 - 111 mmol/L 106  C 106  104   CO2 22 - 32 mmol/L 32  C 28  31   Calcium 8.9 - 10.3 mg/dL 9.2  9.1  9.2   Total Protein 6.5 - 8.1 g/dL 6.6  6.6  6.3   Total Bilirubin 0.3 - 1.2 mg/dL 0.4  0.4  0.4   Alkaline Phos 38 - 126 U/L 78  81  71   AST 15 - 41 U/L '26  17  15   ' ALT 0 - 44 U/L '17  15  17     ' C Corrected result    Lab Results  Component Value Date   WBC 7.4 10/24/2021   HGB 11.2 (L) 10/24/2021   HCT 33.4 (L) 10/24/2021   MCV 91.3 10/24/2021   PLT 218 10/24/2021   NEUTROABS 4.1 10/24/2021    ASSESSMENT & PLAN:  Malignant neoplasm of upper-outer quadrant of left breast, estrogen receptor positive ([place]) 07/01/2021:Screening mammogram 06/27/2021 detected abnormality in the left breast which led to diagnostic mammogram and left breast ultrasound that revealed hypoechoic mass with irregular borders 2:30 position 2  cm left axilla: 1 abnormal lymph node, 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 Neoadjuvant chemotherapy with TCHP x6 followed by HP maintenance versus Kadcyla maintenance 2. Followed by breast conserving surgery with targeted axillary dissection 3. Followed by adjuvant radiation therapy 4.  Followed by adjuvant  antiestrogen therapy -------------------------------------------------------------------------------------------------------------------------------------- Current treatment: Cycle 4 TCHP Echocardiogram 08/03/2021: EF 65 to 70%   Chemotoxicities: Mild nausea Mild loose stool: sent prescription for Lomotil Mild fatigue Chemo induced anemia: monitoring closely Alopecia   No bone pain   Return to clinic in 3 weeks for cycle 5    No orders of the defined types were placed in this encounter.  The patient has a good understanding of the overall plan. she agrees with it. she will call with any problems that may develop before the next visit here. Total time spent: 30 mins including face to face time and time spent for planning, charting and co-ordination of care   Harriette Ohara, MD 10/24/21    I Gardiner Coins am scribing for Dr. Lindi Adie  I have reviewed the above documentation for accuracy and completeness, and I agree with the above.

## 2021-10-24 ENCOUNTER — Inpatient Hospital Stay: Payer: Medicare Other

## 2021-10-24 ENCOUNTER — Other Ambulatory Visit: Payer: Self-pay

## 2021-10-24 ENCOUNTER — Encounter: Payer: Self-pay | Admitting: *Deleted

## 2021-10-24 ENCOUNTER — Inpatient Hospital Stay (HOSPITAL_BASED_OUTPATIENT_CLINIC_OR_DEPARTMENT_OTHER): Payer: Medicare Other | Admitting: Hematology and Oncology

## 2021-10-24 VITALS — BP 136/77 | HR 72 | Temp 98.6°F | Resp 18

## 2021-10-24 DIAGNOSIS — Z170 Estrogen receptor positive status [ER+]: Secondary | ICD-10-CM

## 2021-10-24 DIAGNOSIS — Z95828 Presence of other vascular implants and grafts: Secondary | ICD-10-CM

## 2021-10-24 DIAGNOSIS — C50412 Malignant neoplasm of upper-outer quadrant of left female breast: Secondary | ICD-10-CM

## 2021-10-24 DIAGNOSIS — Z5112 Encounter for antineoplastic immunotherapy: Secondary | ICD-10-CM | POA: Diagnosis not present

## 2021-10-24 LAB — CMP (CANCER CENTER ONLY)
ALT: 17 U/L (ref 0–44)
AST: 26 U/L (ref 15–41)
Albumin: 4 g/dL (ref 3.5–5.0)
Alkaline Phosphatase: 78 U/L (ref 38–126)
Anion gap: 2 — ABNORMAL LOW (ref 5–15)
BUN: 15 mg/dL (ref 8–23)
CO2: 32 mmol/L (ref 22–32)
Calcium: 9.2 mg/dL (ref 8.9–10.3)
Chloride: 106 mmol/L (ref 98–111)
Creatinine: 0.59 mg/dL (ref 0.44–1.00)
GFR, Estimated: >60 mL/min (ref >60)
Glucose, Bld: 100 mg/dL — ABNORMAL HIGH (ref 70–99)
Potassium: 3.8 mmol/L (ref 3.5–5.1)
Sodium: 140 mmol/L (ref 135–145)
Total Bilirubin: 0.4 mg/dL (ref 0.3–1.2)
Total Protein: 6.6 g/dL (ref 6.5–8.1)

## 2021-10-24 LAB — CBC WITH DIFFERENTIAL (CANCER CENTER ONLY)
Abs Immature Granulocytes: 0.02 10*3/uL (ref 0.00–0.07)
Basophils Absolute: 0 10*3/uL (ref 0.0–0.1)
Basophils Relative: 0 %
Eosinophils Absolute: 0 10*3/uL (ref 0.0–0.5)
Eosinophils Relative: 0 %
HCT: 33.4 % — ABNORMAL LOW (ref 36.0–46.0)
Hemoglobin: 11.2 g/dL — ABNORMAL LOW (ref 12.0–15.0)
Immature Granulocytes: 0 %
Lymphocytes Relative: 35 %
Lymphs Abs: 2.6 10*3/uL (ref 0.7–4.0)
MCH: 30.6 pg (ref 26.0–34.0)
MCHC: 33.5 g/dL (ref 30.0–36.0)
MCV: 91.3 fL (ref 80.0–100.0)
Monocytes Absolute: 0.6 10*3/uL (ref 0.1–1.0)
Monocytes Relative: 9 %
Neutro Abs: 4.1 10*3/uL (ref 1.7–7.7)
Neutrophils Relative %: 56 %
Platelet Count: 218 10*3/uL (ref 150–400)
RBC: 3.66 MIL/uL — ABNORMAL LOW (ref 3.87–5.11)
RDW: 16.3 % — ABNORMAL HIGH (ref 11.5–15.5)
WBC Count: 7.4 10*3/uL (ref 4.0–10.5)
nRBC: 0 % (ref 0.0–0.2)

## 2021-10-24 MED ORDER — PALONOSETRON HCL INJECTION 0.25 MG/5ML
0.2500 mg | Freq: Once | INTRAVENOUS | Status: AC
  Administered 2021-10-24: 0.25 mg via INTRAVENOUS
  Filled 2021-10-24: qty 5

## 2021-10-24 MED ORDER — ACETAMINOPHEN 325 MG PO TABS
650.0000 mg | ORAL_TABLET | Freq: Once | ORAL | Status: AC
  Administered 2021-10-24: 650 mg via ORAL
  Filled 2021-10-24: qty 2

## 2021-10-24 MED ORDER — SODIUM CHLORIDE 0.9% FLUSH
10.0000 mL | Freq: Once | INTRAVENOUS | Status: AC
  Administered 2021-10-24: 10 mL

## 2021-10-24 MED ORDER — SODIUM CHLORIDE 0.9 % IV SOLN
10.0000 mg | Freq: Once | INTRAVENOUS | Status: AC
  Administered 2021-10-24: 10 mg via INTRAVENOUS
  Filled 2021-10-24: qty 10

## 2021-10-24 MED ORDER — SODIUM CHLORIDE 0.9 % IV SOLN
347.2000 mg | Freq: Once | INTRAVENOUS | Status: AC
  Administered 2021-10-24: 350 mg via INTRAVENOUS
  Filled 2021-10-24: qty 35

## 2021-10-24 MED ORDER — SODIUM CHLORIDE 0.9 % IV SOLN
420.0000 mg | Freq: Once | INTRAVENOUS | Status: AC
  Administered 2021-10-24: 420 mg via INTRAVENOUS
  Filled 2021-10-24: qty 14

## 2021-10-24 MED ORDER — SODIUM CHLORIDE 0.9 % IV SOLN: Freq: Once | INTRAVENOUS | Status: AC

## 2021-10-24 MED ORDER — SODIUM CHLORIDE 0.9 % IV SOLN
150.0000 mg | Freq: Once | INTRAVENOUS | Status: AC
  Administered 2021-10-24: 150 mg via INTRAVENOUS
  Filled 2021-10-24: qty 150

## 2021-10-24 MED ORDER — HEPARIN SOD (PORK) LOCK FLUSH 100 UNIT/ML IV SOLN
500.0000 [IU] | Freq: Once | INTRAVENOUS | Status: AC
  Administered 2021-10-24: 500 [IU]

## 2021-10-24 MED ORDER — DIPHENHYDRAMINE HCL 25 MG PO CAPS
50.0000 mg | ORAL_CAPSULE | Freq: Once | ORAL | Status: AC
  Administered 2021-10-24: 50 mg via ORAL
  Filled 2021-10-24: qty 2

## 2021-10-24 MED ORDER — TRASTUZUMAB-ANNS CHEMO 150 MG IV SOLR
6.0000 mg/kg | Freq: Once | INTRAVENOUS | Status: AC
  Administered 2021-10-24: 420 mg via INTRAVENOUS
  Filled 2021-10-24: qty 20

## 2021-10-24 MED ORDER — SODIUM CHLORIDE 0.9 % IV SOLN
50.0000 mg/m2 | Freq: Once | INTRAVENOUS | Status: AC
  Administered 2021-10-24: 90 mg via INTRAVENOUS
  Filled 2021-10-24: qty 9

## 2021-10-24 NOTE — Patient Instructions (Signed)
[facility]  Discharge Instructions: Thank you for choosing [facility] to provide your oncology and hematology care.   If you have a lab appointment with the [facility], please go directly to the [facility] and check in at the registration area.   Wear comfortable clothing and clothing appropriate for easy access to any Portacath or PICC line.   We strive to give you quality time with your provider. You may need to reschedule your appointment if you arrive late (15 or more minutes).  Arriving late affects you and other patients whose appointments are after yours.  Also, if you miss three or more appointments without notifying the office, you may be dismissed from the clinic at the provider's discretion.      For prescription refill requests, have your pharmacy contact our office and allow 72 hours for refills to be completed.    Today you received the following chemotherapy and/or immunotherapy agents :  Trastuzumab,  Pertuzumab,  Docetaxel,  Carboplatin.   To help prevent nausea and vomiting after your treatment, we encourage you to take your nausea medication as directed.  BELOW ARE SYMPTOMS THAT SHOULD BE REPORTED IMMEDIATELY: *FEVER GREATER THAN 100.4 F (38 C) OR HIGHER *CHILLS OR SWEATING *NAUSEA AND VOMITING THAT IS NOT CONTROLLED WITH YOUR NAUSEA MEDICATION *UNUSUAL SHORTNESS OF BREATH *UNUSUAL BRUISING OR BLEEDING *URINARY PROBLEMS (pain or burning when urinating, or frequent urination) *BOWEL PROBLEMS (unusual diarrhea, constipation, pain near the anus) TENDERNESS IN MOUTH AND THROAT WITH OR WITHOUT PRESENCE OF ULCERS (sore throat, sores in mouth, or a toothache) UNUSUAL RASH, SWELLING OR PAIN  UNUSUAL VAGINAL DISCHARGE OR ITCHING   Items with * indicate a potential emergency and should be followed up as soon as possible or go to the Emergency Department if any problems should occur.  Please show the CHEMOTHERAPY ALERT CARD or  IMMUNOTHERAPY ALERT CARD at check-in to the Emergency Department and triage nurse.  Should you have questions after your visit or need to cancel or reschedule your appointment, please contact [facility]  Dept: [phone]  and follow the prompts.  Office hours are 8:00 a.m. to 4:30 p.m. Monday - Friday. Please note that voicemails left after 4:00 p.m. may not be returned until the following business day.  We are closed weekends and major holidays. You have access to a nurse at all times for urgent questions. Please call the main number to the clinic Dept: [phone] and follow the prompts.   For any non-urgent questions, you may also contact your provider using MyChart. We now offer e-Visits for anyone 41 and older to request care online for non-urgent symptoms. For details visit [email or web address].   Also download the MyChart app! Go to the app store, search "MyChart", open the app, select [facility], and log in with your MyChart username and password.  Masks are optional in the cancer centers. If you would like for your care team to wear a mask while they are taking care of you, please let them know. For doctor visits, patients may have with them one support person who is at least 68 years old. At this time, visitors are not allowed in the infusion area.

## 2021-10-24 NOTE — Assessment & Plan Note (Signed)
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 Neoadjuvant chemotherapy withTCHP x6 followed by HP maintenance versus Kadcyla maintenance 2. Followed by breast conserving surgery with targeted axillary dissection 3. Followed by adjuvant radiation therapy 4.Followed by adjuvant antiestrogen therapy -------------------------------------------------------------------------------------------------------------------------------------- Current treatment:Cycle4TCHP Echocardiogram 08/03/2021: EF 65 to 70%  Chemotoxicities: 1. Mild nausea 2. Mild loose stool: sent prescription for Lomotil 3. Mild fatigue 4. Chemo induced anemia: monitoring closely 5. Alopecia  No bone pain  Return to clinic in3weeks for cycle 5

## 2021-10-26 ENCOUNTER — Inpatient Hospital Stay: Payer: Medicare Other

## 2021-10-26 ENCOUNTER — Other Ambulatory Visit: Payer: Self-pay

## 2021-10-26 VITALS — BP 129/61 | HR 77 | Temp 97.7°F

## 2021-10-26 DIAGNOSIS — Z5112 Encounter for antineoplastic immunotherapy: Secondary | ICD-10-CM | POA: Diagnosis not present

## 2021-10-26 DIAGNOSIS — C50412 Malignant neoplasm of upper-outer quadrant of left female breast: Secondary | ICD-10-CM

## 2021-10-26 MED ORDER — PEGFILGRASTIM-BMEZ 6 MG/0.6ML ~~LOC~~ SOSY
6.0000 mg | PREFILLED_SYRINGE | Freq: Once | SUBCUTANEOUS | Status: AC
  Administered 2021-10-26: 6 mg via SUBCUTANEOUS
  Filled 2021-10-26: qty 0.6

## 2021-10-26 NOTE — Patient Instructions (Signed)

## 2021-11-13 MED FILL — Fosaprepitant Dimeglumine For IV Infusion 150 MG (Base Eq): INTRAVENOUS | Qty: 5 | Status: AC

## 2021-11-13 MED FILL — Dexamethasone Sodium Phosphate Inj 100 MG/10ML: INTRAMUSCULAR | Qty: 1 | Status: AC

## 2021-11-13 NOTE — Progress Notes (Signed)
Patient Care Team: [place], Blondell Reveal, MD as PCP - General (Family Medicine) Mauro Kaufmann, RN as Oncology Nurse Navigator Rockwell Germany, RN as Oncology Nurse Navigator Nicholas Lose, MD as Consulting Physician (Hematology and Oncology) Minus Breeding, MD as Consulting Physician (Cardiology) Paralee Cancel, MD as Consulting Physician (Orthopedic Surgery)  DIAGNOSIS: No diagnosis found.  SUMMARY OF ONCOLOGIC HISTORY: Oncology History  Malignant neoplasm of upper-outer quadrant of left breast, estrogen receptor positive ([place])  07/01/2021 Initial Diagnosis   Screening mammogram 06/27/2021 detected abnormality in the left breast which led to diagnostic mammogram and left breast ultrasound that revealed hypoechoic mass with irregular borders 2:30 position 2 cm left axilla: 1 abnormal lymph node, grade 2 IDC with DCIS ER 99%, PR 95%, HER2 2+ by IHC, FISH positive, Ki-67 20%, left axilla lymph node: Positive   08/08/2021 Cancer Staging   Staging form: Breast, AJCC 8th Edition - Clinical stage from 08/08/2021: Stage IB (cT1c, cN1(f), cM0, G2, ER+, PR+, HER2+) - Signed by Nicholas Lose, MD on 08/08/2021 Stage prefix: Initial diagnosis Method of lymph node assessment: Core biopsy Histologic grading system: 3 grade system   08/22/2021 -  Chemotherapy   Patient is on Treatment Plan : BREAST  Docetaxel + Carboplatin + Trastuzumab + Pertuzumab  (TCHP) q21d        CHIEF COMPLIANT: Cycle 4 TCHP  INTERVAL HISTORY: Nature Kueker is a 66 y.o female is here because of recent diagnosis of left breast cancer. She presents to the clinic today for a follow-up   ALLERGIES:  has No Known Allergies.  MEDICATIONS:  Current Outpatient Medications  Medication Sig Dispense Refill   amitriptyline (ELAVIL) 10 MG tablet Take 10 mg by mouth at bedtime.     atorvastatin (LIPITOR) 10 MG tablet Take 10 mg by mouth daily.     cholecalciferol (VITAMIN D3) 25 MCG (1000 UNIT) tablet Take 1,000 Units by mouth daily.      dexamethasone (DECADRON) 4 MG tablet Take 1 tablet (4 mg total) by mouth 2 (two) times daily. 1 tablet day before chemo 1 tablet day after chemo with food 30 tablet 1   diphenhydrAMINE (BENADRYL) 25 MG tablet Take 25 mg by mouth every 6 (six) hours as needed.     diphenoxylate-atropine (LOMOTIL) 2.5-0.025 MG tablet Take 1 tablet by mouth 4 (four) times daily as needed for diarrhea or loose stools. 30 tablet 3   ibuprofen (ADVIL) 800 MG tablet Take 1 tablet (800 mg total) by mouth every 8 (eight) hours as needed. 30 tablet 0   levothyroxine (SYNTHROID) 50 MCG tablet Take 50 mcg by mouth daily.     lidocaine-prilocaine (EMLA) cream Apply to affected area once 30 g 3   metroNIDAZOLE (METROCREAM) 0.75 % cream Apply topically 2 (two) times daily as needed.     ondansetron (ZOFRAN) 8 MG tablet Take 1 tablet (8 mg total) by mouth 2 (two) times daily as needed (Nausea or vomiting). Start on the third day after chemotherapy. 30 tablet 1   oxyCODONE (OXY IR/ROXICODONE) 5 MG immediate release tablet Take 1 tablet (5 mg total) by mouth every 6 (six) hours as needed for severe pain. 15 tablet 0   prochlorperazine (COMPAZINE) 10 MG tablet Take 1 tablet (10 mg total) by mouth every 6 (six) hours as needed (Nausea or vomiting). 30 tablet 1   vitamin B-12 (CYANOCOBALAMIN) 500 MCG tablet Take by mouth.     vitamin C (ASCORBIC ACID) 500 MG tablet Take 500 mg by mouth daily.  No current facility-administered medications for this visit.    PHYSICAL EXAMINATION: ECOG PERFORMANCE STATUS: {CHL ONC ECOG PS:[id number]}  There were no vitals filed for this visit. There were no vitals filed for this visit.  BREAST:*** No palpable masses or nodules in either right or left breasts. No palpable axillary supraclavicular or infraclavicular adenopathy no breast tenderness or nipple discharge. (exam performed in the presence of a chaperone)  LABORATORY DATA:  I have reviewed the data as listed    Latest Ref Rng &  Units 10/24/2021    9:57 AM 10/03/2021   10:17 AM 09/12/2021   10:15 AM  CMP  Glucose 70 - 99 mg/dL 100  76  100   BUN 8 - 23 mg/dL '15  15  17   ' Creatinine 0.44 - 1.00 mg/dL 0.59  0.59  0.62   Sodium 135 - 145 mmol/L 140  C 140  140   Potassium 3.5 - 5.1 mmol/L 3.8  3.6  3.6   Chloride 98 - 111 mmol/L 106  C 106  104   CO2 22 - 32 mmol/L 32  C 28  31   Calcium 8.9 - 10.3 mg/dL 9.2  9.1  9.2   Total Protein 6.5 - 8.1 g/dL 6.6  6.6  6.3   Total Bilirubin 0.3 - 1.2 mg/dL 0.4  0.4  0.4   Alkaline Phos 38 - 126 U/L 78  81  71   AST 15 - 41 U/L '26  17  15   ' ALT 0 - 44 U/L '17  15  17     ' C Corrected result    Lab Results  Component Value Date   WBC 7.4 10/24/2021   HGB 11.2 (L) 10/24/2021   HCT 33.4 (L) 10/24/2021   MCV 91.3 10/24/2021   PLT 218 10/24/2021   NEUTROABS 4.1 10/24/2021    ASSESSMENT & PLAN:  No problem-specific Assessment & Plan notes found for this encounter.    No orders of the defined types were placed in this encounter.  The patient has a good understanding of the overall plan. she agrees with it. she will call with any problems that may develop before the next visit here. Total time spent: 30 mins including face to face time and time spent for planning, charting and co-ordination of care   Suzzette Righter, [place] 11/13/21    I Gardiner Coins am scribing for Dr. Lindi Adie  ***

## 2021-11-14 ENCOUNTER — Inpatient Hospital Stay: Payer: Medicare Other

## 2021-11-14 ENCOUNTER — Encounter: Payer: Self-pay | Admitting: *Deleted

## 2021-11-14 ENCOUNTER — Other Ambulatory Visit: Payer: Self-pay

## 2021-11-14 ENCOUNTER — Inpatient Hospital Stay (HOSPITAL_BASED_OUTPATIENT_CLINIC_OR_DEPARTMENT_OTHER): Payer: Medicare Other | Admitting: Hematology and Oncology

## 2021-11-14 ENCOUNTER — Other Ambulatory Visit: Payer: Self-pay | Admitting: Physician Assistant

## 2021-11-14 ENCOUNTER — Inpatient Hospital Stay: Payer: Medicare Other | Attending: Hematology and Oncology

## 2021-11-14 DIAGNOSIS — Z5189 Encounter for other specified aftercare: Secondary | ICD-10-CM | POA: Diagnosis not present

## 2021-11-14 DIAGNOSIS — Z5112 Encounter for antineoplastic immunotherapy: Secondary | ICD-10-CM | POA: Diagnosis present

## 2021-11-14 DIAGNOSIS — C50412 Malignant neoplasm of upper-outer quadrant of left female breast: Secondary | ICD-10-CM | POA: Insufficient documentation

## 2021-11-14 DIAGNOSIS — Z79899 Other long term (current) drug therapy: Secondary | ICD-10-CM | POA: Insufficient documentation

## 2021-11-14 DIAGNOSIS — Z170 Estrogen receptor positive status [ER+]: Secondary | ICD-10-CM | POA: Diagnosis not present

## 2021-11-14 DIAGNOSIS — Z95828 Presence of other vascular implants and grafts: Secondary | ICD-10-CM

## 2021-11-14 DIAGNOSIS — C773 Secondary and unspecified malignant neoplasm of axilla and upper limb lymph nodes: Secondary | ICD-10-CM | POA: Diagnosis not present

## 2021-11-14 DIAGNOSIS — Z5111 Encounter for antineoplastic chemotherapy: Secondary | ICD-10-CM | POA: Diagnosis present

## 2021-11-14 LAB — CMP (CANCER CENTER ONLY)
ALT: 14 U/L (ref 0–44)
AST: 18 U/L (ref 15–41)
Albumin: 4.2 g/dL (ref 3.5–5.0)
Alkaline Phosphatase: 91 U/L (ref 38–126)
Anion gap: 6 (ref 5–15)
BUN: 15 mg/dL (ref 8–23)
CO2: 29 mmol/L (ref 22–32)
Calcium: 9.3 mg/dL (ref 8.9–10.3)
Chloride: 106 mmol/L (ref 98–111)
Creatinine: 0.56 mg/dL (ref 0.44–1.00)
GFR, Estimated: >60 mL/min (ref >60)
Glucose, Bld: 67 mg/dL — ABNORMAL LOW (ref 70–99)
Potassium: 3.2 mmol/L — ABNORMAL LOW (ref 3.5–5.1)
Sodium: 141 mmol/L (ref 135–145)
Total Bilirubin: 0.4 mg/dL (ref 0.3–1.2)
Total Protein: 6.8 g/dL (ref 6.5–8.1)

## 2021-11-14 LAB — CBC WITH DIFFERENTIAL (CANCER CENTER ONLY)
Abs Immature Granulocytes: 0.03 10*3/uL (ref 0.00–0.07)
Basophils Absolute: 0.1 10*3/uL (ref 0.0–0.1)
Basophils Relative: 0 %
Eosinophils Absolute: 0 10*3/uL (ref 0.0–0.5)
Eosinophils Relative: 0 %
HCT: 34.4 % — ABNORMAL LOW (ref 36.0–46.0)
Hemoglobin: 11.7 g/dL — ABNORMAL LOW (ref 12.0–15.0)
Immature Granulocytes: 0 %
Lymphocytes Relative: 34 %
Lymphs Abs: 4.2 10*3/uL — ABNORMAL HIGH (ref 0.7–4.0)
MCH: 31 pg (ref 26.0–34.0)
MCHC: 34 g/dL (ref 30.0–36.0)
MCV: 91.2 fL (ref 80.0–100.0)
Monocytes Absolute: 1 10*3/uL (ref 0.1–1.0)
Monocytes Relative: 8 %
Neutro Abs: 6.9 10*3/uL (ref 1.7–7.7)
Neutrophils Relative %: 58 %
Platelet Count: 209 10*3/uL (ref 150–400)
RBC: 3.77 MIL/uL — ABNORMAL LOW (ref 3.87–5.11)
RDW: 15.8 % — ABNORMAL HIGH (ref 11.5–15.5)
WBC Count: 12.1 10*3/uL — ABNORMAL HIGH (ref 4.0–10.5)
nRBC: 0 % (ref 0.0–0.2)

## 2021-11-14 MED ORDER — HEPARIN SOD (PORK) LOCK FLUSH 100 UNIT/ML IV SOLN
500.0000 [IU] | Freq: Once | INTRAVENOUS | Status: AC | PRN
  Administered 2021-11-14: 500 [IU]

## 2021-11-14 MED ORDER — SODIUM CHLORIDE 0.9 % IV SOLN
420.0000 mg | Freq: Once | INTRAVENOUS | Status: AC
  Administered 2021-11-14: 420 mg via INTRAVENOUS
  Filled 2021-11-14: qty 14

## 2021-11-14 MED ORDER — ACETAMINOPHEN 325 MG PO TABS
650.0000 mg | ORAL_TABLET | Freq: Once | ORAL | Status: AC
  Administered 2021-11-14: 650 mg via ORAL
  Filled 2021-11-14: qty 2

## 2021-11-14 MED ORDER — SODIUM CHLORIDE 0.9 % IV SOLN
10.0000 mg | Freq: Once | INTRAVENOUS | Status: AC
  Administered 2021-11-14: 10 mg via INTRAVENOUS
  Filled 2021-11-14: qty 10

## 2021-11-14 MED ORDER — PEGFILGRASTIM 6 MG/0.6ML ~~LOC~~ PSKT
6.0000 mg | PREFILLED_SYRINGE | Freq: Once | SUBCUTANEOUS | Status: AC
  Administered 2021-11-14: 6 mg via SUBCUTANEOUS
  Filled 2021-11-14: qty 0.6

## 2021-11-14 MED ORDER — DIPHENHYDRAMINE HCL 25 MG PO CAPS
50.0000 mg | ORAL_CAPSULE | Freq: Once | ORAL | Status: AC
  Administered 2021-11-14: 50 mg via ORAL
  Filled 2021-11-14: qty 2

## 2021-11-14 MED ORDER — SODIUM CHLORIDE 0.9% FLUSH
10.0000 mL | Freq: Once | INTRAVENOUS | Status: AC
  Administered 2021-11-14: 10 mL

## 2021-11-14 MED ORDER — SODIUM CHLORIDE 0.9% FLUSH
10.0000 mL | INTRAVENOUS | Status: DC | PRN
  Administered 2021-11-14: 10 mL

## 2021-11-14 MED ORDER — SODIUM CHLORIDE 0.9 % IV SOLN
347.2000 mg | Freq: Once | INTRAVENOUS | Status: AC
  Administered 2021-11-14: 350 mg via INTRAVENOUS
  Filled 2021-11-14: qty 35

## 2021-11-14 MED ORDER — SODIUM CHLORIDE 0.9 % IV SOLN
50.0000 mg/m2 | Freq: Once | INTRAVENOUS | Status: AC
  Administered 2021-11-14: 90 mg via INTRAVENOUS
  Filled 2021-11-14: qty 9

## 2021-11-14 MED ORDER — TRASTUZUMAB-ANNS CHEMO 150 MG IV SOLR
6.0000 mg/kg | Freq: Once | INTRAVENOUS | Status: AC
  Administered 2021-11-14: 420 mg via INTRAVENOUS
  Filled 2021-11-14: qty 20

## 2021-11-14 MED ORDER — PALONOSETRON HCL INJECTION 0.25 MG/5ML
0.2500 mg | Freq: Once | INTRAVENOUS | Status: AC
  Administered 2021-11-14: 0.25 mg via INTRAVENOUS
  Filled 2021-11-14: qty 5

## 2021-11-14 MED ORDER — SODIUM CHLORIDE 0.9 % IV SOLN
150.0000 mg | Freq: Once | INTRAVENOUS | Status: AC
  Administered 2021-11-14: 150 mg via INTRAVENOUS
  Filled 2021-11-14: qty 150

## 2021-11-14 MED ORDER — SODIUM CHLORIDE 0.9 % IV SOLN: Freq: Once | INTRAVENOUS | Status: AC

## 2021-11-14 NOTE — Assessment & Plan Note (Addendum)
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 Neoadjuvant chemotherapy withTCHP x6 followed by HP maintenance versus Kadcyla maintenance 2. Followed by breast conserving surgery with targeted axillary dissection 3. Followed by adjuvant radiation therapy 4.Followed by adjuvant antiestrogen therapy -------------------------------------------------------------------------------------------------------------------------------------- Current treatment:Cycle5TCHP Echocardiogram 08/03/2021: EF 65 to 70%  Chemotoxicities: 1. Mild nausea 2. Mild loose stool: sent prescription for Lomotil 3. Mild fatigue 4. Chemo induced anemia: monitoring closely 5. Alopecia  No bone pain I ordered a breast MRI after the last cycle of chemo. I requested Dr. Brantley Stage to see the patient after the MRIs done.  We will discuss her case in the tumor board after the MRI.  Return to clinic in3weeks for cycle6

## 2021-11-14 NOTE — Patient Instructions (Signed)
[facility]  Discharge Instructions: Thank you for choosing [facility] to provide your oncology and hematology care.   If you have a lab appointment with the [facility], please go directly to the [facility] and check in at the registration area.   Wear comfortable clothing and clothing appropriate for easy access to any Portacath or PICC line.   We strive to give you quality time with your provider. You may need to reschedule your appointment if you arrive late (15 or more minutes).  Arriving late affects you and other patients whose appointments are after yours.  Also, if you miss three or more appointments without notifying the office, you may be dismissed from the clinic at the provider's discretion.      For prescription refill requests, have your pharmacy contact our office and allow 72 hours for refills to be completed.    Today you received the following chemotherapy and/or immunotherapy agents: Trastuzumab, pertuzumab, docetaxel, carboplatin      To help prevent nausea and vomiting after your treatment, we encourage you to take your nausea medication as directed.  BELOW ARE SYMPTOMS THAT SHOULD BE REPORTED IMMEDIATELY: *FEVER GREATER THAN 100.4 F (38 C) OR HIGHER *CHILLS OR SWEATING *NAUSEA AND VOMITING THAT IS NOT CONTROLLED WITH YOUR NAUSEA MEDICATION *UNUSUAL SHORTNESS OF BREATH *UNUSUAL BRUISING OR BLEEDING *URINARY PROBLEMS (pain or burning when urinating, or frequent urination) *BOWEL PROBLEMS (unusual diarrhea, constipation, pain near the anus) TENDERNESS IN MOUTH AND THROAT WITH OR WITHOUT PRESENCE OF ULCERS (sore throat, sores in mouth, or a toothache) UNUSUAL RASH, SWELLING OR PAIN  UNUSUAL VAGINAL DISCHARGE OR ITCHING   Items with * indicate a potential emergency and should be followed up as soon as possible or go to the Emergency Department if any problems should occur.  Please show the CHEMOTHERAPY ALERT CARD or  IMMUNOTHERAPY ALERT CARD at check-in to the Emergency Department and triage nurse.  Should you have questions after your visit or need to cancel or reschedule your appointment, please contact [facility]  Dept: [phone]  and follow the prompts.  Office hours are 8:00 a.m. to 4:30 p.m. Monday - Friday. Please note that voicemails left after 4:00 p.m. may not be returned until the following business day.  We are closed weekends and major holidays. You have access to a nurse at all times for urgent questions. Please call the main number to the clinic Dept: [phone] and follow the prompts.   For any non-urgent questions, you may also contact your provider using MyChart. We now offer e-Visits for anyone 31 and older to request care online for non-urgent symptoms. For details visit [email or web address].   Also download the MyChart app! Go to the app store, search "MyChart", open the app, select [facility], and log in with your MyChart username and password.  Masks are optional in the cancer centers. If you would like for your care team to wear a mask while they are taking care of you, please let them know. You may have one support person who is at least 68 years old accompany you for your appointments.

## 2021-11-15 ENCOUNTER — Other Ambulatory Visit: Payer: Self-pay

## 2021-11-15 ENCOUNTER — Telehealth: Payer: Self-pay | Admitting: Hematology and Oncology

## 2021-11-15 NOTE — Telephone Encounter (Signed)
Scheduled appointment per 8/17 los. Patient is aware. 

## 2021-11-16 ENCOUNTER — Inpatient Hospital Stay: Payer: Medicare Other

## 2021-11-19 ENCOUNTER — Other Ambulatory Visit: Payer: Self-pay | Admitting: Hematology and Oncology

## 2021-11-19 ENCOUNTER — Encounter: Payer: Self-pay | Admitting: *Deleted

## 2021-12-02 ENCOUNTER — Other Ambulatory Visit: Payer: Self-pay | Admitting: Hematology and Oncology

## 2021-12-02 DIAGNOSIS — Z170 Estrogen receptor positive status [ER+]: Secondary | ICD-10-CM

## 2021-12-04 MED FILL — Dexamethasone Sodium Phosphate Inj 100 MG/10ML: INTRAMUSCULAR | Qty: 1 | Status: AC

## 2021-12-04 MED FILL — Fosaprepitant Dimeglumine For IV Infusion 150 MG (Base Eq): INTRAVENOUS | Qty: 5 | Status: AC

## 2021-12-04 NOTE — Progress Notes (Signed)
Patient Care Team: [place], Blondell Reveal, MD as PCP - General (Family Medicine) Mauro Kaufmann, RN as Oncology Nurse Navigator Rockwell Germany, RN as Oncology Nurse Navigator Nicholas Lose, MD as Consulting Physician (Hematology and Oncology) Minus Breeding, MD as Consulting Physician (Cardiology) Paralee Cancel, MD as Consulting Physician (Orthopedic Surgery)  DIAGNOSIS:  Encounter Diagnosis  Name Primary?   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, FISH positive, Ki-67 20%, left axilla lymph node: Positive   08/08/2021 Cancer Staging   Staging form: Breast, AJCC 8th Edition - Clinical stage from 08/08/2021: Stage IB (cT1c, cN1(f), cM0, G2, ER+, PR+, HER2+) - Signed by Nicholas Lose, MD on 08/08/2021 Stage prefix: Initial diagnosis Method of lymph node assessment: Core biopsy Histologic grading system: 3 grade system   08/14/2021 -  Chemotherapy   Patient is on Treatment Plan : BREAST  Docetaxel + Carboplatin + Trastuzumab + Pertuzumab  (TCHP) q21d / Trastuzumab + Pertuzumab q21d     08/22/2021 - 11/14/2021 Chemotherapy   Patient is on Treatment Plan : BREAST  Docetaxel + Carboplatin + Trastuzumab + Pertuzumab  (TCHP) q21d        CHIEF COMPLIANT: Cycle 6 TCHP  INTERVAL HISTORY: Amber Mccormick is a 66 y.o female is here because of recent diagnosis of left breast cancer. She presents to the clinic today for a follow-up. She states that she handle treatment well. She  denies loose stool. Complains of stomach feeling out of shape.   ALLERGIES:  has No Known Allergies.  MEDICATIONS:  Current Outpatient Medications  Medication Sig Dispense Refill   amitriptyline (ELAVIL) 10 MG tablet Take 10 mg by mouth at bedtime.     amLODipine (NORVASC) 5 MG tablet Take 5 mg by mouth daily.     Ascorbic Acid 100 MG CHEW Chew by mouth.     atorvastatin (LIPITOR) 10 MG tablet Take 10 mg by mouth daily.     cholecalciferol (VITAMIN D3) 25 MCG (1000 UNIT) tablet Take 1,000 Units by mouth daily.     diphenhydrAMINE (BENADRYL) 25 MG tablet Take 25 mg by mouth every 6 (six) hours as needed.     diphenoxylate-atropine (LOMOTIL) 2.5-0.025 MG tablet Take 1 tablet by mouth 4 (four) times daily as needed for diarrhea or loose stools. 30 tablet 3   ibuprofen (ADVIL) 800 MG tablet Take 1 tablet (800 mg total) by mouth every 8 (eight) hours as needed. 30 tablet 0   levothyroxine (SYNTHROID) 50 MCG tablet Take 50 mcg by mouth daily.     metroNIDAZOLE (METROCREAM) 0.75 % cream Apply topically 2 (two) times daily as needed.     oxyCODONE (OXY IR/ROXICODONE) 5 MG immediate release tablet Take 1 tablet (5 mg total) by mouth every 6 (six) hours as needed for severe pain. 15 tablet 0   vitamin B-12 (CYANOCOBALAMIN) 500 MCG tablet Take by mouth.     vitamin C (ASCORBIC ACID) 500 MG tablet Take 500 mg by mouth daily.     No current facility-administered medications for  this visit.    PHYSICAL EXAMINATION: ECOG PERFORMANCE STATUS: 1 - Symptomatic but completely ambulatory  Vitals:   12/05/21 0956  BP: (!) 150/78  Pulse: 86  Resp: 16  Temp: 97.7 F (36.5 C)  SpO2: 96%   Filed Weights   12/05/21 0956  Weight: 152 lb 1.6 oz (69 kg)     LABORATORY DATA:  I have reviewed the data as listed    Latest Ref Rng & Units 12/05/2021    9:37 AM 11/14/2021    9:50 AM 10/24/2021    9:57 AM  CMP  Glucose 70 - 99 mg/dL 77  67  100   BUN 8 - 23 mg/dL _0 Creatinine 0.44 - 1.00 mg/dL  0.56  0.56  0.59   Sodium 135 - 145 mmol/L 140  141  140  C  Potassium 3.5 - 5.1 mmol/L 3.4  3.2  3.8   Chloride 98 - 111 mmol/L 107  106  106  C  CO2 22 - 32 mmol/L 30  29  32  C  Calcium 8.9 - 10.3 mg/dL 9.1  9.3  9.2   Total Protein 6.5 - 8.1 g/dL 6.4  6.8  6.6   Total Bilirubin 0.3 - 1.2 mg/dL 0.3  0.4  0.4   Alkaline Phos 38 - 126 U/L 95  91  78   AST 15 - 41 U/L _1 ALT 0 - 44 U/L _2 C Corrected result    Lab Results  Component Value Date   WBC 8.6 12/05/2021   HGB 11.5 (L) 12/05/2021   HCT 33.8 (L) 12/05/2021   MCV 92.9 12/05/2021   PLT 188 12/05/2021   NEUTROABS 5.0 12/05/2021    ASSESSMENT & PLAN:  Malignant neoplasm of upper-outer quadrant of left breast, estrogen receptor positive ([facility]) Malignant neoplasm of upper-outer quadrant of left breast, estrogen receptor positive ([facility]) 07/01/2021:Screening mammogram 06/27/2021 detected abnormality in the left breast which led to diagnostic mammogram and left breast ultrasound that revealed hypoechoic mass with irregular borders 2:30 position 2 cm left axilla: 1 abnormal lymph node, 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 Neoadjuvant chemotherapy with TCHP x6 followed by HP maintenance versus Kadcyla maintenance 2. Followed by breast conserving surgery with targeted axillary dissection 3. Followed by adjuvant radiation therapy 4.  Followed by adjuvant antiestrogen therapy -------------------------------------------------------------------------------------------------------------------------------------- Current treatment: Cycle 6 TCHP Echocardiogram 08/03/2021: EF 65 to 70%   Chemotoxicities: Mild nausea Mild loose stool: sent prescription for Lomotil Mild fatigue Chemo induced anemia: monitoring closely Alopecia   Breast MRI has been ordered.  She has an appointment to see Dr. Brantley Stage for surgery  discussion. She will come back in 3 weeks for Herceptin Perjeta maintenance. Adjuvant treatment will be based upon pathologic results.    No orders of the defined types were placed in this encounter.  The patient has a good understanding of the overall plan. she agrees with it. she will call with any problems that may develop before the next visit here. Total time spent: 30 mins including face to face time and time spent for planning, charting and co-ordination of care   Harriette Ohara, MD 12/05/21    I Gardiner Coins am scribing for Dr. Lindi Adie  I have reviewed the above documentation for accuracy and  completeness, and I agree with the above.

## 2021-12-05 ENCOUNTER — Encounter: Payer: Self-pay | Admitting: *Deleted

## 2021-12-05 ENCOUNTER — Other Ambulatory Visit: Payer: Self-pay

## 2021-12-05 ENCOUNTER — Inpatient Hospital Stay: Payer: Medicare Other

## 2021-12-05 ENCOUNTER — Other Ambulatory Visit: Payer: Self-pay | Admitting: *Deleted

## 2021-12-05 ENCOUNTER — Inpatient Hospital Stay: Payer: Medicare Other | Attending: Hematology and Oncology

## 2021-12-05 ENCOUNTER — Inpatient Hospital Stay (HOSPITAL_BASED_OUTPATIENT_CLINIC_OR_DEPARTMENT_OTHER): Payer: Medicare Other | Admitting: Hematology and Oncology

## 2021-12-05 VITALS — BP 122/74 | HR 81 | Temp 97.6°F | Resp 18

## 2021-12-05 DIAGNOSIS — Z5189 Encounter for other specified aftercare: Secondary | ICD-10-CM | POA: Diagnosis not present

## 2021-12-05 DIAGNOSIS — Z170 Estrogen receptor positive status [ER+]: Secondary | ICD-10-CM

## 2021-12-05 DIAGNOSIS — Z79899 Other long term (current) drug therapy: Secondary | ICD-10-CM | POA: Diagnosis not present

## 2021-12-05 DIAGNOSIS — C50412 Malignant neoplasm of upper-outer quadrant of left female breast: Secondary | ICD-10-CM

## 2021-12-05 DIAGNOSIS — Z5112 Encounter for antineoplastic immunotherapy: Secondary | ICD-10-CM | POA: Insufficient documentation

## 2021-12-05 DIAGNOSIS — Z5111 Encounter for antineoplastic chemotherapy: Secondary | ICD-10-CM | POA: Insufficient documentation

## 2021-12-05 DIAGNOSIS — Z95828 Presence of other vascular implants and grafts: Secondary | ICD-10-CM

## 2021-12-05 DIAGNOSIS — Z5181 Encounter for therapeutic drug level monitoring: Secondary | ICD-10-CM

## 2021-12-05 LAB — CMP (CANCER CENTER ONLY)
ALT: 14 U/L (ref 0–44)
AST: 17 U/L (ref 15–41)
Albumin: 3.9 g/dL (ref 3.5–5.0)
Alkaline Phosphatase: 95 U/L (ref 38–126)
Anion gap: 3 — ABNORMAL LOW (ref 5–15)
BUN: 14 mg/dL (ref 8–23)
CO2: 30 mmol/L (ref 22–32)
Calcium: 9.1 mg/dL (ref 8.9–10.3)
Chloride: 107 mmol/L (ref 98–111)
Creatinine: 0.56 mg/dL (ref 0.44–1.00)
GFR, Estimated: >60 mL/min (ref >60)
Glucose, Bld: 77 mg/dL (ref 70–99)
Potassium: 3.4 mmol/L — ABNORMAL LOW (ref 3.5–5.1)
Sodium: 140 mmol/L (ref 135–145)
Total Bilirubin: 0.3 mg/dL (ref 0.3–1.2)
Total Protein: 6.4 g/dL — ABNORMAL LOW (ref 6.5–8.1)

## 2021-12-05 LAB — CBC WITH DIFFERENTIAL (CANCER CENTER ONLY)
Abs Immature Granulocytes: 0.02 10*3/uL (ref 0.00–0.07)
Basophils Absolute: 0 10*3/uL (ref 0.0–0.1)
Basophils Relative: 0 %
Eosinophils Absolute: 0 10*3/uL (ref 0.0–0.5)
Eosinophils Relative: 0 %
HCT: 33.8 % — ABNORMAL LOW (ref 36.0–46.0)
Hemoglobin: 11.5 g/dL — ABNORMAL LOW (ref 12.0–15.0)
Immature Granulocytes: 0 %
Lymphocytes Relative: 33 %
Lymphs Abs: 2.8 10*3/uL (ref 0.7–4.0)
MCH: 31.6 pg (ref 26.0–34.0)
MCHC: 34 g/dL (ref 30.0–36.0)
MCV: 92.9 fL (ref 80.0–100.0)
Monocytes Absolute: 0.8 10*3/uL (ref 0.1–1.0)
Monocytes Relative: 9 %
Neutro Abs: 5 10*3/uL (ref 1.7–7.7)
Neutrophils Relative %: 58 %
Platelet Count: 188 10*3/uL (ref 150–400)
RBC: 3.64 MIL/uL — ABNORMAL LOW (ref 3.87–5.11)
RDW: 15.6 % — ABNORMAL HIGH (ref 11.5–15.5)
WBC Count: 8.6 10*3/uL (ref 4.0–10.5)
nRBC: 0 % (ref 0.0–0.2)

## 2021-12-05 MED ORDER — SODIUM CHLORIDE 0.9% FLUSH
10.0000 mL | Freq: Once | INTRAVENOUS | Status: AC
  Administered 2021-12-05: 10 mL

## 2021-12-05 MED ORDER — SODIUM CHLORIDE 0.9% FLUSH
10.0000 mL | INTRAVENOUS | Status: DC | PRN
  Administered 2021-12-05: 10 mL

## 2021-12-05 MED ORDER — TRASTUZUMAB-ANNS CHEMO 150 MG IV SOLR
6.0000 mg/kg | Freq: Once | INTRAVENOUS | Status: AC
  Administered 2021-12-05: 420 mg via INTRAVENOUS
  Filled 2021-12-05: qty 20

## 2021-12-05 MED ORDER — SODIUM CHLORIDE 0.9 % IV SOLN
10.0000 mg | Freq: Once | INTRAVENOUS | Status: AC
  Administered 2021-12-05: 10 mg via INTRAVENOUS
  Filled 2021-12-05: qty 10

## 2021-12-05 MED ORDER — PEGFILGRASTIM 6 MG/0.6ML ~~LOC~~ PSKT
6.0000 mg | PREFILLED_SYRINGE | Freq: Once | SUBCUTANEOUS | Status: AC
  Administered 2021-12-05: 6 mg via SUBCUTANEOUS
  Filled 2021-12-05: qty 0.6

## 2021-12-05 MED ORDER — ACETAMINOPHEN 325 MG PO TABS
650.0000 mg | ORAL_TABLET | Freq: Once | ORAL | Status: AC
  Administered 2021-12-05: 650 mg via ORAL
  Filled 2021-12-05: qty 2

## 2021-12-05 MED ORDER — SODIUM CHLORIDE 0.9 % IV SOLN
420.0000 mg | Freq: Once | INTRAVENOUS | Status: AC
  Administered 2021-12-05: 420 mg via INTRAVENOUS
  Filled 2021-12-05: qty 14

## 2021-12-05 MED ORDER — SODIUM CHLORIDE 0.9 % IV SOLN: Freq: Once | INTRAVENOUS | Status: AC

## 2021-12-05 MED ORDER — SODIUM CHLORIDE 0.9 % IV SOLN
350.0000 mg | Freq: Once | INTRAVENOUS | Status: AC
  Administered 2021-12-05: 350 mg via INTRAVENOUS
  Filled 2021-12-05: qty 35

## 2021-12-05 MED ORDER — SODIUM CHLORIDE 0.9 % IV SOLN
50.0000 mg/m2 | Freq: Once | INTRAVENOUS | Status: AC
  Administered 2021-12-05: 90 mg via INTRAVENOUS
  Filled 2021-12-05: qty 9

## 2021-12-05 MED ORDER — SODIUM CHLORIDE 0.9 % IV SOLN
150.0000 mg | Freq: Once | INTRAVENOUS | Status: AC
  Administered 2021-12-05: 150 mg via INTRAVENOUS
  Filled 2021-12-05: qty 150

## 2021-12-05 MED ORDER — HEPARIN SOD (PORK) LOCK FLUSH 100 UNIT/ML IV SOLN
500.0000 [IU] | Freq: Once | INTRAVENOUS | Status: AC | PRN
  Administered 2021-12-05: 500 [IU]

## 2021-12-05 MED ORDER — DIPHENHYDRAMINE HCL 25 MG PO CAPS
50.0000 mg | ORAL_CAPSULE | Freq: Once | ORAL | Status: AC
  Administered 2021-12-05: 50 mg via ORAL
  Filled 2021-12-05: qty 2

## 2021-12-05 MED ORDER — PALONOSETRON HCL INJECTION 0.25 MG/5ML
0.2500 mg | Freq: Once | INTRAVENOUS | Status: AC
  Administered 2021-12-05: 0.25 mg via INTRAVENOUS
  Filled 2021-12-05: qty 5

## 2021-12-05 NOTE — Progress Notes (Signed)
Per MD okay to treat today with echo from 08/19/21

## 2021-12-05 NOTE — Patient Instructions (Addendum)
[facility]  Discharge Instructions: Thank you for choosing [facility] to provide your oncology and hematology care.   If you have a lab appointment with the [facility], please go directly to the [facility] and check in at the registration area.   Wear comfortable clothing and clothing appropriate for easy access to any Portacath or PICC line.   We strive to give you quality time with your provider. You may need to reschedule your appointment if you arrive late (15 or more minutes).  Arriving late affects you and other patients whose appointments are after yours.  Also, if you miss three or more appointments without notifying the office, you may be dismissed from the clinic at the provider's discretion.      For prescription refill requests, have your pharmacy contact our office and allow 72 hours for refills to be completed.    Today you received the following chemotherapy and/or immunotherapy agents: Trastuzumab (Kanjinti), Pertuzumab (Perjeta), Docetaxel (Taxotere), and Carboplatin.   To help prevent nausea and vomiting after your treatment, we encourage you to take your nausea medication as directed.  BELOW ARE SYMPTOMS THAT SHOULD BE REPORTED IMMEDIATELY: *FEVER GREATER THAN 100.4 F (38 C) OR HIGHER *CHILLS OR SWEATING *NAUSEA AND VOMITING THAT IS NOT CONTROLLED WITH YOUR NAUSEA MEDICATION *UNUSUAL SHORTNESS OF BREATH *UNUSUAL BRUISING OR BLEEDING *URINARY PROBLEMS (pain or burning when urinating, or frequent urination) *BOWEL PROBLEMS (unusual diarrhea, constipation, pain near the anus) TENDERNESS IN MOUTH AND THROAT WITH OR WITHOUT PRESENCE OF ULCERS (sore throat, sores in mouth, or a toothache) UNUSUAL RASH, SWELLING OR PAIN  UNUSUAL VAGINAL DISCHARGE OR ITCHING   Items with * indicate a potential emergency and should be followed up as soon as possible or go to the Emergency Department if any problems should occur.  Please show  the CHEMOTHERAPY ALERT CARD or IMMUNOTHERAPY ALERT CARD at check-in to the Emergency Department and triage nurse.  Should you have questions after your visit or need to cancel or reschedule your appointment, please contact [facility]  Dept: [phone]  and follow the prompts.  Office hours are 8:00 a.m. to 4:30 p.m. Monday - Friday. Please note that voicemails left after 4:00 p.m. may not be returned until the following business day.  We are closed weekends and major holidays. You have access to a nurse at all times for urgent questions. Please call the main number to the clinic Dept: [phone] and follow the prompts.   For any non-urgent questions, you may also contact your provider using MyChart. We now offer e-Visits for anyone 60 and older to request care online for non-urgent symptoms. For details visit [email or web address].   Also download the MyChart app! Go to the app store, search "MyChart", open the app, select [facility], and log in with your MyChart username and password.  Masks are optional in the cancer centers. If you would like for your care team to wear a mask while they are taking care of you, please let them know. You may have one support person who is at least 68 years old accompany you for your appointments. Hypokalemia Hypokalemia means that the amount of potassium in the blood is lower than normal. Potassium is a mineral (electrolyte) that helps regulate the amount of fluid in the body. It also stimulates muscle tightening (contraction) and helps nerves work properly. Normally, most of the body's potassium is inside cells, and only a very small amount is in  the blood. Because the amount in the blood is so small, minor changes to potassium levels in the blood can be life-threatening. What are the causes? This condition may be caused by: Antibiotic medicine. Diarrhea or vomiting. Taking too much of a medicine that helps you have a bowel  movement (laxative) can cause diarrhea and lead to hypokalemia. Chronic kidney disease (CKD). Medicines that help the body get rid of excess fluid (diuretics). Eating disorders, such as anorexia or bulimia. Low magnesium levels in the body. Sweating a lot. What are the signs or symptoms? Symptoms of this condition include: Weakness. Constipation. Fatigue. Muscle cramps. Mental confusion. Skipped heartbeats or irregular heartbeat (palpitations). Tingling or numbness. How is this diagnosed? This condition is diagnosed with a blood test. How is this treated? This condition may be treated by: Taking potassium supplements. Adjusting the medicines that you take. Eating more foods that contain a lot of potassium. If your potassium level is very low, you may need to get potassium through an IV and be monitored in the hospital. Follow these instructions at home: Eating and drinking  Eat a healthy diet. A healthy diet includes fresh fruits and vegetables, whole grains, healthy fats, and lean proteins. If told, eat more foods that contain a lot of potassium. These include: Nuts, such as peanuts and pistachios. Seeds, such as sunflower seeds and pumpkin seeds. Peas, lentils, and lima beans. Whole grain and bran cereals and breads. Fresh fruits and vegetables, such as apricots, avocado, bananas, cantaloupe, kiwi, oranges, tomatoes, asparagus, and potatoes. Juices, such as orange, tomato, and prune. Lean meats, including fish. Milk and milk products, such as yogurt. General instructions Take over-the-counter and prescription medicines only as told by your health care provider. This includes vitamins, natural food products, and supplements. Keep all follow-up visits. This is important. Contact a health care provider if: You have weakness that gets worse. You feel your heart pounding or racing. You vomit. You have diarrhea. You have diabetes and you have trouble keeping your blood sugar  in your target range. Get help right away if: You have chest pain. You have shortness of breath. You have vomiting or diarrhea that lasts for more than 2 days. You faint. These symptoms may be an emergency. Get help right away. Call 911. Do not wait to see if the symptoms will go away. Do not drive yourself to the hospital. Summary Hypokalemia means that the amount of potassium in the blood is lower than normal. This condition is diagnosed with a blood test. Hypokalemia may be treated by taking potassium supplements, adjusting the medicines that you take, or eating more foods that are high in potassium. If your potassium level is very low, you may need to get potassium through an IV and be monitored in the hospital. This information is not intended to replace advice given to you by your health care provider. Make sure you discuss any questions you have with your health care provider. Document Revised: 11/29/2020 Document Reviewed: 11/29/2020 Elsevier Patient Education  [place]. Pegfilgrastim Injection What is this medication? PEGFILGRASTIM (PEG fil gra stim) lowers the risk of infection in people who are receiving chemotherapy. It works by Building control surveyor make more white blood cells, which protects your body from infection. It may also be used to help people who have been exposed to high doses of radiation. This medicine may be used for other purposes; ask your health care provider or pharmacist if you have questions. COMMON BRAND NAME(S): [place] [employer], Neulasta, Nyvepria,  Stimufend, UDENYCA, Ziextenzo What should I tell my care team before I take this medication? They need to know if you have any of these conditions: Kidney disease Latex allergy Ongoing radiation therapy Sickle cell disease Skin reactions to acrylic adhesives (On-Body Injector only) An unusual or allergic reaction to pegfilgrastim, filgrastim, other medications, foods, dyes, or preservatives Pregnant  or trying to get pregnant Breast-feeding How should I use this medication? This medication is for injection under the skin. If you get this medication at home, you will be taught how to prepare and give the pre-filled syringe or how to use the On-body Injector. Refer to the patient Instructions for Use for detailed instructions. Use exactly as directed. Tell your care team immediately if you suspect that the On-body Injector may not have performed as intended or if you suspect the use of the On-body Injector resulted in a missed or partial dose. It is important that you put your used needles and syringes in a special sharps container. Do not put them in a trash can. If you do not have a sharps container, call your pharmacist or care team to get one. Talk to your care team about the use of this medication in children. While this medication may be prescribed for selected conditions, precautions do apply. Overdosage: If you think you have taken too much of this medicine contact a poison control center or emergency room at once. NOTE: This medicine is only for you. Do not share this medicine with others. What if I miss a dose? It is important not to miss your dose. Call your care team if you miss your dose. If you miss a dose due to an On-body Injector failure or leakage, a new dose should be administered as soon as possible using a single prefilled syringe for manual use. What may interact with this medication? Interactions have not been studied. This list may not describe all possible interactions. Give your health care provider a list of all the medicines, herbs, non-prescription drugs, or dietary supplements you use. Also tell them if you smoke, drink alcohol, or use illegal drugs. Some items may interact with your medicine. What should I watch for while using this medication? Your condition will be monitored carefully while you are receiving this medication. You may need blood work done while you are  taking this medication. Talk to your care team about your risk of cancer. You may be more at risk for certain types of cancer if you take this medication. If you are going to need a MRI, CT scan, or other procedure, tell your care team that you are using this medication (On-Body Injector only). What side effects may I notice from receiving this medication? Side effects that you should report to your care team as soon as possible: Allergic reactions--skin rash, itching, hives, swelling of the face, lips, tongue, or throat Capillary leak syndrome--stomach or muscle pain, unusual weakness or fatigue, feeling faint or lightheaded, decrease in the amount of urine, swelling of the ankles, hands, or feet, trouble breathing High white blood cell level--fever, fatigue, trouble breathing, night sweats, change in vision, weight loss Inflammation of the aorta--fever, fatigue, back, chest, or stomach pain, severe headache Kidney injury (glomerulonephritis)--decrease in the amount of urine, red or dark brown urine, foamy or bubbly urine, swelling of the ankles, hands, or feet Shortness of breath or trouble breathing Spleen injury--pain in upper left stomach or shoulder Unusual bruising or bleeding Side effects that usually do not require medical attention (report to  your care team if they continue or are bothersome): Bone pain Pain in the hands or feet This list may not describe all possible side effects. Call your doctor for medical advice about side effects. You may report side effects to FDA at 1-800-FDA-1088. Where should I keep my medication? Keep out of the reach of children. If you are using this medication at home, you will be instructed on how to store it. Throw away any unused medication after the expiration date on the label. NOTE: This sheet is a summary. It may not cover all possible information. If you have questions about this medicine, talk to your doctor, pharmacist, or health care  provider.  2023 Elsevier/Gold Standard (2013-06-17 00:00:00)

## 2021-12-05 NOTE — Progress Notes (Signed)
Per Dr. Lindi Adie, ok to use ECHO from 08/19/21 for treatment today.

## 2021-12-05 NOTE — Assessment & Plan Note (Signed)
Malignant neoplasm of upper-outer quadrant of left breast, estrogen receptor positive ([place]) 07/01/2021:Screening mammogram 06/27/2021 detected abnormality in the left breast which led to diagnostic mammogram and left breast ultrasound that revealed hypoechoic mass with irregular borders 2:30 position 2 cm left axilla: 1 abnormal lymph node, grade 2 IDC with DCIS ER 99%, PR 95%, HER2 2+ by IHC, FISHpositive, Ki-67 20%, left axilla lymph node: Positive  Treatment plan: 1. Neoadjuvant chemotherapy withTCHP x6 followed by HP maintenance versus Kadcyla maintenance 2. Followed by breast conserving surgery with targeted axillary dissection 3. Followed by adjuvant radiation therapy 4.Followed by adjuvant antiestrogen therapy -------------------------------------------------------------------------------------------------------------------------------------- Current treatment:Cycle6TCHP Echocardiogram 08/03/2021: EF 65 to 70%  Chemotoxicities: 1. Mild nausea 2. Mild loose stool: sent prescription for Lomotil 3. Mild fatigue 4. Chemo induced anemia: monitoring closely 5. Alopecia  Breast MRI has been ordered.  She has an appointment to see Dr. Brantley Stage for surgery discussion. She will come back in 3 weeks for Herceptin Perjeta maintenance. Adjuvant treatment will be based upon pathologic results.

## 2021-12-09 ENCOUNTER — Ambulatory Visit
Admission: RE | Admit: 2021-12-09 | Discharge: 2021-12-09 | Disposition: A | Discharge status: Home / Self Care | Payer: Medicare Other | Source: Ambulatory Visit | Attending: Hematology and Oncology | Admitting: Hematology and Oncology

## 2021-12-09 DIAGNOSIS — Z170 Estrogen receptor positive status [ER+]: Secondary | ICD-10-CM

## 2021-12-09 MED ORDER — GADOBUTROL 1 MMOL/ML IV SOLN
7.0000 mL | Freq: Once | INTRAVENOUS | Status: AC | PRN
Start: 2021-12-09 — End: 2021-12-09
  Administered 2021-12-09: 7 mL via INTRAVENOUS

## 2021-12-10 ENCOUNTER — Encounter: Payer: Self-pay | Admitting: *Deleted

## 2021-12-13 ENCOUNTER — Telehealth: Payer: Self-pay

## 2021-12-13 NOTE — Telephone Encounter (Signed)
Pt called and asked about MRI results. Reviewed results with pt to show improvement with tx. Pt states she is unaware of past aortic aneurysm. Will review with MD to decide if cardiology consult is necessary or if he would like to order CTA. Pt is aware we will be back in touch to discuss.

## 2021-12-16 ENCOUNTER — Other Ambulatory Visit: Payer: Self-pay

## 2021-12-16 ENCOUNTER — Ambulatory Visit: Payer: Self-pay | Admitting: Surgery

## 2021-12-16 ENCOUNTER — Encounter: Payer: Self-pay | Admitting: Hematology and Oncology

## 2021-12-16 DIAGNOSIS — C50412 Malignant neoplasm of upper-outer quadrant of left female breast: Secondary | ICD-10-CM

## 2021-12-16 DIAGNOSIS — C50912 Malignant neoplasm of unspecified site of left female breast: Secondary | ICD-10-CM

## 2021-12-16 DIAGNOSIS — Z79899 Other long term (current) drug therapy: Secondary | ICD-10-CM

## 2021-12-16 DIAGNOSIS — D242 Benign neoplasm of left breast: Secondary | ICD-10-CM

## 2021-12-16 NOTE — Progress Notes (Signed)
Pt aware MD recommends consult with cardiothoracic surgery. Pt requested Dr Wayna Chalet at [facility] but Dr Renard Matter does not see thoracic. Pt is aware and is OK with seeing MD at [facility]. Referral faxed to [phone]. Fax confirmation received.

## 2021-12-17 ENCOUNTER — Ambulatory Visit (HOSPITAL_COMMUNITY)
Admission: RE | Admit: 2021-12-17 | Discharge: 2021-12-17 | Disposition: A | Discharge status: Home / Self Care | Payer: Medicare Other | Source: Ambulatory Visit | Attending: Hematology and Oncology | Admitting: Hematology and Oncology

## 2021-12-17 ENCOUNTER — Encounter: Payer: Self-pay | Admitting: *Deleted

## 2021-12-17 ENCOUNTER — Other Ambulatory Visit: Payer: Self-pay | Admitting: Surgery

## 2021-12-17 DIAGNOSIS — Z79899 Other long term (current) drug therapy: Secondary | ICD-10-CM | POA: Insufficient documentation

## 2021-12-17 DIAGNOSIS — I340 Nonrheumatic mitral (valve) insufficiency: Secondary | ICD-10-CM | POA: Insufficient documentation

## 2021-12-17 DIAGNOSIS — Z0189 Encounter for other specified special examinations: Secondary | ICD-10-CM

## 2021-12-17 DIAGNOSIS — C50919 Malignant neoplasm of unspecified site of unspecified female breast: Secondary | ICD-10-CM | POA: Diagnosis present

## 2021-12-17 DIAGNOSIS — C50912 Malignant neoplasm of unspecified site of left female breast: Secondary | ICD-10-CM

## 2021-12-17 DIAGNOSIS — Z5181 Encounter for therapeutic drug level monitoring: Secondary | ICD-10-CM | POA: Insufficient documentation

## 2021-12-17 DIAGNOSIS — I5030 Unspecified diastolic (congestive) heart failure: Secondary | ICD-10-CM | POA: Diagnosis not present

## 2021-12-17 LAB — ECHOCARDIOGRAM COMPLETE
AR max vel: 2.66 cm2
AV Area VTI: 2.5 cm2
AV Area mean vel: 2.55 cm2
AV Mean grad: 4 mmHg
AV Peak grad: 6.5 mmHg
Ao pk vel: 1.27 m/s
Area-P 1/2: 4.06 cm2
Calc EF: 55.6 %
MV M vel: 2.29 m/s
MV Peak grad: 20.9 mmHg
S' Lateral: 2.6 cm
Single Plane A2C EF: 54.5 %
Single Plane A4C EF: 56.4 %

## 2021-12-18 ENCOUNTER — Encounter: Payer: Self-pay | Admitting: *Deleted

## 2021-12-18 DIAGNOSIS — Z170 Estrogen receptor positive status [ER+]: Secondary | ICD-10-CM

## 2021-12-19 ENCOUNTER — Telehealth: Payer: Self-pay

## 2021-12-19 ENCOUNTER — Other Ambulatory Visit: Payer: Self-pay

## 2021-12-19 DIAGNOSIS — Z170 Estrogen receptor positive status [ER+]: Secondary | ICD-10-CM

## 2021-12-19 DIAGNOSIS — I7121 Aneurysm of the ascending aorta, without rupture: Secondary | ICD-10-CM

## 2021-12-19 NOTE — Telephone Encounter (Signed)
S/w pt in regards to CTA needed for cardiology. Pt is going to call central scheduling to make appt before she has surgery. PA not required per our PA team.

## 2021-12-19 NOTE — Telephone Encounter (Signed)
Attempted to call pt to advise cardiothoracic surgeon has requested a CTA before new consult. Orders placed per MD and message sent to PA team to indicate whether a PA was needed. Once PA obtained we will schedule CTA based on pt's availability. LVM for call back so we can give these instructions.

## 2021-12-24 ENCOUNTER — Other Ambulatory Visit: Payer: Self-pay | Admitting: *Deleted

## 2021-12-24 DIAGNOSIS — C50412 Malignant neoplasm of upper-outer quadrant of left female breast: Secondary | ICD-10-CM

## 2021-12-25 ENCOUNTER — Other Ambulatory Visit: Payer: Self-pay

## 2021-12-25 ENCOUNTER — Encounter (HOSPITAL_BASED_OUTPATIENT_CLINIC_OR_DEPARTMENT_OTHER): Payer: Self-pay | Admitting: Surgery

## 2021-12-25 NOTE — Progress Notes (Signed)
   12/25/21 1230  PAT Phone Screen  Do You Have Diabetes? No  Do You Have Hypertension? (S)  Yes (stopped amlodipine >21moago, BP had been normal)  Have You Ever Been to the ER for Asthma? No  Have You Taken Oral Steroids in the Past 3 Months? No  Do you Take Phenteramine or any Other Diet Drugs? No  Recent  Lab Work, EKG, CXR? Yes  Where was this test performed? 12-05-21 CBC/diff, CMP at [facility] Do you have a history of heart problems? No  Have You Ever Had Tests on Your Heart? Yes  Where? ECHO for chemo EF 60-65%, 12-27-21 CT angio chest for aneurysm  Any Recent Hospitalizations? No  Height 5' 5.5" (1.664 m)  Weight 68.9 kg  Pat Appointment Scheduled (S)  Yes (coming in for PAT)

## 2021-12-26 ENCOUNTER — Inpatient Hospital Stay (HOSPITAL_BASED_OUTPATIENT_CLINIC_OR_DEPARTMENT_OTHER): Payer: Medicare Other | Admitting: Adult Health

## 2021-12-26 ENCOUNTER — Other Ambulatory Visit: Payer: Self-pay

## 2021-12-26 ENCOUNTER — Encounter: Payer: Self-pay | Admitting: Adult Health

## 2021-12-26 ENCOUNTER — Inpatient Hospital Stay: Payer: Medicare Other

## 2021-12-26 DIAGNOSIS — Z95828 Presence of other vascular implants and grafts: Secondary | ICD-10-CM

## 2021-12-26 DIAGNOSIS — Z170 Estrogen receptor positive status [ER+]: Secondary | ICD-10-CM

## 2021-12-26 DIAGNOSIS — C50412 Malignant neoplasm of upper-outer quadrant of left female breast: Secondary | ICD-10-CM | POA: Diagnosis not present

## 2021-12-26 DIAGNOSIS — Z5112 Encounter for antineoplastic immunotherapy: Secondary | ICD-10-CM | POA: Diagnosis not present

## 2021-12-26 LAB — CMP (CANCER CENTER ONLY)
ALT: 13 U/L (ref 0–44)
AST: 21 U/L (ref 15–41)
Albumin: 4 g/dL (ref 3.5–5.0)
Alkaline Phosphatase: 111 U/L (ref 38–126)
Anion gap: 6 (ref 5–15)
BUN: 14 mg/dL (ref 8–23)
CO2: 31 mmol/L (ref 22–32)
Calcium: 8.9 mg/dL (ref 8.9–10.3)
Chloride: 103 mmol/L (ref 98–111)
Creatinine: 0.55 mg/dL (ref 0.44–1.00)
GFR, Estimated: >60 mL/min (ref >60)
Glucose, Bld: 86 mg/dL (ref 70–99)
Potassium: 4.2 mmol/L (ref 3.5–5.1)
Sodium: 140 mmol/L (ref 135–145)
Total Bilirubin: 0.3 mg/dL (ref 0.3–1.2)
Total Protein: 6.7 g/dL (ref 6.5–8.1)

## 2021-12-26 LAB — CBC WITH DIFFERENTIAL (CANCER CENTER ONLY)
Abs Immature Granulocytes: 0.01 10*3/uL (ref 0.00–0.07)
Basophils Absolute: 0 10*3/uL (ref 0.0–0.1)
Basophils Relative: 0 %
Eosinophils Absolute: 0 10*3/uL (ref 0.0–0.5)
Eosinophils Relative: 0 %
HCT: 35.5 % — ABNORMAL LOW (ref 36.0–46.0)
Hemoglobin: 12 g/dL (ref 12.0–15.0)
Immature Granulocytes: 0 %
Lymphocytes Relative: 27 %
Lymphs Abs: 1.5 10*3/uL (ref 0.7–4.0)
MCH: 31.7 pg (ref 26.0–34.0)
MCHC: 33.8 g/dL (ref 30.0–36.0)
MCV: 93.7 fL (ref 80.0–100.0)
Monocytes Absolute: 0.4 10*3/uL (ref 0.1–1.0)
Monocytes Relative: 8 %
Neutro Abs: 3.8 10*3/uL (ref 1.7–7.7)
Neutrophils Relative %: 65 %
Platelet Count: 186 10*3/uL (ref 150–400)
RBC: 3.79 MIL/uL — ABNORMAL LOW (ref 3.87–5.11)
RDW: 14.9 % (ref 11.5–15.5)
WBC Count: 5.8 10*3/uL (ref 4.0–10.5)
nRBC: 0 % (ref 0.0–0.2)

## 2021-12-26 MED ORDER — DIPHENHYDRAMINE HCL 25 MG PO CAPS
50.0000 mg | ORAL_CAPSULE | Freq: Once | ORAL | Status: AC
  Administered 2021-12-26: 50 mg via ORAL
  Filled 2021-12-26: qty 2

## 2021-12-26 MED ORDER — SODIUM CHLORIDE 0.9% FLUSH
10.0000 mL | Freq: Once | INTRAVENOUS | Status: AC
  Administered 2021-12-26: 10 mL

## 2021-12-26 MED ORDER — SODIUM CHLORIDE 0.9 % IV SOLN
420.0000 mg | Freq: Once | INTRAVENOUS | Status: AC
  Administered 2021-12-26: 420 mg via INTRAVENOUS
  Filled 2021-12-26: qty 14

## 2021-12-26 MED ORDER — SODIUM CHLORIDE 0.9 % IV SOLN: Freq: Once | INTRAVENOUS | Status: AC

## 2021-12-26 MED ORDER — TRASTUZUMAB-ANNS CHEMO 150 MG IV SOLR
6.0000 mg/kg | Freq: Once | INTRAVENOUS | Status: AC
  Administered 2021-12-26: 420 mg via INTRAVENOUS
  Filled 2021-12-26: qty 20

## 2021-12-26 MED ORDER — ACETAMINOPHEN 325 MG PO TABS
650.0000 mg | ORAL_TABLET | Freq: Once | ORAL | Status: AC
  Administered 2021-12-26: 650 mg via ORAL
  Filled 2021-12-26: qty 2

## 2021-12-26 MED ORDER — SODIUM CHLORIDE 0.9% FLUSH
10.0000 mL | INTRAVENOUS | Status: DC | PRN
  Administered 2021-12-26: 10 mL

## 2021-12-26 MED ORDER — HEPARIN SOD (PORK) LOCK FLUSH 100 UNIT/ML IV SOLN
500.0000 [IU] | Freq: Once | INTRAVENOUS | Status: AC | PRN
  Administered 2021-12-26: 500 [IU]

## 2021-12-26 NOTE — Assessment & Plan Note (Signed)
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  She is status post neoadjuvant chemotherapy with TCHP and awaits lumpectomy which is scheduled for next week.  She continues on Herceptin Perjeta with good tolerance and is due for a dose today.  Her most recent echocardiogram was completed 2 weeks ago and was stable.  We discussed her MRI results and how there appeared to be a small residual amount of cancer.  We have to wait until final pathology results but I did go ahead and give her some preliminary information on Kadcyla which she may need once her surgery is completed.  We reviewed that regardless she will need echoes every 3 months.  Jaleena will return on October 19 for labs, follow-up with Dr. Lindi Adie, and her infusion.  He knows to call anytime between now and then for any questions or concerns that may arise as we can always see her sooner if needed.

## 2021-12-26 NOTE — Progress Notes (Signed)
[facility] Follow Mccormick:    Amber Devoid, PA-C [address] [place] [address]   DIAGNOSIS:  Cancer Staging  Malignant neoplasm of upper-outer quadrant of left breast, estrogen receptor positive (Amber Mccormick) Staging form: Breast, AJCC 8th Edition - Clinical stage from 08/08/2021: Stage IB (cT1c, cN1(f), cM0, G2, ER+, PR+, HER2+) - Signed by Nicholas Lose, MD on 08/08/2021 Stage prefix: Initial diagnosis Method of lymph node assessment: Core biopsy Histologic grading system: 3 grade system - Clinical: No stage assigned - Unsigned   SUMMARY OF ONCOLOGIC HISTORY: Oncology History  Malignant neoplasm of upper-outer quadrant of left breast, estrogen receptor positive ([name])  07/01/2021 Initial Diagnosis   Screening mammogram 06/27/2021 detected abnormality in the left breast which led to diagnostic mammogram and left breast ultrasound that revealed hypoechoic mass with irregular borders 2:30 position 2 cm left axilla: 1 abnormal lymph node, grade 2 IDC with DCIS ER 99%, PR 95%, HER2 2+ by IHC, FISH positive, Ki-67 20%, left axilla lymph node: Positive   08/08/2021 Cancer Staging   Staging form: Breast, AJCC 8th Edition - Clinical stage from 08/08/2021: Stage IB (cT1c, cN1(f), cM0, G2, ER+, PR+, HER2+) - Signed by Nicholas Lose, MD on 08/08/2021 Stage prefix: Initial diagnosis Method of lymph node assessment: Core biopsy Histologic grading system: 3 grade system   08/14/2021 -  Chemotherapy   Patient is on Treatment Plan : BREAST  Docetaxel + Carboplatin + Trastuzumab + Pertuzumab  (TCHP) q21d / Trastuzumab + Pertuzumab q21d     08/22/2021 - 11/14/2021 Chemotherapy   Patient is on Treatment Plan : BREAST  Docetaxel + Carboplatin + Trastuzumab + Pertuzumab  (TCHP) q21d        CURRENT THERAPY: TCHP  INTERVAL HISTORY: Amber Mccormick 66 y.o. female returns for follow Mccormick prior to receiving Herceptin and Perjeta.   She completed the neoadjuvant portion of  her chemotherapy on December 05, 2021.  Her post neoadjuvant treatment MRI was completed on December 09, 2021 which showed treatment response with decreased size of known outer left breast malignancy now measuring 1.6 x 1.1 x 1.4 cm which was previously 2.2 x 2.1 x 2.2 cm and the left axillary lymph node that was positive now is normal in appearance.  She did have an unchanged 4 cm a sending thoracic aortic aneurysm and CTA or MRI was recommended based on the 2010 a [facility]/AHA/AA TS/ACR/ASA/SCA/[place] AI/SIR/STS/SVM guidelines.  She is scheduled for CTA tomorrow and will see thoracic surgery in October.  Amber Mccormick is scheduled for surgery with left breast lumpectomy with Dr. Brantley Stage on January 01, 2022.  Her most recent echocardiogram was completed on December 17, 2021 and showed a preserved ejection fraction of 60 to 65% and a normal global longitudinal strain.   Patient Active Problem List   Diagnosis Date Noted   Port-A-Cath in place 08/22/2021   Malignant neoplasm of upper-outer quadrant of left breast, estrogen receptor positive ([place]) 08/08/2021   Mixed hyperlipidemia 05/21/2020   Anxiety 05/21/2020   Acquired hypothyroidism 05/21/2020    has No Known Allergies.  MEDICAL HISTORY: Past Medical History:  Diagnosis Date   Anxiety    Cancer ([place]) 05/2021   right breast IDC, + lymph node   Hyperlipidemia    Hypertension    no meds now, BP has been good   Hypothyroidism     SURGICAL HISTORY: Past Surgical History:  Procedure Laterality Date   MOHS SURGERY     on nose   PORTACATH PLACEMENT  Right 08/21/2021   Procedure: INSERTION PORT-A-CATH;  Surgeon: Erroll Luna, MD;  Location: [facility];  Service: General;  Laterality: Right;   TUBAL LIGATION      SOCIAL HISTORY: Social History   Socioeconomic History   Marital status: Married    Spouse name: Not on file   Number of children: Not on file   Years of education: Not on file   Highest education level: Not on file   Occupational History   Not on file  Tobacco Use   Smoking status: Never   Smokeless tobacco: Never  Substance and Sexual Activity   Alcohol use: Never   Drug use: Never   Sexual activity: Yes    Birth control/protection: Post-menopausal  Other Topics Concern   Not on file  Social History Narrative   Not on file   Social Determinants of Health   Financial Resource Strain: Not on file  Food Insecurity: Not on file  Transportation Needs: Not on file  Physical Activity: Not on file  Stress: Not on file  Social Connections: Not on file  Intimate Partner Violence: Not on file    FAMILY HISTORY: Family History  Problem Relation Age of Onset   Pancreatic cancer Father    Bone cancer Maternal Uncle     Review of Systems  Constitutional:  Negative for appetite change, chills, fatigue, fever and unexpected weight change.  HENT:   Negative for hearing loss, lump/mass and trouble swallowing.   Eyes:  Negative for eye problems and icterus.  Respiratory:  Negative for chest tightness, cough and shortness of breath.   Cardiovascular:  Negative for chest pain, leg swelling and palpitations.  Gastrointestinal:  Negative for abdominal distention, abdominal pain, constipation, diarrhea, nausea and vomiting.  Endocrine: Negative for hot flashes.  Genitourinary:  Negative for difficulty urinating.   Musculoskeletal:  Negative for arthralgias.  Skin:  Negative for itching and rash.  Neurological:  Negative for dizziness, extremity weakness, headaches and numbness.  Hematological:  Negative for adenopathy. Does not bruise/bleed easily.  Psychiatric/Behavioral:  Negative for depression. The patient is not nervous/anxious.       PHYSICAL EXAMINATION  ECOG PERFORMANCE STATUS: 1 - Symptomatic but completely ambulatory  Vitals:   12/26/21 1124  BP: 133/83  Pulse: 87  Resp: 18  Temp: 99.2 F (37.3 C)  SpO2: 99%    Physical Exam Constitutional:      General: She is not in acute  distress.    Appearance: Normal appearance. She is not toxic-appearing.  HENT:     Head: Normocephalic and atraumatic.  Eyes:     General: No scleral icterus. Cardiovascular:     Rate and Rhythm: Normal rate and regular rhythm.     Pulses: Normal pulses.     Heart sounds: Normal heart sounds.  Pulmonary:     Effort: Pulmonary effort is normal.     Breath sounds: Normal breath sounds.  Abdominal:     General: Abdomen is flat. Bowel sounds are normal. There is no distension.     Palpations: Abdomen is soft.     Tenderness: There is no abdominal tenderness.  Musculoskeletal:        General: No swelling.     Cervical back: Neck supple.  Lymphadenopathy:     Cervical: No cervical adenopathy.  Skin:    General: Skin is warm and dry.     Findings: No rash.  Neurological:     General: No focal deficit present.     Mental  Status: She is alert.  Psychiatric:        Mood and Affect: Mood normal.        Behavior: Behavior normal.     LABORATORY DATA:  CBC    Component Value Date/Time   WBC 5.8 12/26/2021 1044   RBC 3.79 (L) 12/26/2021 1044   HGB 12.0 12/26/2021 1044   HCT 35.5 (L) 12/26/2021 1044   PLT 186 12/26/2021 1044   MCV 93.7 12/26/2021 1044   MCH 31.7 12/26/2021 1044   MCHC 33.8 12/26/2021 1044   RDW 14.9 12/26/2021 1044   LYMPHSABS 1.5 12/26/2021 1044   MONOABS 0.4 12/26/2021 1044   EOSABS 0.0 12/26/2021 1044   BASOSABS 0.0 12/26/2021 1044    CMP     Component Value Date/Time   NA 140 12/26/2021 1044   K 4.2 12/26/2021 1044   CL 103 12/26/2021 1044   CO2 31 12/26/2021 1044   GLUCOSE 86 12/26/2021 1044   BUN 14 12/26/2021 1044   CREATININE 0.55 12/26/2021 1044   CALCIUM 8.9 12/26/2021 1044   PROT 6.7 12/26/2021 1044   ALBUMIN 4.0 12/26/2021 1044   AST 21 12/26/2021 1044   ALT 13 12/26/2021 1044   ALKPHOS 111 12/26/2021 1044   BILITOT 0.3 12/26/2021 1044   GFRNONAA >60 12/26/2021 1044     ASSESSMENT and THERAPY PLAN:   Malignant neoplasm of  upper-outer quadrant of left breast, estrogen receptor positive ([facility]) Mellisa is here today for follow-Mccormick and evaluation of her 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  She is status post neoadjuvant chemotherapy with TCHP and awaits lumpectomy which is scheduled for next week.  She continues on Herceptin Perjeta with good tolerance and is due for a dose today.  Her most recent echocardiogram was completed 2 weeks ago and was stable.  We discussed her MRI results and how there appeared to be a small residual amount of cancer.  We have to wait until final pathology results but I did go ahead and give her some preliminary information on Kadcyla which she may need once her surgery is completed.  We reviewed that regardless she will need echoes every 3 months.  Sherel will return on October 19 for labs, follow-Mccormick with Dr. Lindi Adie, and her infusion.  He knows to call anytime between now and then for any questions or concerns that may arise as we can always see her sooner if needed.     All questions were answered. The patient knows to call the clinic with any problems, questions or concerns. We can certainly see the patient much sooner if necessary.  Total encounter time:20 minutes*in face-to-face visit time, chart review, lab review, care coordination, order entry, and documentation of the encounter time.    Wilber Bihari, NP 12/26/21 12:03 PM Medical Oncology and Hematology [facility] [place], [place] [address] Tel. [phone]    Fax. [phone]  *Total Encounter Time as defined by the Centers for Medicare and Medicaid Services includes, in addition to the face-to-face time of a patient visit (documented in the note above) non-face-to-face time: obtaining and reviewing outside history, ordering and reviewing medications, tests or procedures, care coordination (communications with other health care professionals or caregivers) and  documentation in the medical record.

## 2021-12-26 NOTE — Patient Instructions (Signed)
Ado-Trastuzumab Emtansine Injection What is this medication? ADO-TRASTUZUMAB EMTANSINE (ADD oh traz TOO zuh mab em TAN zine) treats breast cancer. It works by blocking a protein that causes cancer cells to grow and multiply. This helps to slow or stop the spread of cancer cells. This medicine may be used for other purposes; ask your health care provider or pharmacist if you have questions. COMMON BRAND NAME(S): Kadcyla What should I tell my care team before I take this medication? They need to know if you have any of these conditions: Heart failure Liver disease Low platelet levels Lung disease Tingling of the fingers or toes or other nerve disorder An unusual or allergic reaction to ado-trastuzumab emtansine, other medications, foods, dyes, or preservatives Pregnant or trying to get pregnant Breast-feeding How should I use this medication? This medication is infused into a vein. It is given by your care team in a hospital or clinic setting. Talk to your care team about the use of this medication in children. Special care may be needed. Overdosage: If you think you have taken too much of this medicine contact a poison control center or emergency room at once. NOTE: This medicine is only for you. Do not share this medicine with others. What if I miss a dose? Keep appointments for follow-up doses. It is important not to miss your dose. Call your care team if you are unable to keep an appointment. What may interact with this medication? Atazanavir Boceprevir Clarithromycin Dalfopristin; quinupristin Delavirdine Indinavir Isoniazid, INH Itraconazole Ketoconazole Nefazodone Nelfinavir Ritonavir Telaprevir Telithromycin Tipranavir Voriconazole This list may not describe all possible interactions. Give your health care provider a list of all the medicines, herbs, non-prescription drugs, or dietary supplements you use. Also tell them if you smoke, drink alcohol, or use illegal drugs.  Some items may interact with your medicine. What should I watch for while using this medication? This medication may make you feel generally unwell. This is not uncommon, as chemotherapy can affect healthy cells as well as cancer cells. Report any side effects. Continue your course of treatment even though you feel ill unless your care team tells you to stop. You may need blood work while taking this medication. This medication may increase your risk to bruise or bleed. Call your care team if you notice any unusual bleeding. Be careful brushing or flossing your teeth or using a toothpick because you may get an infection or bleed more easily. If you have any dental work done, tell your dentist you are receiving this medication. Talk to your care team if you may be pregnant. Serious birth defects can occur if you take this medication during pregnancy and for 7 months after the last dose. You will need a negative pregnancy test before starting this medication. Contraception is recommended while taking this medication and for 7 months after the last dose. Your care team can help you find the option that works for you. If your partner can get pregnant, use a condom during sex while taking this medication and for 4 months after the last dose. Do not breastfeed while taking this medication and for 7 months after the last dose. This medication may cause infertility. Talk to your care team if you are concerned with your fertility. What side effects may I notice from receiving this medication? Side effects that you should report to your care team as soon as possible: Allergic reactions--skin rash, itching, hives, swelling of the face, lips, tongue, or throat Bleeding--bloody or black, tar-like stools, vomiting   blood or brown material that looks like coffee grounds, red or dark brown urine, small red or purple spots on skin, unusual bruising or bleeding Dry cough, shortness of breath or trouble breathing Heart  failure--shortness of breath, swelling of the ankles, feet, or hands, sudden weight gain, unusual weakness or fatigue Infusion reactions--chest pain, shortness of breath or trouble breathing, feeling faint or lightheaded Liver injury--right upper belly pain, loss of appetite, nausea, light-colored stool, dark yellow or brown urine, yellowing skin or eyes, unusual weakness or fatigue Pain, tingling, or numbness in the hands or feet Painful swelling, warmth, or redness of the skin, blisters or sores at the infusion site Side effects that usually do not require medical attention (report to your care team if they continue or are bothersome): Constipation Fatigue Headache Muscle pain Nausea This list may not describe all possible side effects. Call your doctor for medical advice about side effects. You may report side effects to FDA at 1-800-FDA-1088. Where should I keep my medication? This medication is given in a hospital or clinic. It will not be stored at home. NOTE: This sheet is a summary. It may not cover all possible information. If you have questions about this medicine, talk to your doctor, pharmacist, or health care provider.  2023 Elsevier/Gold Standard (2021-08-02 00:00:00)  

## 2021-12-26 NOTE — Patient Instructions (Signed)
[facility]  Discharge Instructions: Thank you for choosing [facility] to provide your oncology and hematology care.   If you have a lab appointment with the [facility], please go directly to the [facility] and check in at the registration area.   Wear comfortable clothing and clothing appropriate for easy access to any Portacath or PICC line.   We strive to give you quality time with your provider. You may need to reschedule your appointment if you arrive late (15 or more minutes).  Arriving late affects you and other patients whose appointments are after yours.  Also, if you miss three or more appointments without notifying the office, you may be dismissed from the clinic at the provider's discretion.      For prescription refill requests, have your pharmacy contact our office and allow 72 hours for refills to be completed.    Today you received the following chemotherapy and/or immunotherapy agents: trastuzumab-anns, pertuzumab      To help prevent nausea and vomiting after your treatment, we encourage you to take your nausea medication as directed.  BELOW ARE SYMPTOMS THAT SHOULD BE REPORTED IMMEDIATELY: *FEVER GREATER THAN 100.4 F (38 C) OR HIGHER *CHILLS OR SWEATING *NAUSEA AND VOMITING THAT IS NOT CONTROLLED WITH YOUR NAUSEA MEDICATION *UNUSUAL SHORTNESS OF BREATH *UNUSUAL BRUISING OR BLEEDING *URINARY PROBLEMS (pain or burning when urinating, or frequent urination) *BOWEL PROBLEMS (unusual diarrhea, constipation, pain near the anus) TENDERNESS IN MOUTH AND THROAT WITH OR WITHOUT PRESENCE OF ULCERS (sore throat, sores in mouth, or a toothache) UNUSUAL RASH, SWELLING OR PAIN  UNUSUAL VAGINAL DISCHARGE OR ITCHING   Items with * indicate a potential emergency and should be followed up as soon as possible or go to the Emergency Department if any problems should occur.  Please show the CHEMOTHERAPY ALERT CARD or IMMUNOTHERAPY ALERT  CARD at check-in to the Emergency Department and triage nurse.  Should you have questions after your visit or need to cancel or reschedule your appointment, please contact [facility]  Dept: [phone]  and follow the prompts.  Office hours are 8:00 a.m. to 4:30 p.m. Monday - Friday. Please note that voicemails left after 4:00 p.m. may not be returned until the following business day.  We are closed weekends and major holidays. You have access to a nurse at all times for urgent questions. Please call the main number to the clinic Dept: [phone] and follow the prompts.   For any non-urgent questions, you may also contact your provider using MyChart. We now offer e-Visits for anyone 65 and older to request care online for non-urgent symptoms. For details visit [email or web address].   Also download the MyChart app! Go to the app store, search "MyChart", open the app, select [facility], and log in with your MyChart username and password.  Masks are optional in the cancer centers. If you would like for your care team to wear a mask while they are taking care of you, please let them know. You may have one support person who is at least 68 years old accompany you for your appointments.

## 2021-12-27 ENCOUNTER — Other Ambulatory Visit: Payer: Self-pay

## 2021-12-27 ENCOUNTER — Encounter (HOSPITAL_COMMUNITY): Payer: Self-pay

## 2021-12-27 ENCOUNTER — Ambulatory Visit (HOSPITAL_COMMUNITY)
Admission: RE | Admit: 2021-12-27 | Discharge: 2021-12-27 | Disposition: A | Discharge status: Home / Self Care | Payer: Medicare Other | Source: Ambulatory Visit | Attending: Hematology and Oncology | Admitting: Hematology and Oncology

## 2021-12-27 ENCOUNTER — Encounter (HOSPITAL_BASED_OUTPATIENT_CLINIC_OR_DEPARTMENT_OTHER)
Admission: RE | Admit: 2021-12-27 | Discharge: 2021-12-27 | Disposition: A | Discharge status: Home / Self Care | Payer: Medicare Other | Source: Ambulatory Visit | Attending: Surgery | Admitting: Surgery

## 2021-12-27 DIAGNOSIS — I10 Essential (primary) hypertension: Secondary | ICD-10-CM | POA: Insufficient documentation

## 2021-12-27 DIAGNOSIS — Z170 Estrogen receptor positive status [ER+]: Secondary | ICD-10-CM | POA: Insufficient documentation

## 2021-12-27 DIAGNOSIS — Z01810 Encounter for preprocedural cardiovascular examination: Secondary | ICD-10-CM | POA: Insufficient documentation

## 2021-12-27 DIAGNOSIS — I493 Ventricular premature depolarization: Secondary | ICD-10-CM | POA: Insufficient documentation

## 2021-12-27 DIAGNOSIS — C50412 Malignant neoplasm of upper-outer quadrant of left female breast: Secondary | ICD-10-CM | POA: Diagnosis present

## 2021-12-27 DIAGNOSIS — I7121 Aneurysm of the ascending aorta, without rupture: Secondary | ICD-10-CM | POA: Diagnosis present

## 2021-12-27 MED ORDER — SODIUM CHLORIDE (PF) 0.9 % IJ SOLN
INTRAMUSCULAR | Status: AC
  Filled 2021-12-27: qty 50

## 2021-12-27 MED ORDER — IOHEXOL 350 MG/ML SOLN
80.0000 mL | Freq: Once | INTRAVENOUS | Status: AC | PRN
  Administered 2021-12-27: 80 mL via INTRAVENOUS

## 2021-12-31 ENCOUNTER — Ambulatory Visit
Admission: RE | Admit: 2021-12-31 | Discharge: 2021-12-31 | Disposition: A | Discharge status: Home / Self Care | Payer: Medicare Other | Source: Ambulatory Visit | Attending: Surgery | Admitting: Surgery

## 2021-12-31 ENCOUNTER — Other Ambulatory Visit: Payer: Self-pay | Admitting: Surgery

## 2021-12-31 DIAGNOSIS — C50912 Malignant neoplasm of unspecified site of left female breast: Secondary | ICD-10-CM

## 2021-12-31 NOTE — Anesthesia Preprocedure Evaluation (Addendum)
Anesthesia Evaluation  Patient identified by MRN, date of birth, ID band Patient awake    Reviewed: Allergy & Precautions, NPO status , Patient's Chart, lab work & pertinent test results  Airway Mallampati: III  TM Distance: >3 FB Neck ROM: Full    Dental no notable dental hx. (+) Poor Dentition, Chipped, Dental Advisory Given, Caps   Pulmonary neg pulmonary ROS,    Pulmonary exam normal breath sounds clear to auscultation       Cardiovascular hypertension, Pt. on medications negative cardio ROS Normal cardiovascular exam Rhythm:Regular Rate:Normal  HLD   Neuro/Psych PSYCHIATRIC DISORDERS Anxiety negative neurological ROS  negative psych ROS   GI/Hepatic negative GI ROS, Neg liver ROS,   Endo/Other  negative endocrine ROSHypothyroidism   Renal/GU negative Renal ROS  negative genitourinary   Musculoskeletal negative musculoskeletal ROS (+)   Abdominal   Peds negative pediatric ROS (+)  Hematology negative hematology ROS (+)   Anesthesia Other Findings   Reproductive/Obstetrics negative OB ROS                           Anesthesia Physical  Anesthesia Plan  ASA: 3  Anesthesia Plan: General and Regional   Post-op Pain Management: Tylenol PO (pre-op)*, Celebrex PO (pre-op)* and Regional block*   Induction: Intravenous  PONV Risk Score and Plan: 3 and Midazolam, Ondansetron and Dexamethasone  Airway Management Planned: LMA and Oral ETT  Additional Equipment: None  Intra-op Plan:   Post-operative Plan: Extubation in OR  Informed Consent: I have reviewed the patients History and Physical, chart, labs and discussed the procedure including the risks, benefits and alternatives for the proposed anesthesia with the patient or authorized representative who has indicated his/her understanding and acceptance.     Dental advisory given  Plan Discussed with: Anesthesiologist and  CRNA  Anesthesia Plan Comments: (  )        Anesthesia Quick Evaluation

## 2022-01-01 ENCOUNTER — Ambulatory Visit
Admission: RE | Admit: 2022-01-01 | Discharge: 2022-01-01 | Disposition: A | Discharge status: Home / Self Care | Payer: Medicare Other | Source: Ambulatory Visit | Attending: Surgery | Admitting: Surgery

## 2022-01-01 ENCOUNTER — Ambulatory Visit (HOSPITAL_BASED_OUTPATIENT_CLINIC_OR_DEPARTMENT_OTHER): Payer: Medicare Other | Admitting: Anesthesiology

## 2022-01-01 ENCOUNTER — Other Ambulatory Visit: Payer: Self-pay

## 2022-01-01 ENCOUNTER — Encounter (HOSPITAL_BASED_OUTPATIENT_CLINIC_OR_DEPARTMENT_OTHER): Payer: Self-pay | Admitting: Surgery

## 2022-01-01 ENCOUNTER — Encounter (HOSPITAL_BASED_OUTPATIENT_CLINIC_OR_DEPARTMENT_OTHER)
Admission: RE | Disposition: A | Discharge status: Home / Self Care | Payer: Self-pay | Source: Home / Self Care | Attending: Surgery

## 2022-01-01 ENCOUNTER — Ambulatory Visit (HOSPITAL_BASED_OUTPATIENT_CLINIC_OR_DEPARTMENT_OTHER)
Admission: RE | Admit: 2022-01-01 | Discharge: 2022-01-01 | Disposition: A | Discharge status: Home / Self Care | Payer: Medicare Other | Attending: Surgery | Admitting: Surgery

## 2022-01-01 DIAGNOSIS — C50912 Malignant neoplasm of unspecified site of left female breast: Secondary | ICD-10-CM

## 2022-01-01 DIAGNOSIS — Z79899 Other long term (current) drug therapy: Secondary | ICD-10-CM | POA: Diagnosis not present

## 2022-01-01 DIAGNOSIS — E039 Hypothyroidism, unspecified: Secondary | ICD-10-CM | POA: Diagnosis not present

## 2022-01-01 DIAGNOSIS — F419 Anxiety disorder, unspecified: Secondary | ICD-10-CM | POA: Diagnosis not present

## 2022-01-01 DIAGNOSIS — C773 Secondary and unspecified malignant neoplasm of axilla and upper limb lymph nodes: Secondary | ICD-10-CM | POA: Diagnosis not present

## 2022-01-01 DIAGNOSIS — I10 Essential (primary) hypertension: Secondary | ICD-10-CM | POA: Diagnosis not present

## 2022-01-01 DIAGNOSIS — Z9221 Personal history of antineoplastic chemotherapy: Secondary | ICD-10-CM | POA: Insufficient documentation

## 2022-01-01 DIAGNOSIS — C50412 Malignant neoplasm of upper-outer quadrant of left female breast: Secondary | ICD-10-CM | POA: Diagnosis present

## 2022-01-01 DIAGNOSIS — Z170 Estrogen receptor positive status [ER+]: Secondary | ICD-10-CM | POA: Diagnosis not present

## 2022-01-01 HISTORY — PX: RADIOACTIVE SEED GUIDED AXILLARY SENTINEL LYMPH NODE: SHX6735

## 2022-01-01 HISTORY — DX: Essential (primary) hypertension: I10

## 2022-01-01 HISTORY — PX: BREAST LUMPECTOMY WITH RADIOACTIVE SEED AND SENTINEL LYMPH NODE BIOPSY: SHX6550

## 2022-01-01 SURGERY — BREAST LUMPECTOMY WITH RADIOACTIVE SEED AND SENTINEL LYMPH NODE BIOPSY
Anesthesia: Regional | Site: Breast | Laterality: Left

## 2022-01-01 MED ORDER — FENTANYL CITRATE (PF) 100 MCG/2ML IJ SOLN
100.0000 ug | Freq: Once | INTRAMUSCULAR | Status: AC
  Administered 2022-01-01: 50 ug via INTRAVENOUS

## 2022-01-01 MED ORDER — FENTANYL CITRATE (PF) 100 MCG/2ML IJ SOLN
INTRAMUSCULAR | Status: DC | PRN
  Administered 2022-01-01 (×2): 25 ug via INTRAVENOUS

## 2022-01-01 MED ORDER — FENTANYL CITRATE (PF) 100 MCG/2ML IJ SOLN
25.0000 ug | INTRAMUSCULAR | Status: DC | PRN
  Administered 2022-01-01: 50 ug via INTRAVENOUS

## 2022-01-01 MED ORDER — PROPOFOL 500 MG/50ML IV EMUL
INTRAVENOUS | Status: DC | PRN
  Administered 2022-01-01: 25 ug/kg/min via INTRAVENOUS

## 2022-01-01 MED ORDER — ONDANSETRON HCL 4 MG/2ML IJ SOLN: 4.0000 mg | Freq: Once | INTRAMUSCULAR | Status: DC | PRN

## 2022-01-01 MED ORDER — ONDANSETRON HCL 4 MG/2ML IJ SOLN
INTRAMUSCULAR | Status: DC | PRN
  Administered 2022-01-01: 4 mg via INTRAVENOUS

## 2022-01-01 MED ORDER — IBUPROFEN 800 MG PO TABS: 800.0000 mg | ORAL_TABLET | Freq: Three times a day (TID) | ORAL | 0 refills | Status: AC | PRN

## 2022-01-01 MED ORDER — FENTANYL CITRATE (PF) 100 MCG/2ML IJ SOLN
INTRAMUSCULAR | Status: AC
  Filled 2022-01-01: qty 2

## 2022-01-01 MED ORDER — METHYLENE BLUE 1 % INJ SOLN
INTRAVENOUS | Status: AC
  Filled 2022-01-01: qty 10

## 2022-01-01 MED ORDER — CELECOXIB 200 MG PO CAPS
200.0000 mg | ORAL_CAPSULE | Freq: Once | ORAL | Status: AC
  Administered 2022-01-01: 200 mg via ORAL

## 2022-01-01 MED ORDER — CHLORHEXIDINE GLUCONATE CLOTH 2 % EX PADS: 6.0000 | MEDICATED_PAD | Freq: Once | CUTANEOUS | Status: DC

## 2022-01-01 MED ORDER — ACETAMINOPHEN 500 MG PO TABS
ORAL_TABLET | ORAL | Status: AC
  Filled 2022-01-01: qty 2

## 2022-01-01 MED ORDER — CELECOXIB 200 MG PO CAPS
ORAL_CAPSULE | ORAL | Status: AC
  Filled 2022-01-01: qty 1

## 2022-01-01 MED ORDER — PHENYLEPHRINE HCL (PRESSORS) 10 MG/ML IV SOLN
INTRAVENOUS | Status: DC | PRN
  Administered 2022-01-01: 80 ug via INTRAVENOUS

## 2022-01-01 MED ORDER — LACTATED RINGERS IV SOLN: INTRAVENOUS | Status: DC

## 2022-01-01 MED ORDER — DEXAMETHASONE SODIUM PHOSPHATE 10 MG/ML IJ SOLN
INTRAMUSCULAR | Status: AC
  Filled 2022-01-01: qty 1

## 2022-01-01 MED ORDER — SODIUM CHLORIDE (PF) 0.9 % IJ SOLN
INTRAVENOUS | Status: DC | PRN
  Administered 2022-01-01: 5 mL

## 2022-01-01 MED ORDER — OXYCODONE HCL 5 MG PO TABS: 5.0000 mg | ORAL_TABLET | Freq: Once | ORAL | Status: DC | PRN

## 2022-01-01 MED ORDER — OXYCODONE HCL 5 MG PO TABS: 5.0000 mg | ORAL_TABLET | Freq: Four times a day (QID) | ORAL | 0 refills | Status: DC | PRN

## 2022-01-01 MED ORDER — LIDOCAINE HCL (CARDIAC) PF 100 MG/5ML IV SOSY
PREFILLED_SYRINGE | INTRAVENOUS | Status: DC | PRN
  Administered 2022-01-01: 80 mg via INTRAVENOUS

## 2022-01-01 MED ORDER — BUPIVACAINE-EPINEPHRINE (PF) 0.25% -1:200000 IJ SOLN
INTRAMUSCULAR | Status: AC
  Filled 2022-01-01: qty 120

## 2022-01-01 MED ORDER — DEXAMETHASONE SODIUM PHOSPHATE 4 MG/ML IJ SOLN
INTRAMUSCULAR | Status: DC | PRN
  Administered 2022-01-01: 5 mg via INTRAVENOUS

## 2022-01-01 MED ORDER — ACETAMINOPHEN 325 MG PO TABS: 325.0000 mg | ORAL_TABLET | ORAL | Status: DC | PRN

## 2022-01-01 MED ORDER — SODIUM CHLORIDE 0.9 % IV SOLN
INTRAVENOUS | Status: AC
  Filled 2022-01-01: qty 10

## 2022-01-01 MED ORDER — PROPOFOL 500 MG/50ML IV EMUL
INTRAVENOUS | Status: AC
  Filled 2022-01-01: qty 50

## 2022-01-01 MED ORDER — MAGTRACE LYMPHATIC TRACER
INTRAMUSCULAR | Status: DC | PRN
  Administered 2022-01-01: 2 mL via INTRAMUSCULAR

## 2022-01-01 MED ORDER — ACETAMINOPHEN 500 MG PO TABS
1000.0000 mg | ORAL_TABLET | Freq: Once | ORAL | Status: AC
  Administered 2022-01-01: 1000 mg via ORAL

## 2022-01-01 MED ORDER — EPHEDRINE SULFATE-NACL 50-0.9 MG/10ML-% IV SOSY
PREFILLED_SYRINGE | INTRAVENOUS | Status: DC | PRN
  Administered 2022-01-01: 10 mg via INTRAVENOUS
  Administered 2022-01-01: 5 mg via INTRAVENOUS

## 2022-01-01 MED ORDER — MIDAZOLAM HCL 2 MG/2ML IJ SOLN
2.0000 mg | Freq: Once | INTRAMUSCULAR | Status: AC
  Administered 2022-01-01: 2 mg via INTRAVENOUS

## 2022-01-01 MED ORDER — PROPOFOL 10 MG/ML IV BOLUS
INTRAVENOUS | Status: DC | PRN
  Administered 2022-01-01: 100 mg via INTRAVENOUS

## 2022-01-01 MED ORDER — CEFAZOLIN SODIUM-DEXTROSE 2-4 GM/100ML-% IV SOLN
INTRAVENOUS | Status: AC
  Filled 2022-01-01: qty 100

## 2022-01-01 MED ORDER — SODIUM CHLORIDE (PF) 0.9 % IJ SOLN
INTRAMUSCULAR | Status: AC
  Filled 2022-01-01: qty 10

## 2022-01-01 MED ORDER — ROPIVACAINE HCL 5 MG/ML IJ SOLN
INTRAMUSCULAR | Status: DC | PRN
  Administered 2022-01-01: 30 mL via PERINEURAL

## 2022-01-01 MED ORDER — MEPERIDINE HCL 25 MG/ML IJ SOLN: 6.2500 mg | INTRAMUSCULAR | Status: DC | PRN

## 2022-01-01 MED ORDER — BUPIVACAINE-EPINEPHRINE (PF) 0.25% -1:200000 IJ SOLN
INTRAMUSCULAR | Status: DC | PRN
  Administered 2022-01-01: 21 mL

## 2022-01-01 MED ORDER — ONDANSETRON HCL 4 MG/2ML IJ SOLN
INTRAMUSCULAR | Status: AC
  Filled 2022-01-01: qty 2

## 2022-01-01 MED ORDER — MIDAZOLAM HCL 2 MG/2ML IJ SOLN
INTRAMUSCULAR | Status: AC
  Filled 2022-01-01: qty 2

## 2022-01-01 MED ORDER — ACETAMINOPHEN 500 MG PO TABS: 1000.0000 mg | ORAL_TABLET | ORAL | Status: DC

## 2022-01-01 MED ORDER — LIDOCAINE 2% (20 MG/ML) 5 ML SYRINGE
INTRAMUSCULAR | Status: AC
  Filled 2022-01-01: qty 5

## 2022-01-01 MED ORDER — DEXAMETHASONE SODIUM PHOSPHATE 10 MG/ML IJ SOLN
INTRAMUSCULAR | Status: DC | PRN
  Administered 2022-01-01: 10 mg

## 2022-01-01 MED ORDER — SODIUM CHLORIDE 0.9 % IV SOLN
INTRAVENOUS | Status: DC | PRN
  Administered 2022-01-01: 500 mL

## 2022-01-01 MED ORDER — CEFAZOLIN SODIUM-DEXTROSE 2-4 GM/100ML-% IV SOLN
2.0000 g | INTRAVENOUS | Status: AC
  Administered 2022-01-01: 2 g via INTRAVENOUS

## 2022-01-01 MED ORDER — OXYCODONE HCL 5 MG/5ML PO SOLN: 5.0000 mg | Freq: Once | ORAL | Status: DC | PRN

## 2022-01-01 MED ORDER — ACETAMINOPHEN 160 MG/5ML PO SOLN: 325.0000 mg | ORAL | Status: DC | PRN

## 2022-01-01 SURGICAL SUPPLY — 49 items
ADH SKN CLS APL DERMABOND .7 (GAUZE/BANDAGES/DRESSINGS) ×1
APL PRP STRL LF DISP 70% ISPRP (MISCELLANEOUS) ×1
APPLIER CLIP 9.375 MED OPEN (MISCELLANEOUS) ×1
APR CLP MED 9.3 20 MLT OPN (MISCELLANEOUS) ×1
BINDER BREAST LRG (GAUZE/BANDAGES/DRESSINGS) IMPLANT
BINDER BREAST MEDIUM (GAUZE/BANDAGES/DRESSINGS) IMPLANT
BINDER BREAST XLRG (GAUZE/BANDAGES/DRESSINGS) IMPLANT
BINDER BREAST XXLRG (GAUZE/BANDAGES/DRESSINGS) IMPLANT
BLADE SURG 15 STRL LF DISP TIS (BLADE) ×1 IMPLANT
BLADE SURG 15 STRL SS (BLADE) ×1
CANISTER SUC SOCK COL 7IN (MISCELLANEOUS) IMPLANT
CANISTER SUCT 1200ML W/VALVE (MISCELLANEOUS) ×1 IMPLANT
CHLORAPREP W/TINT 26 (MISCELLANEOUS) ×1 IMPLANT
CLIP APPLIE 9.375 MED OPEN (MISCELLANEOUS) ×1 IMPLANT
COVER BACK TABLE 60X90IN (DRAPES) ×1 IMPLANT
COVER MAYO STAND STRL (DRAPES) ×1 IMPLANT
COVER PROBE W GEL 5X96 (DRAPES) ×1 IMPLANT
DERMABOND ADVANCED .7 DNX12 (GAUZE/BANDAGES/DRESSINGS) ×1 IMPLANT
DRAPE LAPAROSCOPIC ABDOMINAL (DRAPES) ×1 IMPLANT
DRAPE UTILITY XL STRL (DRAPES) ×1 IMPLANT
ELECT COATED BLADE 2.86 ST (ELECTRODE) ×1 IMPLANT
ELECT REM PT RETURN 9FT ADLT (ELECTROSURGICAL) ×1
ELECTRODE REM PT RTRN 9FT ADLT (ELECTROSURGICAL) ×1 IMPLANT
GLOVE BIOGEL PI IND STRL 8 (GLOVE) ×1 IMPLANT
GLOVE ECLIPSE 8.0 STRL XLNG CF (GLOVE) ×1 IMPLANT
GOWN STRL REUS W/ TWL LRG LVL3 (GOWN DISPOSABLE) ×2 IMPLANT
GOWN STRL REUS W/ TWL XL LVL3 (GOWN DISPOSABLE) ×1 IMPLANT
GOWN STRL REUS W/TWL LRG LVL3 (GOWN DISPOSABLE) ×2
GOWN STRL REUS W/TWL XL LVL3 (GOWN DISPOSABLE) ×1
HEMOSTAT ARISTA ABSORB 3G PWDR (HEMOSTASIS) IMPLANT
HEMOSTAT SNOW SURGICEL 2X4 (HEMOSTASIS) IMPLANT
KIT MARKER MARGIN INK (KITS) ×1 IMPLANT
NDL HYPO 25X1 1.5 SAFETY (NEEDLE) ×1 IMPLANT
NDL SAFETY ECLIP 18X1.5 (MISCELLANEOUS) IMPLANT
NEEDLE HYPO 25X1 1.5 SAFETY (NEEDLE) ×3 IMPLANT
NS IRRIG 1000ML POUR BTL (IV SOLUTION) ×1 IMPLANT
PACK BASIN DAY SURGERY FS (CUSTOM PROCEDURE TRAY) ×1 IMPLANT
PENCIL SMOKE EVACUATOR (MISCELLANEOUS) ×1 IMPLANT
SLEEVE SCD COMPRESS KNEE MED (STOCKING) ×1 IMPLANT
SPIKE FLUID TRANSFER (MISCELLANEOUS) IMPLANT
SPONGE T-LAP 4X18 ~~LOC~~+RFID (SPONGE) ×1 IMPLANT
SUT MNCRL AB 4-0 PS2 18 (SUTURE) ×1 IMPLANT
SUT VICRYL 3-0 CR8 SH (SUTURE) ×1 IMPLANT
SYR CONTROL 10ML LL (SYRINGE) ×1 IMPLANT
TOWEL GREEN STERILE FF (TOWEL DISPOSABLE) ×1 IMPLANT
TRACER MAGTRACE VIAL (MISCELLANEOUS) IMPLANT
TRAY FAXITRON CT DISP (TRAY / TRAY PROCEDURE) ×1 IMPLANT
TUBE CONNECTING 20X1/4 (TUBING) ×1 IMPLANT
YANKAUER SUCT BULB TIP NO VENT (SUCTIONS) ×1 IMPLANT

## 2022-01-01 NOTE — Op Note (Signed)
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 status post neoadjuvant chemotherapy  Postoperative diagnosis: Same  Procedure: Left breast seed localized lumpectomy, left axillary seed localized lymph node biopsy, left axillary sentinel lymph node mapping using mag trace in methylene blue dye  Surgeon: Erroll Luna, MD  Anesthesia: LMA with pectoral block and 0.25% Marcaine with epinephrine local  EBL: 20 cc  Specimen: Left breast mass with seed and clip verified by Faxitron, left axillar lymph node with seed and clip verified by Faxitron and 3 additional left axillary sentinel nodes    Drains: None  Indications for procedure: The patient is a 66 year old female status post neoadjuvant chemotherapy for stage II left breast 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  She has had a good response to therapy and presents for breast conservation surgery.  We also discussed the need for removing the node that was positive initial evaluation and the mapping the rest out to the to fully assess her response to neoadjuvant chemotherapy and dictate future care.  Risks and benefits of lumpectomy as well as lymph node biopsy and sentinel lymph node mapping were discussed.  The use of dyes which include 2 potential tracers that could lead to some discoloration of breast were discussed.The procedure has been discussed with the patient. Alternatives to surgery have been discussed with the patient.  Risks of surgery include bleeding,  Infection,  Seroma formation, death,  and the need for further surgery.   The patient understands and wishes to proceed. Sentinel lymph node mapping and dissection has been discussed with the patient.  Risk of bleeding,  Infection,  Seroma formation,  Additional procedures,,  Shoulder weakness , lymphedema shoulder stiffness,  Nerve and blood vessel injury and reaction to the mapping dyes have been discussed.  Alternatives to surgery have been discussed  with the patient.  The patient agrees to proceed.     Description of procedure: The patient was met in the holding area and questions were answered.  Of note she underwent seed placement as an outpatient.  All questions were answered and she underwent a left pectoral block per anesthesia protocol.  She was then taken back to the operating room.  She is placed supine upon the OR table.  After induction of general esthesia the nipple was prepped with alcohol and 2 cc of MAC trace and 4 cc of methylene blue were injected in the left breast subareolar plexus and deep to that after timeout was performed.  We massaged for 5 minutes.  She was in reprepped and redraped in a sterile fashion and a second timeout was performed.  Proper patient, site were again verified.  The procedure was verified.  The laterality was verified.  Neoprobe used to identify the seed left breast upper outer quadrant.  Curvilinear incision was made over the signal.  Dissection was carried down all tissue and the seed and clip were excised with a grossly negative margin.  Margins oriented with ink.  The Faxitron revealed the seed and clip to be present.  This cavity was irrigated.  Hemostasis was achieved.  It was then closed with a deep layer 3-0 Vicryl and 4 Monocryl.  Local anesthetic was infiltrated throughout.  Neoprobe was then used to identify the seed location left axilla.  An incision was made in the inferior axillary hairline.  Dissection was carried down to the level 1 contents.  The node containing the seed was identified and removed.  An image was taken of this which verified  both.  All images were sent to pathology.  The neoprobe was then passed off the field.  The mag trace probe was used.  Identified 3 sentinel nodes in left axilla.  These taken tracer up.  There is no evidence of any methylene blue dye though.  These were removed separately.  I then examined the lymph node basin carefully and saw no blue nodes nor any  significant spike in the mag trace probe indicating uptake.  The long thoracic nerve was identified and preserved.  The axillary vein was identified and preserved.  The thoracodorsal trunk identified and preserved.  Upon palpation I felt no other clinically significant adenopathy.  Irrigation was used.  Hemostasis achieved.  Arista placed in the cavity.  We then closed this with a deep layer 3-0 Vicryl.  4 Monocryl was used to close skin.  Dermabond applied.  Breast binder placed.  All counts were found to be correct.  The patient was awoke extubated taken recovery in satisfactory condition.

## 2022-01-01 NOTE — Anesthesia Postprocedure Evaluation (Signed)
Anesthesia Post Note  Patient: Amber Mccormick  Procedure(s) Performed: LEFT BREAST LUMPECTOMY WITH RADIOACTIVE SEED AND SENTINEL LYMPH NODE BIOPSY (Left: Breast) RADIOACTIVE SEED GUIDED LEFT AXILLARY SENTINEL LYMPH NODE BIOPSY (Left: Breast)     Patient location during evaluation: PACU Anesthesia Type: Regional and General Level of consciousness: awake and alert Pain management: pain level controlled Vital Signs Assessment: post-procedure vital signs reviewed and stable Respiratory status: spontaneous breathing, nonlabored ventilation, respiratory function stable and patient connected to nasal cannula oxygen Cardiovascular status: blood pressure returned to baseline and stable Postop Assessment: no apparent nausea or vomiting Anesthetic complications: no   No notable events documented.  Last Vitals:  Vitals:   01/01/22 1100 01/01/22 1115  BP: 132/68 139/85  Pulse: 85 97  Resp: 16 16  Temp:  36.8 C  SpO2: 93% 92%    Last Pain:  Vitals:   01/01/22 1115  TempSrc:   PainSc: 0-No pain                 Baker Moronta

## 2022-01-01 NOTE — Interval H&P Note (Signed)
History and Physical Interval Note:  01/01/2022 8:25 AM  Amber Mccormick  has presented today for surgery, with the diagnosis of LEFT BREAST CANCER.  The various methods of treatment have been discussed with the patient and family. After consideration of risks, benefits and other options for treatment, the patient has consented to  Procedure(s): LEFT BREAST LUMPECTOMY WITH RADIOACTIVE SEED AND SENTINEL LYMPH NODE BIOPSY (Left) RADIOACTIVE SEED GUIDED LEFT AXILLARY SENTINEL LYMPH NODE BIOPSY (Left) as a surgical intervention.  The patient's history has been reviewed, patient examined, no change in status, stable for surgery.  I have reviewed the patient's chart and labs.  Questions were answered to the patient's satisfaction.     Turner Daniels MD

## 2022-01-01 NOTE — Anesthesia Procedure Notes (Signed)
  Anesthesia Regional Block: Pectoralis block   Pre-Anesthetic Checklist: , timeout performed,  Correct Patient, Correct Site, Correct Laterality,  Correct Procedure, Correct Position, site marked,  Risks and benefits discussed,  Surgical consent,  Pre-op evaluation,  At surgeon's request and post-op pain management  Laterality: Left  Prep: chloraprep       Needles:  Injection technique: Single-shot  Needle Type: Echogenic Stimulator Needle     Needle Length: 5cm  Needle Gauge: 22     Additional Needles:   Procedures:,,,, ultrasound used (permanent image in chart),,    Narrative:  Start time: 01/01/2022 8:05 AM End time: 01/01/2022 8:11 AM Injection made incrementally with aspirations every 5 mL.  Performed by: Personally  Anesthesiologist: Janeece Riggers, MD  Additional Notes: Functioning IV was confirmed and monitors were applied.  A 68m 22ga Arrow echogenic stimulator needle was used. Sterile prep and drape,hand hygiene and sterile gloves were used. Ultrasound guidance: relevant anatomy identified, needle position confirmed, local anesthetic spread visualized around nerve(s)., vascular puncture avoided.  Image printed for medical record. Negative aspiration and negative test dose prior to incremental administration of local anesthetic. The patient tolerated the procedure well.

## 2022-01-01 NOTE — Discharge Instructions (Addendum)
[place] Office Phone Number [phone]  BREAST BIOPSY/ PARTIAL MASTECTOMY: POST OP INSTRUCTIONS  Always review your discharge instruction sheet given to you by the facility where your surgery was performed.  IF YOU HAVE DISABILITY OR FAMILY LEAVE FORMS, YOU MUST BRING THEM TO THE OFFICE FOR PROCESSING.  DO NOT GIVE THEM TO YOUR DOCTOR.  A prescription for pain medication may be given to you upon discharge.  Take your pain medication as prescribed, if needed.  If narcotic pain medicine is not needed, then you may take acetaminophen (Tylenol) or ibuprofen (Advil) as needed. May take Tylenol/Ibuprofen starting after 3:00PM today.   Take your usually prescribed medications unless otherwise directed If you need a refill on your pain medication, please contact your pharmacy.  They will contact our office to request authorization.  Prescriptions will not be filled after 5pm or on week-ends. You should eat very light the first 24 hours after surgery, such as soup, crackers, pudding, etc.  Resume your normal diet the day after surgery. Most patients will experience some swelling and bruising in the breast.  Ice packs and a good support bra will help.  Swelling and bruising can take several days to resolve.  It is common to experience some constipation if taking pain medication after surgery.  Increasing fluid intake and taking a stool softener will usually help or prevent this problem from occurring.  A mild laxative (Milk of Magnesia or Miralax) should be taken according to package directions if there are no bowel movements after 48 hours. Unless discharge instructions indicate otherwise, you may remove your bandages 24-48 hours after surgery, and you may shower at that time.  You may have steri-strips (small skin tapes) in place directly over the incision.  These strips should be left on the skin for 7-10 days.  If your surgeon used skin glue on the incision, you may shower in 24  hours.  The glue will flake off over the next 2-3 weeks.  Any sutures or staples will be removed at the office during your follow-up visit. ACTIVITIES:  You may resume regular daily activities (gradually increasing) beginning the next day.  Wearing a good support bra or sports bra minimizes pain and swelling.  You may have sexual intercourse when it is comfortable. You may drive when you no longer are taking prescription pain medication, you can comfortably wear a seatbelt, and you can safely maneuver your car and apply brakes. RETURN TO WORK:  ______________________________________________________________________________________ Dennis Bast should see your doctor in the office for a follow-up appointment approximately two weeks after your surgery.  Your doctor's nurse will typically make your follow-up appointment when she calls you with your pathology report.  Expect your pathology report 2-3 business days after your surgery.  You may call to check if you do not hear from [place] after three days. OTHER INSTRUCTIONS: _______________________________________________________________________________________________ _____________________________________________________________________________________________________________________________________ _____________________________________________________________________________________________________________________________________ _____________________________________________________________________________________________________________________________________  WHEN TO CALL YOUR DOCTOR: Fever over 101.0 Nausea and/or vomiting. Extreme swelling or bruising. Continued bleeding from incision. Increased pain, redness, or drainage from the incision.  The clinic staff is available to answer your questions during regular business hours.  Please don't hesitate to call and ask to speak to one of the nurses for clinical concerns.  If you have a medical emergency, go to the  nearest emergency room or call 911.  A surgeon from [facility] is always on call at the hospital.  For further questions, please visit [email or web address]     Post Anesthesia Home Care Instructions  Activity: Get plenty of rest for the remainder of the day. A responsible individual must stay with you for 24 hours following the procedure.  For the next 24 hours, DO NOT: -Drive a car -Paediatric nurse -Drink alcoholic beverages -Take any medication unless instructed by your physician -Make any legal decisions or sign important papers.  Meals: Start with liquid foods such as gelatin or soup. Progress to regular foods as tolerated. Avoid greasy, spicy, heavy foods. If nausea and/or vomiting occur, drink only clear liquids until the nausea and/or vomiting subsides. Call your physician if vomiting continues.  Special Instructions/Symptoms: Your throat may feel dry or sore from the anesthesia or the breathing tube placed in your throat during surgery. If this causes discomfort, gargle with warm salt water. The discomfort should disappear within 24 hours.

## 2022-01-01 NOTE — Progress Notes (Signed)
Assisted Dr. Oddono with left, pectoralis, ultrasound guided block. Side rails up, monitors on throughout procedure. See vital signs in flow sheet. Tolerated Procedure well. 

## 2022-01-01 NOTE — Anesthesia Procedure Notes (Signed)
Procedure Name: LMA Insertion Date/Time: 01/01/2022 8:38 AM  Performed by: Maryella Shivers, CRNAPre-anesthesia Checklist: Patient identified, Emergency Drugs available, Suction available and Patient being monitored Patient Re-evaluated:Patient Re-evaluated prior to induction Oxygen Delivery Method: Circle system utilized Preoxygenation: Pre-oxygenation with 100% oxygen Induction Type: IV induction Ventilation: Mask ventilation without difficulty LMA: LMA inserted LMA Size: 4.0 Number of attempts: 1 Airway Equipment and Method: Bite block Placement Confirmation: positive ETCO2 Tube secured with: Tape Dental Injury: Teeth and Oropharynx as per pre-operative assessment

## 2022-01-01 NOTE — Transfer of Care (Signed)
Immediate Anesthesia Transfer of Care Note  Patient: Amber Mccormick  Procedure(s) Performed: LEFT BREAST LUMPECTOMY WITH RADIOACTIVE SEED AND SENTINEL LYMPH NODE BIOPSY (Left: Breast) RADIOACTIVE SEED GUIDED LEFT AXILLARY SENTINEL LYMPH NODE BIOPSY (Left: Breast)  Patient Location: PACU  Anesthesia Type:GA combined with regional for post-op pain  Level of Consciousness: sedated  Airway & Oxygen Therapy: Patient Spontanous Breathing and Patient connected to face mask oxygen  Post-op Assessment: Report given to RN and Post -op Vital signs reviewed and stable  Post vital signs: Reviewed and stable  Last Vitals:  Vitals Value Taken Time  BP    Temp    Pulse 79 01/01/22 1017  Resp 12 01/01/22 1017  SpO2 97 % 01/01/22 1017  Vitals shown include unvalidated device data.  Last Pain:  Vitals:   01/01/22 0652  TempSrc: Oral  PainSc: 0-No pain      Patients Stated Pain Goal: 3 (44/81/85 [id number])  Complications: No notable events documented.

## 2022-01-01 NOTE — H&P (Signed)
History of Present Illness: Amber Mccormick is a 66 y.o. female who is seen today for recheck after neoadjuvant chemotherapy for triple positive left breast cancer. Repeat MRI shows reduction of size of disease in breast to about half of what it was and resolution of left axillar lymph node. She overall feels well after treatment. She has no complaints..    Review of Systems: A complete review of systems was obtained from the patient. I have reviewed this information and discussed as appropriate with the patient. See HPI as well for other ROS.  ROS   Medical History: Past Medical History:  Diagnosis Date  Anxiety  History of cancer  Thyroid disease   There is no problem list on file for this patient.  Past Surgical History:  Procedure Laterality Date  MOHS 1 STAGE HEAD/NECK/HAND/FEET/GENTIAL    No Known Allergies  Current Outpatient Medications on File Prior to Visit  Medication Sig Dispense Refill  ascorbic acid (VITA-C ORAL) Take by mouth  Ca comb no.1/vit D3/B6/FA/B12 (VITAMIN D3, CALCIUM CIT-PHOS, ORAL) Take by mouth  cetirizine HCl (ALLERGY RELIEF, CETIRIZINE, ORAL) Take by mouth  cyanocobalamin (VITAMIN B12) 500 MCG tablet Take 500 mcg by mouth once daily  levothyroxine (SYNTHROID) 50 MCG tablet TAKE 1 TABLET(50 MCG) BY MOUTH DAILY AT 6 AM  metroNIDAZOLE (METROGEL) 0.75 % (37.54m/5 gram) vaginal gel Place 1 applicator vaginally 2 (two) times daily   No current facility-administered medications on file prior to visit.   Family History  Problem Relation Age of Onset  Pancreatic cancer Father    Social History   Tobacco Use  Smoking Status Never  Smokeless Tobacco Never    Social History   Socioeconomic History  Marital status: Married  Tobacco Use  Smoking status: Never  Smokeless tobacco: Never  Substance and Sexual Activity  Alcohol use: Never  Drug use: Never   Objective:   There were no vitals filed for this visit.  There is no height or weight  on file to calculate BMI.  Physical Exam Constitutional:  Appearance: Normal appearance.  Cardiovascular:  Rate and Rhythm: Normal rate.  Pulmonary:  Effort: Pulmonary effort is normal.  Breath sounds: No stridor.  Chest:  Breasts: Right: Normal.  Left: Normal.   Comments: port Musculoskeletal:  General: Normal range of motion.  Lymphadenopathy:  Upper Body:  Right upper body: No supraclavicular or axillary adenopathy.  Left upper body: No supraclavicular or axillary adenopathy.  Neurological:  General: No focal deficit present.  Mental Status: She is alert.  Psychiatric:  Mood and Affect: Mood normal.    Labs, Imaging and Diagnostic Testing:  CLINICAL DATA: 66year old female for follow-up of known LEFT breast cancer following neoadjuvant chemotherapy.  EXAM: BILATERAL BREAST MRI WITH AND WITHOUT CONTRAST  TECHNIQUE: Multiplanar, multisequence MR images of both breasts were obtained prior to and following the intravenous administration of 7 ml of Gadavist  Three-dimensional MR images were rendered by post-processing of the original MR data on an independent workstation. The three-dimensional MR images were interpreted, and findings are reported in the following complete MRI report for this study. Three dimensional images were evaluated at the independent interpreting workstation using the DynaCAD thin client.  COMPARISON: 08/16/2021 MR and prior studies  FINDINGS: Breast composition: b. Scattered fibroglandular tissue.  Background parenchymal enhancement: Minimal  Right breast: No mass or abnormal enhancement.  Left breast: The biopsy-proven malignancy (containing biopsy clip artifact) within the posterior OUTER LEFT breast is less confluent and now measures 1.6 x 1.4 x 1.4  cm (AP x transverse x CC) , previously 2.2 x 2.1 x 2.2 cm. Mild tenting of the LEFT pectoralis muscle is again noted without evidence of muscle enhancement.  No other significant  abnormalities are identified within the LEFT breast.  Lymph nodes: No abnormal appearing lymph nodes are noted. Biopsy clip artifact within normal sized LEFT axillary lymph node noted.  Ancillary findings: Ascending thoracic aortic aneurysm measuring 4 cm is unchanged. Port-A-Cath in the UPPER RIGHT chest is now identified.  IMPRESSION: 1. Treatment response with decreased size of known OUTER LEFT breast malignancy, now measuring 1.6 x 1.4 x 1.4 cm, previously 2.2 x 2.1 x 2.2 cm. Biopsy-proven malignant LEFT axillary lymph node is now normal in appearance. 2. No evidence of RIGHT breast malignancy or metastatic disease. 3. Unchanged 4 cm ascending thoracic aortic aneurysm. Recommend annual imaging followup by CTA or MRA. This recommendation follows 2010 ACCF/AHA/AATS/ACR/ASA/SCA/SCAI/SIR/STS/SVM Guidelines for the Diagnosis and Management of Patients with Thoracic Aortic Disease. Circulation. 2010; 121: N406-E403. Aortic aneurysm NOS (ICD10-I71.9)  RECOMMENDATION: Treatment plan  BI-RADS CATEGORY 6: Known biopsy-proven malignancy.   Electronically Signed By: Margarette Canada M.D. On: 12/10/2021 08:02  Clinical stage from 08/08/2021: Stage IB (cT1c, cN1(f), cM0, G2, ER+, PR+, HER2+) - Signed by Nicholas Lose, MD on 08/08/2021   Assessment and Plan:   Diagnoses and all orders for this visit:  Malignant neoplasm of upper-outer quadrant of left breast in female, estrogen receptor positive (CMS-HCC)    Patient with good response to chemotherapy.  Recommend left breast seed localized lumpectomy with left axillary sentinel lymph node mapping using mag trace as well as targeted left axillary tail biopsy with seed localization. All treatment options were discussed. She would like to proceed with breast conserving surgery.  Discussed the use of seeds. Discussed mag trace and potential discoloration issues. Discussed lymphedema, shoulder stiffness and pain..  Contrasted breast conserving  surgery with mastectomy  Port-A-Cath will stay in place for now for future treatment of her triple positive left breast cancer.  The procedure has been discussed with the patient. Alternatives to surgery have been discussed with the patient. Risks of surgery include bleeding, Infection, Seroma formation, death, and the need for further surgery. The patient understands and wishes to proceed.   No follow-ups on file.  Kennieth Francois, MD

## 2022-01-03 LAB — SURGICAL PATHOLOGY

## 2022-01-05 ENCOUNTER — Encounter (HOSPITAL_BASED_OUTPATIENT_CLINIC_OR_DEPARTMENT_OTHER): Payer: Self-pay | Admitting: Surgery

## 2022-01-06 ENCOUNTER — Telehealth: Payer: Self-pay | Admitting: Hematology and Oncology

## 2022-01-06 NOTE — Telephone Encounter (Signed)
Scheduled appointment per WQ. Patient is aware. 

## 2022-01-07 ENCOUNTER — Encounter: Payer: Self-pay | Admitting: *Deleted

## 2022-01-07 ENCOUNTER — Encounter (HOSPITAL_COMMUNITY): Payer: Self-pay

## 2022-01-09 ENCOUNTER — Inpatient Hospital Stay: Payer: Medicare Other | Attending: Hematology and Oncology | Admitting: Hematology and Oncology

## 2022-01-09 DIAGNOSIS — C773 Secondary and unspecified malignant neoplasm of axilla and upper limb lymph nodes: Secondary | ICD-10-CM | POA: Insufficient documentation

## 2022-01-09 DIAGNOSIS — Z5112 Encounter for antineoplastic immunotherapy: Secondary | ICD-10-CM | POA: Insufficient documentation

## 2022-01-09 DIAGNOSIS — Z170 Estrogen receptor positive status [ER+]: Secondary | ICD-10-CM | POA: Insufficient documentation

## 2022-01-09 DIAGNOSIS — Z79899 Other long term (current) drug therapy: Secondary | ICD-10-CM | POA: Insufficient documentation

## 2022-01-09 DIAGNOSIS — C50412 Malignant neoplasm of upper-outer quadrant of left female breast: Secondary | ICD-10-CM | POA: Insufficient documentation

## 2022-01-09 NOTE — Assessment & Plan Note (Signed)
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 Neoadjuvant chemotherapy withTCHP x6 completed 12/05/2021 followed by HP maintenance versus Kadcyla maintenance 2. 01/01/2022: Left lumpectomy: Grade 2 IDC 1.5 cm with intermediate grade DCIS, margins negative, lymphovascular invasion identified, 2/6 lymph nodes positive ER 90%, PR 90%, Ki-67 5%, HER2 1+ 3. Followed by adjuvant radiation therapy 4.Followed by adjuvant antiestrogen therapy -------------------------------------------------------------------------------------------------------------------------------------- I recommended participation in the Compass HER2 clinical trial The trial randomizes patients have residual disease to Kadcyla versus Kadcyla plus Tucatinib. We discussed the risks and benefits of participating in the trial.  She will be referred for radiation therapy.

## 2022-01-09 NOTE — Progress Notes (Signed)
HEMATOLOGY-ONCOLOGY TELEPHONE VISIT PROGRESS NOTE  I connected with our patient on 01/09/22 at  8:15 AM EDT by telephone and verified that I am speaking with the correct person using two identifiers.  I discussed the limitations, risks, security and privacy concerns of performing an evaluation and management service by telephone and the availability of in person appointments.  I also discussed with the patient that there may be a patient responsible charge related to this service. The patient expressed understanding and agreed to proceed.   History of Present Illness: Follow-up after recent surgery for breast cancer  Oncology History  Malignant neoplasm of upper-outer quadrant of left breast, estrogen receptor positive ([place])  07/01/2021 Initial Diagnosis   Screening mammogram 06/27/2021 detected abnormality in the left breast which led to diagnostic mammogram and left breast ultrasound that revealed hypoechoic mass with irregular borders 2:30 position 2 cm left axilla: 1 abnormal lymph node, grade 2 IDC with DCIS ER 99%, PR 95%, HER2 2+ by IHC, FISH positive, Ki-67 20%, left axilla lymph node: Positive   08/08/2021 Cancer Staging   Staging form: Breast, AJCC 8th Edition - Clinical stage from 08/08/2021: Stage IB (cT1c, cN1(f), cM0, G2, ER+, PR+, HER2+) - Signed by Nicholas Lose, MD on 08/08/2021 Stage prefix: Initial diagnosis Method of lymph node assessment: Core biopsy Histologic grading system: 3 grade system   08/14/2021 -  Chemotherapy   Patient is on Treatment Plan : BREAST  Docetaxel + Carboplatin + Trastuzumab + Pertuzumab  (TCHP) q21d / Trastuzumab + Pertuzumab q21d     08/22/2021 - 11/14/2021 Chemotherapy   Patient is on Treatment Plan : BREAST  Docetaxel + Carboplatin + Trastuzumab + Pertuzumab  (TCHP) q21d        REVIEW OF SYSTEMS:   Constitutional: Denies fevers, chills or abnormal weight loss All other systems were reviewed with the patient and are  negative. Observations/Objective:     Assessment Plan:  Malignant neoplasm of upper-outer quadrant of left breast, estrogen receptor positive ([place]) 07/01/2021:Screening mammogram 06/27/2021 detected abnormality in the left breast which led to diagnostic mammogram and left breast ultrasound that revealed hypoechoic mass with irregular borders 2:30 position 2 cm left axilla: 1 abnormal lymph node, 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 Neoadjuvant chemotherapy with TCHP x6 completed 12/05/2021 followed by HP maintenance versus Kadcyla maintenance 2. 01/01/2022: Left lumpectomy: Grade 2 IDC 1.5 cm with intermediate grade DCIS, margins negative, lymphovascular invasion identified, 2/6 lymph nodes positive ER 90%, PR 90%, Ki-67 5%, HER2 1+ 3. Followed by adjuvant radiation therapy 4.  Followed by adjuvant antiestrogen therapy -------------------------------------------------------------------------------------------------------------------------------------- I recommended participation in the Compass HER2 clinical trial The trial randomizes patients have residual disease to Kadcyla versus Kadcyla plus Tucatinib. We will assess if she is eligible to participate in the trial.  Note surgery discussion: Because she had 2 positive lymph nodes even after neoadjuvant chemotherapy, the standard of care would be to do full axillary lymph node dissection but given the fact that she had 4 additional lymph nodes were negative, the relative risks and benefits were discussed.  My personal opinion is that she does not need any additional lymph node surgery since she is going to do radiation.   She will be referred for radiation therapy.  I discussed the assessment and treatment plan with the patient. The patient was provided an opportunity to ask questions and all were answered. The  patient agreed with the plan and  demonstrated an understanding of the instructions. The patient was advised to call back or seek an in-person evaluation if the symptoms worsen or if the condition fails to improve as anticipated.   I provided 15 minutes of non-face-to-face time during this encounter.  This includes time for charting and coordination of care   Harriette Ohara, MD

## 2022-01-10 ENCOUNTER — Encounter: Payer: Self-pay | Admitting: *Deleted

## 2022-01-10 ENCOUNTER — Telehealth: Payer: Self-pay

## 2022-01-10 DIAGNOSIS — Z170 Estrogen receptor positive status [ER+]: Secondary | ICD-10-CM

## 2022-01-10 NOTE — Telephone Encounter (Signed)
CompassHER2 Residual Disease, A Double-Blinded, Phase III Randomized Trial of T-DM1 and Placebo Compared with T-DMI and Tucatinib:   Called Amber Mccormick to discuss study interest. Amber Mccormick is in the carpool lane and doesn't have a lot of time to talk. I briefly introduced the study. Amber Mccormick agreed that I can mail Amber [facility] copies of the consents to review, and I will go follow up with Amber Mccormick during Amber [facility] visit next week. Patient does not have an email address, so I send hard copy versions to Amber Mccormick home address.  Patient Amber Mccormick was identified by Dr Lindi Adie as a potential candidate for the above listed study. This Clinical Research Nurse spoke with Amber Mccormick, [id number], on today via phone to discuss participation in the above listed research study. A copy of the informed consent document and separate HIPAA Authorization were mailed to the patient; this RN walked the envelope to the mail room.  Patient reads, speaks, and understands [place].    Patient was provided with the business card of this Nurse and encouraged to contact the research team with any questions.  Approximately 5 minutes were spent with the patient introducing the study.  Amber Skiff Cait Locust, RN, BSN, [facility]  [facility]  [facility] Clinical Research Nurse [place] [phone]  Pager [phone] 01/10/2022 3:07 PM

## 2022-01-14 NOTE — Progress Notes (Signed)
Patient Care Team: Kathreen Devoid, PA-C as PCP - Philomena Doheny, Paulette Blanch, RN as Oncology Nurse Navigator Rockwell Germany, RN as Oncology Nurse Navigator Nicholas Lose, MD as Consulting Physician (Hematology and Oncology) Minus Breeding, MD as Consulting Physician (Cardiology) Paralee Cancel, MD as Consulting Physician (Orthopedic Surgery)  DIAGNOSIS: No diagnosis found.  SUMMARY OF ONCOLOGIC HISTORY: Oncology History  Malignant neoplasm of upper-outer quadrant of left breast, estrogen receptor positive ([place])  07/01/2021 Initial Diagnosis   Screening mammogram 06/27/2021 detected abnormality in the left breast which led to diagnostic mammogram and left breast ultrasound that revealed hypoechoic mass with irregular borders 2:30 position 2 cm left axilla: 1 abnormal lymph node, grade 2 IDC with DCIS ER 99%, PR 95%, HER2 2+ by IHC, FISH positive, Ki-67 20%, left axilla lymph node: Positive   08/08/2021 Cancer Staging   Staging form: Breast, AJCC 8th Edition - Clinical stage from 08/08/2021: Stage IB (cT1c, cN1(f), cM0, G2, ER+, PR+, HER2+) - Signed by Nicholas Lose, MD on 08/08/2021 Stage prefix: Initial diagnosis Method of lymph node assessment: Core biopsy Histologic grading system: 3 grade system   08/14/2021 -  Chemotherapy   Patient is on Treatment Plan : BREAST  Docetaxel + Carboplatin + Trastuzumab + Pertuzumab  (TCHP) q21d / Trastuzumab + Pertuzumab q21d     08/22/2021 - 11/14/2021 Chemotherapy   Patient is on Treatment Plan : BREAST  Docetaxel + Carboplatin + Trastuzumab + Pertuzumab  (TCHP) q21d        CHIEF COMPLIANT: Follow-up after radiation  INTERVAL HISTORY: Neila Teem is a 66 y.o with the above-mentioned. She presents to the clinic for a follow-up.    ALLERGIES:  has No Known Allergies.  MEDICATIONS:  Current Outpatient Medications  Medication Sig Dispense Refill   amitriptyline (ELAVIL) 10 MG tablet Take 10 mg by mouth at bedtime.     amLODipine  (NORVASC) 5 MG tablet Take 5 mg by mouth daily. (Patient not taking: Reported on 12/26/2021)     atorvastatin (LIPITOR) 10 MG tablet Take 10 mg by mouth daily.     cholecalciferol (VITAMIN D3) 25 MCG (1000 UNIT) tablet Take 1,000 Units by mouth daily.     diphenhydrAMINE (BENADRYL) 25 MG tablet Take 25 mg by mouth every 6 (six) hours as needed.     diphenoxylate-atropine (LOMOTIL) 2.5-0.025 MG tablet Take 1 tablet by mouth 4 (four) times daily as needed for diarrhea or loose stools. 30 tablet 3   ibuprofen (ADVIL) 800 MG tablet Take 1 tablet (800 mg total) by mouth every 8 (eight) hours as needed. 30 tablet 0   ibuprofen (ADVIL) 800 MG tablet Take 1 tablet (800 mg total) by mouth every 8 (eight) hours as needed. 30 tablet 0   levothyroxine (SYNTHROID) 50 MCG tablet Take 50 mcg by mouth daily.     metroNIDAZOLE (METROCREAM) 0.75 % cream Apply topically 2 (two) times daily as needed.     oxyCODONE (OXY IR/ROXICODONE) 5 MG immediate release tablet Take 1 tablet (5 mg total) by mouth every 6 (six) hours as needed for severe pain. 15 tablet 0   vitamin B-12 (CYANOCOBALAMIN) 500 MCG tablet Take by mouth. (Patient not taking: Reported on 12/26/2021)     vitamin C (ASCORBIC ACID) 500 MG tablet Take 500 mg by mouth daily.     No current facility-administered medications for this visit.    PHYSICAL EXAMINATION: ECOG PERFORMANCE STATUS: {CHL ONC ECOG [phone]}  There were no vitals filed for this visit. There were no vitals  filed for this visit.  BREAST:*** No palpable masses or nodules in either right or left breasts. No palpable axillary supraclavicular or infraclavicular adenopathy no breast tenderness or nipple discharge. (exam performed in the presence of a chaperone)  LABORATORY DATA:  I have reviewed the data as listed    Latest Ref Rng & Units 12/26/2021   10:44 AM 12/05/2021    9:37 AM 11/14/2021    9:50 AM  CMP  Glucose 70 - 99 mg/dL 86  77  67   BUN 8 - 23 mg/dL 14  14  15    Creatinine 0.44 - 1.00 mg/dL 0.55  0.56  0.56   Sodium 135 - 145 mmol/L 140  140  141   Potassium 3.5 - 5.1 mmol/L 4.2  3.4  3.2   Chloride 98 - 111 mmol/L 103  107  106   CO2 22 - 32 mmol/L 31  30  29   Calcium 8.9 - 10.3 mg/dL 8.9  9.1  9.3   Total Protein 6.5 - 8.1 g/dL 6.7  6.4  6.8   Total Bilirubin 0.3 - 1.2 mg/dL 0.3  0.3  0.4   Alkaline Phos 38 - 126 U/L 111  95  91   AST 15 - 41 U/L 21  17  18   ALT 0 - 44 U/L 13  14  14     Lab Results  Component Value Date   WBC 5.8 12/26/2021   HGB 12.0 12/26/2021   HCT 35.5 (L) 12/26/2021   MCV 93.7 12/26/2021   PLT 186 12/26/2021   NEUTROABS 3.8 12/26/2021    ASSESSMENT & PLAN:  No problem-specific Assessment & Plan notes found for this encounter.    No orders of the defined types were placed in this encounter.  The patient has a good understanding of the overall plan. she agrees with it. she will call with any problems that may develop before the next visit here. Total time spent: 30 mins including face to face time and time spent for planning, charting and co-ordination of care   Deritra L Mcnairy, CMA 01/14/22    I Deritra, Mcnairy am scribing for Dr. Gudena  ***  

## 2022-01-16 ENCOUNTER — Encounter: Payer: Self-pay | Admitting: *Deleted

## 2022-01-16 ENCOUNTER — Inpatient Hospital Stay (HOSPITAL_BASED_OUTPATIENT_CLINIC_OR_DEPARTMENT_OTHER): Payer: Medicare Other | Admitting: Hematology and Oncology

## 2022-01-16 ENCOUNTER — Inpatient Hospital Stay: Payer: Medicare Other

## 2022-01-16 ENCOUNTER — Other Ambulatory Visit: Payer: Self-pay

## 2022-01-16 VITALS — BP 146/76 | HR 86 | Temp 97.5°F | Resp 17 | Ht 65.5 in | Wt 154.2 lb

## 2022-01-16 DIAGNOSIS — Z170 Estrogen receptor positive status [ER+]: Secondary | ICD-10-CM | POA: Diagnosis not present

## 2022-01-16 DIAGNOSIS — Z95828 Presence of other vascular implants and grafts: Secondary | ICD-10-CM

## 2022-01-16 DIAGNOSIS — C50412 Malignant neoplasm of upper-outer quadrant of left female breast: Secondary | ICD-10-CM

## 2022-01-16 DIAGNOSIS — Z5112 Encounter for antineoplastic immunotherapy: Secondary | ICD-10-CM | POA: Diagnosis present

## 2022-01-16 DIAGNOSIS — Z79899 Other long term (current) drug therapy: Secondary | ICD-10-CM | POA: Diagnosis not present

## 2022-01-16 DIAGNOSIS — C773 Secondary and unspecified malignant neoplasm of axilla and upper limb lymph nodes: Secondary | ICD-10-CM | POA: Diagnosis not present

## 2022-01-16 LAB — CBC WITH DIFFERENTIAL (CANCER CENTER ONLY)
Abs Immature Granulocytes: 0.02 10*3/uL (ref 0.00–0.07)
Basophils Absolute: 0 10*3/uL (ref 0.0–0.1)
Basophils Relative: 1 %
Eosinophils Absolute: 0.4 10*3/uL (ref 0.0–0.5)
Eosinophils Relative: 7 %
HCT: 35.9 % — ABNORMAL LOW (ref 36.0–46.0)
Hemoglobin: 12.1 g/dL (ref 12.0–15.0)
Immature Granulocytes: 0 %
Lymphocytes Relative: 27 %
Lymphs Abs: 1.6 10*3/uL (ref 0.7–4.0)
MCH: 31.4 pg (ref 26.0–34.0)
MCHC: 33.7 g/dL (ref 30.0–36.0)
MCV: 93.2 fL (ref 80.0–100.0)
Monocytes Absolute: 0.4 10*3/uL (ref 0.1–1.0)
Monocytes Relative: 6 %
Neutro Abs: 3.4 10*3/uL (ref 1.7–7.7)
Neutrophils Relative %: 59 %
Platelet Count: 183 10*3/uL (ref 150–400)
RBC: 3.85 MIL/uL — ABNORMAL LOW (ref 3.87–5.11)
RDW: 14.2 % (ref 11.5–15.5)
WBC Count: 5.8 10*3/uL (ref 4.0–10.5)
nRBC: 0 % (ref 0.0–0.2)

## 2022-01-16 LAB — CMP (CANCER CENTER ONLY)
ALT: 21 U/L (ref 0–44)
AST: 26 U/L (ref 15–41)
Albumin: 3.7 g/dL (ref 3.5–5.0)
Alkaline Phosphatase: 103 U/L (ref 38–126)
Anion gap: 3 — ABNORMAL LOW (ref 5–15)
BUN: 12 mg/dL (ref 8–23)
CO2: 30 mmol/L (ref 22–32)
Calcium: 9 mg/dL (ref 8.9–10.3)
Chloride: 107 mmol/L (ref 98–111)
Creatinine: 0.59 mg/dL (ref 0.44–1.00)
GFR, Estimated: >60 mL/min (ref >60)
Glucose, Bld: 97 mg/dL (ref 70–99)
Potassium: 4.1 mmol/L (ref 3.5–5.1)
Sodium: 140 mmol/L (ref 135–145)
Total Bilirubin: 0.4 mg/dL (ref 0.3–1.2)
Total Protein: 6.2 g/dL — ABNORMAL LOW (ref 6.5–8.1)

## 2022-01-16 MED ORDER — SODIUM CHLORIDE 0.9% FLUSH
10.0000 mL | Freq: Once | INTRAVENOUS | Status: AC
  Administered 2022-01-16: 10 mL

## 2022-01-16 NOTE — Research (Signed)
Trial:  DCP-001: Use of a Clinical Trial Screening Tool to Address Cancer Health Disparities in the [place] Program [facility])  Patient Amber Mccormick was identified by research nurse as a potential candidate for the above listed study.  This Clinical Research Nurse met with Amber Mccormick, [id number], on 01/16/22 in a manner and location that ensures patient privacy to discuss participation in the above listed research study.  Patient is Accompanied by her husband .   Approximately 5 minutes were spent with the patient informing her about DCP-001.  Patient states she will think about it and is okay with research nurse coming to talk to her next week about it while she is infusion.  Thanked patient for her time today.  Foye Spurling, BSN, RN, [place] Nurse II [phone] 01/16/2022

## 2022-01-16 NOTE — Assessment & Plan Note (Signed)
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 Neoadjuvant chemotherapy withTCHP x6 completed 12/05/2021 followed by Kadcyla maintenance 2. 01/01/2022: Left lumpectomy: Grade 2 IDC 1.5 cm with intermediate grade DCIS, margins negative, lymphovascular invasion identified, 2/6 lymph nodes positive ER 90%, PR 90%, Ki-67 5%, HER2 1+ 3. Followed by adjuvant radiation therapy 4.Followed by adjuvant antiestrogen therapy -------------------------------------------------------------------------------------------------------------------------------------- Current Treatment: Steward Drone

## 2022-01-16 NOTE — Research (Signed)
Trial:  CompassHER2 Residual Disease, A Double-Blinded, Phase III Randomized Trial of T-DM1 and Placebo Compared with T-DMI and Tucatinib:  Patient Amber Mccormick was identified by Dr. Lindi Adie as a potential candidate for the above listed study.  This Clinical Research Nurse met with Amber Mccormick, [id number], on 01/16/22 in a manner and location that ensures patient privacy to discuss participation in the above listed research study.  Patient is Accompanied by her husband Amber Mccormick .  A copy of the informed consent document and separate HIPAA Authorization was mailed to the patient but she says she has not received it yet.  Reviewed the ICF in clinic with patient.     Approximately 10 minutes were spent with the patient reviewing the informed consent document and explaining the study.  Patient was provided the option of taking informed consent documents home to review and was encouraged to review at their convenience with their support network, including other care providers. Patient is comfortable with making a decision regarding study participation today. Patient declined study participation. She states she does not want to take any extra treatment other than the standard of care due to potential side effects.  Patient states she is just now starting to feel better from her neoadjuvant treatment and doesn't want to take anything else that she does not need to take at this point in her treatment.   Thanked patient for her time and willingness to meet and discuss this study with research nurse today. Dr. Lindi Adie notified.  Foye Spurling, BSN, RN, [place] Nurse II [phone] 01/16/2022

## 2022-01-17 ENCOUNTER — Other Ambulatory Visit: Payer: Self-pay

## 2022-01-20 NOTE — Progress Notes (Signed)
Patient Care Team: Kathreen Devoid, PA-C as PCP - Philomena Doheny, Paulette Blanch, RN as Oncology Nurse Navigator Rockwell Germany, RN as Oncology Nurse Navigator Nicholas Lose, MD as Consulting Physician (Hematology and Oncology) Minus Breeding, MD as Consulting Physician (Cardiology) Paralee Cancel, MD as Consulting Physician (Orthopedic Surgery)  DIAGNOSIS: No diagnosis found.  SUMMARY OF ONCOLOGIC HISTORY: Oncology History  Malignant neoplasm of upper-outer quadrant of left breast, estrogen receptor positive ([place])  07/01/2021 Initial Diagnosis   Screening mammogram 06/27/2021 detected abnormality in the left breast which led to diagnostic mammogram and left breast ultrasound that revealed hypoechoic mass with irregular borders 2:30 position 2 cm left axilla: 1 abnormal lymph node, grade 2 IDC with DCIS ER 99%, PR 95%, HER2 2+ by IHC, FISH positive, Ki-67 20%, left axilla lymph node: Positive   08/08/2021 Cancer Staging   Staging form: Breast, AJCC 8th Edition - Clinical stage from 08/08/2021: Stage IB (cT1c, cN1(f), cM0, G2, ER+, PR+, HER2+) - Signed by Nicholas Lose, MD on 08/08/2021 Stage prefix: Initial diagnosis Method of lymph node assessment: Core biopsy Histologic grading system: 3 grade system   08/14/2021 - 12/26/2021 Chemotherapy   Patient is on Treatment Plan : BREAST  Docetaxel + Carboplatin + Trastuzumab + Pertuzumab  (TCHP) q21d / Trastuzumab + Pertuzumab q21d     08/22/2021 - 11/14/2021 Chemotherapy   Patient is on Treatment Plan : BREAST  Docetaxel + Carboplatin + Trastuzumab + Pertuzumab  (TCHP) q21d      01/23/2022 -  Chemotherapy   Patient is on Treatment Plan : BREAST ADO-Trastuzumab Emtansine (Kadcyla) q21d       CHIEF COMPLIANT:   INTERVAL HISTORY: Amber Mccormick is a   ALLERGIES:  has No Known Allergies.  MEDICATIONS:  Current Outpatient Medications  Medication Sig Dispense Refill   amitriptyline (ELAVIL) 10 MG tablet Take 10 mg by mouth at bedtime.      amLODipine (NORVASC) 5 MG tablet Take 5 mg by mouth daily. (Patient not taking: Reported on 12/26/2021)     atorvastatin (LIPITOR) 10 MG tablet Take 10 mg by mouth daily.     cholecalciferol (VITAMIN D3) 25 MCG (1000 UNIT) tablet Take 1,000 Units by mouth daily.     diphenhydrAMINE (BENADRYL) 25 MG tablet Take 25 mg by mouth every 6 (six) hours as needed.     diphenoxylate-atropine (LOMOTIL) 2.5-0.025 MG tablet Take 1 tablet by mouth 4 (four) times daily as needed for diarrhea or loose stools. 30 tablet 3   ibuprofen (ADVIL) 800 MG tablet Take 1 tablet (800 mg total) by mouth every 8 (eight) hours as needed. 30 tablet 0   ibuprofen (ADVIL) 800 MG tablet Take 1 tablet (800 mg total) by mouth every 8 (eight) hours as needed. 30 tablet 0   levothyroxine (SYNTHROID) 50 MCG tablet Take 50 mcg by mouth daily.     metroNIDAZOLE (METROCREAM) 0.75 % cream Apply topically 2 (two) times daily as needed.     oxyCODONE (OXY IR/ROXICODONE) 5 MG immediate release tablet Take 1 tablet (5 mg total) by mouth every 6 (six) hours as needed for severe pain. 15 tablet 0   vitamin B-12 (CYANOCOBALAMIN) 500 MCG tablet Take by mouth. (Patient not taking: Reported on 12/26/2021)     vitamin C (ASCORBIC ACID) 500 MG tablet Take 500 mg by mouth daily.     No current facility-administered medications for this visit.    PHYSICAL EXAMINATION: ECOG PERFORMANCE STATUS: {CHL ONC ECOG [phone]}  There were no vitals filed for this  visit. There were no vitals filed for this visit.  BREAST:*** No palpable masses or nodules in either right or left breasts. No palpable axillary supraclavicular or infraclavicular adenopathy no breast tenderness or nipple discharge. (exam performed in the presence of a chaperone)  LABORATORY DATA:  I have reviewed the data as listed    Latest Ref Rng & Units 01/16/2022    9:47 AM 12/26/2021   10:44 AM 12/05/2021    9:37 AM  CMP  Glucose 70 - 99 mg/dL 97  86  77   BUN 8 - 23 mg/dL '12  14   14   ' Creatinine 0.44 - 1.00 mg/dL 0.59  0.55  0.56   Sodium 135 - 145 mmol/L 140  140  140   Potassium 3.5 - 5.1 mmol/L 4.1  4.2  3.4   Chloride 98 - 111 mmol/L 107  103  107   CO2 22 - 32 mmol/L '30  31  30   ' Calcium 8.9 - 10.3 mg/dL 9.0  8.9  9.1   Total Protein 6.5 - 8.1 g/dL 6.2  6.7  6.4   Total Bilirubin 0.3 - 1.2 mg/dL 0.4  0.3  0.3   Alkaline Phos 38 - 126 U/L 103  111  95   AST 15 - 41 U/L '26  21  17   ' ALT 0 - 44 U/L '21  13  14     ' Lab Results  Component Value Date   WBC 5.8 01/16/2022   HGB 12.1 01/16/2022   HCT 35.9 (L) 01/16/2022   MCV 93.2 01/16/2022   PLT 183 01/16/2022   NEUTROABS 3.4 01/16/2022    ASSESSMENT & PLAN:  No problem-specific Assessment & Plan notes found for this encounter.    No orders of the defined types were placed in this encounter.  The patient has a good understanding of the overall plan. she agrees with it. she will call with any problems that may develop before the next visit here. Total time spent: 30 mins including face to face time and time spent for planning, charting and co-ordination of care   Suzzette Righter, [place] 01/20/22    I Gardiner Coins am scribing for Dr. Lindi Adie  ***

## 2022-01-21 ENCOUNTER — Other Ambulatory Visit: Payer: Self-pay

## 2022-01-22 ENCOUNTER — Inpatient Hospital Stay: Payer: Medicare Other

## 2022-01-22 ENCOUNTER — Other Ambulatory Visit: Payer: Self-pay

## 2022-01-22 ENCOUNTER — Inpatient Hospital Stay (HOSPITAL_BASED_OUTPATIENT_CLINIC_OR_DEPARTMENT_OTHER): Payer: Medicare Other | Admitting: Hematology and Oncology

## 2022-01-22 VITALS — BP 135/61 | HR 73 | Temp 98.1°F | Resp 18

## 2022-01-22 DIAGNOSIS — Z95828 Presence of other vascular implants and grafts: Secondary | ICD-10-CM

## 2022-01-22 DIAGNOSIS — C50412 Malignant neoplasm of upper-outer quadrant of left female breast: Secondary | ICD-10-CM | POA: Diagnosis not present

## 2022-01-22 DIAGNOSIS — Z170 Estrogen receptor positive status [ER+]: Secondary | ICD-10-CM

## 2022-01-22 DIAGNOSIS — Z5112 Encounter for antineoplastic immunotherapy: Secondary | ICD-10-CM | POA: Diagnosis not present

## 2022-01-22 LAB — CBC WITH DIFFERENTIAL (CANCER CENTER ONLY)
Abs Immature Granulocytes: 0.01 10*3/uL (ref 0.00–0.07)
Basophils Absolute: 0 10*3/uL (ref 0.0–0.1)
Basophils Relative: 1 %
Eosinophils Absolute: 0.5 10*3/uL (ref 0.0–0.5)
Eosinophils Relative: 10 %
HCT: 36.7 % (ref 36.0–46.0)
Hemoglobin: 12.4 g/dL (ref 12.0–15.0)
Immature Granulocytes: 0 %
Lymphocytes Relative: 33 %
Lymphs Abs: 1.9 10*3/uL (ref 0.7–4.0)
MCH: 31.3 pg (ref 26.0–34.0)
MCHC: 33.8 g/dL (ref 30.0–36.0)
MCV: 92.7 fL (ref 80.0–100.0)
Monocytes Absolute: 0.5 10*3/uL (ref 0.1–1.0)
Monocytes Relative: 8 %
Neutro Abs: 2.7 10*3/uL (ref 1.7–7.7)
Neutrophils Relative %: 48 %
Platelet Count: 182 10*3/uL (ref 150–400)
RBC: 3.96 MIL/uL (ref 3.87–5.11)
RDW: 13.7 % (ref 11.5–15.5)
WBC Count: 5.6 10*3/uL (ref 4.0–10.5)
nRBC: 0 % (ref 0.0–0.2)

## 2022-01-22 LAB — CMP (CANCER CENTER ONLY)
ALT: 16 U/L (ref 0–44)
AST: 20 U/L (ref 15–41)
Albumin: 3.7 g/dL (ref 3.5–5.0)
Alkaline Phosphatase: 113 U/L (ref 38–126)
Anion gap: 4 — ABNORMAL LOW (ref 5–15)
BUN: 17 mg/dL (ref 8–23)
CO2: 29 mmol/L (ref 22–32)
Calcium: 9 mg/dL (ref 8.9–10.3)
Chloride: 106 mmol/L (ref 98–111)
Creatinine: 0.66 mg/dL (ref 0.44–1.00)
GFR, Estimated: >60 mL/min (ref >60)
Glucose, Bld: 90 mg/dL (ref 70–99)
Potassium: 3.8 mmol/L (ref 3.5–5.1)
Sodium: 139 mmol/L (ref 135–145)
Total Bilirubin: 0.4 mg/dL (ref 0.3–1.2)
Total Protein: 6.5 g/dL (ref 6.5–8.1)

## 2022-01-22 MED ORDER — PROCHLORPERAZINE MALEATE 10 MG PO TABS
10.0000 mg | ORAL_TABLET | Freq: Once | ORAL | Status: AC
  Administered 2022-01-22: 10 mg via ORAL
  Filled 2022-01-22: qty 1

## 2022-01-22 MED ORDER — SODIUM CHLORIDE 0.9% FLUSH
10.0000 mL | Freq: Once | INTRAVENOUS | Status: AC
  Administered 2022-01-22: 10 mL

## 2022-01-22 MED ORDER — HEPARIN SOD (PORK) LOCK FLUSH 100 UNIT/ML IV SOLN
500.0000 [IU] | Freq: Once | INTRAVENOUS | Status: AC | PRN
  Administered 2022-01-22: 500 [IU]

## 2022-01-22 MED ORDER — DIPHENHYDRAMINE HCL 25 MG PO CAPS
25.0000 mg | ORAL_CAPSULE | Freq: Once | ORAL | Status: AC
  Administered 2022-01-22: 25 mg via ORAL
  Filled 2022-01-22: qty 1

## 2022-01-22 MED ORDER — SODIUM CHLORIDE 0.9% FLUSH
10.0000 mL | INTRAVENOUS | Status: DC | PRN
  Administered 2022-01-22: 10 mL

## 2022-01-22 MED ORDER — SODIUM CHLORIDE 0.9 % IV SOLN
3.6000 mg/kg | Freq: Once | INTRAVENOUS | Status: AC
  Administered 2022-01-22: 260 mg via INTRAVENOUS
  Filled 2022-01-22: qty 5

## 2022-01-22 MED ORDER — SODIUM CHLORIDE 0.9 % IV SOLN: Freq: Once | INTRAVENOUS | Status: AC

## 2022-01-22 MED ORDER — ACETAMINOPHEN 325 MG PO TABS
650.0000 mg | ORAL_TABLET | Freq: Once | ORAL | Status: AC
  Administered 2022-01-22: 650 mg via ORAL
  Filled 2022-01-22: qty 2

## 2022-01-22 NOTE — Patient Instructions (Signed)
[facility]  Discharge Instructions: Thank you for choosing [facility] to provide your oncology and hematology care.   If you have a lab appointment with the [name], please go directly to the [name] and check in at the registration area.   Wear comfortable clothing and clothing appropriate for easy access to any Portacath or PICC line.   We strive to give you quality time with your provider. You may need to reschedule your appointment if you arrive late (15 or more minutes).  Arriving late affects you and other patients whose appointments are after yours.  Also, if you miss three or more appointments without notifying the office, you may be dismissed from the clinic at the provider's discretion.      For prescription refill requests, have your pharmacy contact our office and allow 72 hours for refills to be completed.    Today you received the following chemotherapy and/or immunotherapy agents: ado-trastuzumab-emtansine (Kadcyla)      To help prevent nausea and vomiting after your treatment, we encourage you to take your nausea medication as directed.  BELOW ARE SYMPTOMS THAT SHOULD BE REPORTED IMMEDIATELY: *FEVER GREATER THAN 100.4 F (38 C) OR HIGHER *CHILLS OR SWEATING *NAUSEA AND VOMITING THAT IS NOT CONTROLLED WITH YOUR NAUSEA MEDICATION *UNUSUAL SHORTNESS OF BREATH *UNUSUAL BRUISING OR BLEEDING *URINARY PROBLEMS (pain or burning when urinating, or frequent urination) *BOWEL PROBLEMS (unusual diarrhea, constipation, pain near the anus) TENDERNESS IN MOUTH AND THROAT WITH OR WITHOUT PRESENCE OF ULCERS (sore throat, sores in mouth, or a toothache) UNUSUAL RASH, SWELLING OR PAIN  UNUSUAL VAGINAL DISCHARGE OR ITCHING   Items with * indicate a potential emergency and should be followed up as soon as possible or go to the Emergency Department if any problems should occur.  Please show the CHEMOTHERAPY ALERT CARD or IMMUNOTHERAPY  ALERT CARD at check-in to the Emergency Department and triage nurse.  Should you have questions after your visit or need to cancel or reschedule your appointment, please contact [name]  Dept: [phone]  and follow the prompts.  Office hours are 8:00 a.m. to 4:30 p.m. Monday - Friday. Please note that voicemails left after 4:00 p.m. may not be returned until the following business day.  We are closed weekends and major holidays. You have access to a nurse at all times for urgent questions. Please call the main number to the clinic Dept: [phone] and follow the prompts.   For any non-urgent questions, you may also contact your provider using MyChart. We now offer e-Visits for anyone 18 and older to request care online for non-urgent symptoms. For details visit [email or web address].   Also download the MyChart app! Go to the app store, search "MyChart", open the app, select [facility], and log in with your MyChart username and password.  Masks are optional in the cancer centers. If you would like for your care team to wear a mask while they are taking care of you, please let them know. You may have one support person who is at least 68 years old accompany you for your appointments. 

## 2022-01-22 NOTE — Progress Notes (Signed)
Pt observed for 90 minutes post first time Kadcyla infusion. Pt tolerated trtmt well w/out incident. VSS at discharge.  Ambulatory to lobby.

## 2022-01-22 NOTE — Assessment & Plan Note (Signed)
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 Neoadjuvant chemotherapy withTCHP x6completed 9/7/2023followed by Kadcyla maintenance to start 01/23/2022. 2.01/01/2022: Left lumpectomy: Grade 2 IDC 1.5 cm with intermediate grade DCIS, margins negative, lymphovascular invasion identified, 2/6 lymph nodes positive ER 90%, PR 90%, Ki-67 5%, HER2 1+ 3. Followed by adjuvant radiation therapy 4.Followed by adjuvant antiestrogen therapy -------------------------------------------------------------------------------------------------------------------------------------- Current Treatment: Kadcyla (declined COMPASS HER 2 trial) Seroma in the left axilla  RTC in 3 weeks for Kadcyla maintenance

## 2022-01-23 ENCOUNTER — Ambulatory Visit: Payer: Medicare Other | Admitting: Rehabilitation

## 2022-01-23 ENCOUNTER — Telehealth: Payer: Self-pay | Admitting: *Deleted

## 2022-01-23 NOTE — Telephone Encounter (Signed)
-----   Message from Willis Modena, RN sent at 01/22/2022  2:19 PM EDT ----- Regarding: Dr. Lindi Adie 1st time Kadcyla f/u tol well Dr. Lindi Adie 1st time Kadcyla Pt call back. Pt tolerated tx well. Pt semi-concerned about nausea.

## 2022-01-23 NOTE — Telephone Encounter (Signed)
Called patient for chemo follow up. States she did well. Is slightly tired, cancelled a rehab appt today so she could rest. Is eating and drinking without problem. No questions or concerns

## 2022-01-29 ENCOUNTER — Encounter: Payer: Self-pay | Admitting: Rehabilitation

## 2022-01-29 ENCOUNTER — Ambulatory Visit: Payer: Medicare Other | Attending: Hematology and Oncology | Admitting: Rehabilitation

## 2022-01-29 DIAGNOSIS — R293 Abnormal posture: Secondary | ICD-10-CM | POA: Diagnosis present

## 2022-01-29 DIAGNOSIS — Z483 Aftercare following surgery for neoplasm: Secondary | ICD-10-CM | POA: Diagnosis present

## 2022-01-29 DIAGNOSIS — Z170 Estrogen receptor positive status [ER+]: Secondary | ICD-10-CM | POA: Diagnosis present

## 2022-01-29 DIAGNOSIS — C50412 Malignant neoplasm of upper-outer quadrant of left female breast: Secondary | ICD-10-CM | POA: Diagnosis not present

## 2022-01-29 NOTE — Therapy (Signed)
OUTPATIENT PHYSICAL THERAPY BREAST CANCER POST OP FOLLOW UP   Patient Name: Amber Mccormick MRN: [id number] DOB:October 09, 1955, 66 y.o., female Today's Date: 01/29/2022   PT End of Session - 01/29/22 0904     Visit Number 2    Number of Visits 2    Date for PT Re-Evaluation 01/29/22    PT Start Time 0901    PT Stop Time 0932    PT Time Calculation (min) 31 min    Activity Tolerance Patient tolerated treatment well    Behavior During Therapy [facility] for tasks assessed/performed             Past Medical History:  Diagnosis Date   Anxiety    Cancer ([place]) 05/2021   right breast IDC, + lymph node   Hyperlipidemia    Hypertension    no meds now, BP has been good   Hypothyroidism    Past Surgical History:  Procedure Laterality Date   BREAST LUMPECTOMY WITH RADIOACTIVE SEED AND SENTINEL LYMPH NODE BIOPSY Left 01/01/2022   Procedure: LEFT BREAST LUMPECTOMY WITH RADIOACTIVE SEED AND SENTINEL LYMPH NODE BIOPSY;  Surgeon: Erroll Luna, MD;  Location: [place];  Service: General;  Laterality: Left;   MOHS SURGERY     on nose   PORTACATH PLACEMENT Right 08/21/2021   Procedure: INSERTION PORT-A-CATH;  Surgeon: Erroll Luna, MD;  Location: [place];  Service: General;  Laterality: Right;   RADIOACTIVE SEED GUIDED AXILLARY SENTINEL LYMPH NODE Left 01/01/2022   Procedure: RADIOACTIVE SEED GUIDED LEFT AXILLARY SENTINEL LYMPH NODE BIOPSY;  Surgeon: Erroll Luna, MD;  Location: [place];  Service: General;  Laterality: Left;   TUBAL LIGATION     Patient Active Problem List   Diagnosis Date Noted   Port-A-Cath in place 08/22/2021   Malignant neoplasm of upper-outer quadrant of left breast, estrogen receptor positive ([place]) 08/08/2021   Mixed hyperlipidemia 05/21/2020   Anxiety 05/21/2020   Acquired hypothyroidism 05/21/2020    PCP: Linward Natal PA-C  REFERRING PROVIDER: Shona Simpson PA-C  REFERRING DIAG: Left breast cancer    THERAPY DIAG:  Malignant neoplasm of upper-outer quadrant of left breast in female, estrogen receptor positive ([place])  Abnormal posture  Rationale for Evaluation and Treatment: Rehabilitation  ONSET DATE: 07/29/2021  SUBJECTIVE:                                                                                                                                                                                           SUBJECTIVE STATEMENT: She reports she is not in any pain. She feels like her range of motion is back to where it  was prior to surgery. She is done with the chemo therapy and she will start her 6 weeks of radiation after she speaks with her doctor next week.  She had a seroma drained in October   PERTINENT HISTORY:  Biopsy of the breast and the lymph node were positive for grade 2 invasive ductal carcinoma with DCIS that was ER/PR positive HER2 positive with a Ki-67 of 20%.  The lymph node biopsy was also positive. Will be having neoadjuvant chemotherapy and then lumpectomy and targeted ax dissection.  Sx date unknown.     PATIENT GOALS:  Reassess how my recovery is going related to arm function, pain, and swelling.  PAIN:  Are you having pain? No  PRECAUTIONS: Recent Surgery, left UE Lymphedema risk, None  ACTIVITY LEVEL / LEISURE: Walking    OBJECTIVE:   PATIENT SURVEYS:  QUICK DASH: 11.36%  OBSERVATIONS: Incision site is healing as expected   POSTURE:  Forward head posture   LYMPHEDEMA ASSESSMENT:   UPPER EXTREMITY AROM/PROM:   A/PROM LEFT   eval 01/29/2022  Shoulder extension 65 63  Shoulder flexion 157 157  Shoulder abduction 157 170  Shoulder internal rotation     Shoulder external rotation 90 90                          (Blank rows = not tested)   CERVICAL AROM: All within normal limits:    LYMPHEDEMA ASSESSMENTS:  LANDMARK RIGHT   eval 01/29/2022  10 cm proximal to olecranon process 30.5 28.5  Olecranon process 25._0 cm proximal to ulnar  styloid process 20.2 18.5  Just proximal to ulnar styloid process 18.5 15  Across hand at thumb web space 19 18  At base of 2nd digit 6.2 6.2  (Blank rows = not tested)   LANDMARK LEFT   eval 01/29/2022  10 cm proximal to olecranon process 30.5 28.5  Olecranon process _1 cm proximal to ulnar styloid process 20.3 18.5  Just proximal to ulnar styloid process 18.5 15  Across hand at thumb web space 19 18  At base of 2nd digit 6.5 6  (Blank rows = not tested)  Surgery type/Date: lumpectomy 01/01/2022 Number of lymph nodes removed: 6 removed 2 positive  Current/past treatment (chemo, radiation, hormone therapy): neoadjuvant chemotherapy, radiation  Other symptoms:  Heaviness/tightness No Pain No  PATIENT EDUCATION:  Education details: ABC class,Scar massage, compression garment, HEP, walking program, posture  Person educated: Patient Education method: Explanation, Demonstration, and Handouts Education comprehension: verbalized understanding  HOME EXERCISE PROGRAM: Reviewed previously given post op HEP.   ASSESSMENT:  CLINICAL IMPRESSION: Pt present to clinic post lumpectomy and lymph nodes removed. Patient has regained ROM from prior to surgery and no noticeable swelling in her arm. Patient was educated on the ABC class, scar massage, compression garment wearing, continuing her HEP and walking program. Pt will follow up in 3 months for her SOZO screening and was educated that if she needs any help with anything or notice changes in her progress she is welcomed to come back.  Pt will benefit from skilled therapeutic intervention to improve on the following deficits: Decreased knowledge of precautions, impaired UE functional use, pain, decreased ROM, postural dysfunction.   PT treatment/interventions: ADL/Self care home management, Therapeutic exercises, Therapeutic activity, Neuromuscular re-education, Balance training, Gait training, Patient/Family education, Self Care, Joint  mobilization, Dry Needling, Moist heat, Compression bandaging, scar mobilization, Taping, and Manual therapy  GOALS: Goals reviewed with patient? Yes  LONG TERM GOALS:  (STG=LTG)  GOALS Name Target Date  Goal status  1 Pt will demonstrate she has regained full shoulder ROM and function post operatively compared to baselines.  Baseline: 01/29/2022 MET     PLAN:  PT FREQUENCY/DURATION: SOZO screening  PLAN FOR NEXT SESSION: SOZO screening    Cesareo Vickrey, Student-PT 01/29/2022  9:36 AM

## 2022-01-29 NOTE — Patient Instructions (Signed)
[facility]  [address], [address]  [place] [address]  [phone]  After Breast Cancer Class It is recommended you attend the ABC class to be educated on lymphedema risk reduction. This class is free of charge and lasts for 1 hour. It is a 1-time class. You will need to download the Webex app either on your phone or computer. We will send you a link the night before or the morning of the class. You should be able to click on that link to join the class. This is not a confidential class. You don't have to turn your camera on, but other participants may be able to see your email address.  Scar massage You can begin gentle scar massage to you incision sites. Gently place one hand on the incision and move the skin (without sliding on the skin) in various directions. Do this for a few minutes and then you can gently massage either coconut oil or vitamin E cream into the scars.  Compression garment You should continue wearing your compression bra until you feel like you no longer have swelling.  Home exercise Program Continue doing the exercises you were given until you feel like you can do them without feeling any tightness at the end.   Walking Program Studies show that 30 minutes of walking per day (fast enough to elevate your heart rate) can significantly reduce the risk of a cancer recurrence. If you can't walk due to other medical reasons, we encourage you to find another activity you could do (like a stationary bike or water exercise).  Posture After breast cancer surgery, people frequently sit with rounded shoulders posture because it puts their incisions on slack and feels better. If you sit like this and scar tissue forms in that position, you can become very tight and have pain sitting or standing with good posture. Try to be aware of your posture and sit and stand up tall to heal properly.  Follow up PT: It is recommended you return every 3 months for the  first 3 years following surgery to be assessed on the SOZO machine for an L-Dex score. This helps prevent clinically significant lymphedema in 95% of patients. These follow up screens are 10 minute appointments that you are not billed for. 

## 2022-01-30 ENCOUNTER — Other Ambulatory Visit: Payer: Self-pay

## 2022-02-03 NOTE — Progress Notes (Signed)
Radiation Oncology         [phone] ________________________________  Outpatient Follow Up - Conducted via telephone at patient request.  I spoke with the patient to conduct this consult visit via telephone. The patient was notified in advance and was offered an in person or telemedicine meeting to allow for face to face communication but instead preferred to proceed with a telephone visit.  Name: Amber Mccormick        MRN: [id number]  Date of Service: 02/05/2022 DOB: Sep 06, 1955  QA:[id number], Truddie Crumble, PA-C  Nicholas Lose, MD     REFERRING PHYSICIAN: Nicholas Lose, MD   DIAGNOSIS: The encounter diagnosis was Malignant neoplasm of upper-outer quadrant of left breast in female, estrogen receptor positive ([place]).   HISTORY OF PRESENT ILLNESS: Amber Mccormick is a 66 y.o. female with a diangosis ofleft breast cancer.  The patient was found to have a palpable mass in her left breast and was seen by her PCP.  Diagnostic work-up included ultrasound as well and showed a  2 cm mass in the 2:30 position of the left breast, she also had a single left axillary lymph node that was suspicious, she underwent a biopsy at an outside facility of the breast and lymph node.  The breast biopsy showed grade 2 invasive ductal carcinoma, and metastatic carcinoma was seen in the axillary lymph node.  Her cancer was ER/PR positive, HER2 was amplified.    Since her last visit, an MRI of the breast on 08/16/2021 showed known disease in the left breast and a single enlarged axillary lymph node with associated biopsy clip.  The right breast was negative for disease.  She went on to receive neoadjuvant chemotherapy between 08/23/2021 and 12/05/2021 and has continued with anti-HER2 therapy.  Posttreatment MRI on 12/09/2021 showed treatment response with reduction in the size of the known left breast cancer that had improved to 1.6 cm.  The malignant left axillary node was now normal in appearance and no evidence of any new  disease was appreciated.  She underwent left lumpectomy with sentinel lymph node biopsy on 01/01/2022, final pathology showed grade 2 invasive ductal carcinoma with micropapillary features measuring 1.5 cm with associated intermediate grade DCIS with calcifications.  Her margins were negative with the closest being the posterior margin at 2 mm.  LVI was noted.  2 of 6 lymph nodes submitted were positive for metastatic disease.  She is contacted by phone today to discuss treatment recommendations with radiotherapy.    PREVIOUS RADIATION THERAPY: No   PAST MEDICAL HISTORY:  Past Medical History:  Diagnosis Date   Anxiety    Cancer ([place]) 05/2021   right breast IDC, + lymph node   Hyperlipidemia    Hypertension    no meds now, BP has been good   Hypothyroidism        PAST SURGICAL HISTORY: Past Surgical History:  Procedure Laterality Date   BREAST LUMPECTOMY WITH RADIOACTIVE SEED AND SENTINEL LYMPH NODE BIOPSY Left 01/01/2022   Procedure: LEFT BREAST LUMPECTOMY WITH RADIOACTIVE SEED AND SENTINEL LYMPH NODE BIOPSY;  Surgeon: Erroll Luna, MD;  Location: [place];  Service: General;  Laterality: Left;   MOHS SURGERY     on nose   PORTACATH PLACEMENT Right 08/21/2021   Procedure: INSERTION PORT-A-CATH;  Surgeon: Erroll Luna, MD;  Location: [place];  Service: General;  Laterality: Right;   RADIOACTIVE SEED GUIDED AXILLARY SENTINEL LYMPH NODE Left 01/01/2022   Procedure: RADIOACTIVE SEED GUIDED LEFT AXILLARY SENTINEL  LYMPH NODE BIOPSY;  Surgeon: Erroll Luna, MD;  Location: [place];  Service: General;  Laterality: Left;   TUBAL LIGATION       FAMILY HISTORY:  Family History  Problem Relation Age of Onset   Pancreatic cancer Father    Bone cancer Maternal Uncle      SOCIAL HISTORY:  reports that she has never smoked. She has never used smokeless tobacco. She reports that she does not drink alcohol and does not use drugs. The  patient is married and lives in [place]. She is accompanied by her husband.    ALLERGIES: Patient has no known allergies.   MEDICATIONS:  Current Outpatient Medications  Medication Sig Dispense Refill   amitriptyline (ELAVIL) 10 MG tablet Take 10 mg by mouth at bedtime.     amLODipine (NORVASC) 5 MG tablet Take 5 mg by mouth daily.     atorvastatin (LIPITOR) 10 MG tablet Take 10 mg by mouth daily.     cholecalciferol (VITAMIN D3) 25 MCG (1000 UNIT) tablet Take 1,000 Units by mouth daily.     diphenhydrAMINE (BENADRYL) 25 MG tablet Take 25 mg by mouth every 6 (six) hours as needed.     diphenoxylate-atropine (LOMOTIL) 2.5-0.025 MG tablet Take 1 tablet by mouth 4 (four) times daily as needed for diarrhea or loose stools. 30 tablet 3   ibuprofen (ADVIL) 800 MG tablet Take 1 tablet (800 mg total) by mouth every 8 (eight) hours as needed. 30 tablet 0   ibuprofen (ADVIL) 800 MG tablet Take 1 tablet (800 mg total) by mouth every 8 (eight) hours as needed. 30 tablet 0   levothyroxine (SYNTHROID) 50 MCG tablet Take 50 mcg by mouth daily.     metroNIDAZOLE (METROCREAM) 0.75 % cream Apply topically 2 (two) times daily as needed.     oxyCODONE (OXY IR/ROXICODONE) 5 MG immediate release tablet Take 1 tablet (5 mg total) by mouth every 6 (six) hours as needed for severe pain. 15 tablet 0   vitamin B-12 (CYANOCOBALAMIN) 500 MCG tablet Take by mouth.     vitamin C (ASCORBIC ACID) 500 MG tablet Take 500 mg by mouth daily.     No current facility-administered medications for this visit.     REVIEW OF SYSTEMS: On review of systems, the patient reports that she is doing ***     PHYSICAL EXAM:   Unable to assess due to encounter type.    ECOG = ***  0 - Asymptomatic (Fully active, able to carry on all predisease activities without restriction)  1 - Symptomatic but completely ambulatory (Restricted in physically strenuous activity but ambulatory and able to carry out work of a light or  sedentary nature. For example, light housework, office work)  2 - Symptomatic, <50% in bed during the day (Ambulatory and capable of all self care but unable to carry out any work activities. Up and about more than 50% of waking hours)  3 - Symptomatic, >50% in bed, but not bedbound (Capable of only limited self-care, confined to bed or chair 50% or more of waking hours)  4 - Bedbound (Completely disabled. Cannot carry on any self-care. Totally confined to bed or chair)  5 - Death   Eustace Pen MM, Creech RH, Tormey DC, et al. [phone]). "Toxicity and response criteria of the [facility]". [place] Oncol. 5 (6): 649-55    LABORATORY DATA:  Lab Results  Component Value Date   WBC 5.6 01/22/2022   HGB 12.4 01/22/2022  HCT 36.7 01/22/2022   MCV 92.7 01/22/2022   PLT 182 01/22/2022   Lab Results  Component Value Date   NA 139 01/22/2022   K 3.8 01/22/2022   CL 106 01/22/2022   CO2 29 01/22/2022   Lab Results  Component Value Date   ALT 16 01/22/2022   AST 20 01/22/2022   ALKPHOS 113 01/22/2022   BILITOT 0.4 01/22/2022      RADIOGRAPHY: No results found.     IMPRESSION/PLAN: 1. 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  We reviewed her course to date and the benefits her neoadjuvant therapy gave her.  Dr. Lisbeth Renshaw has also reviewed her final pathology findings and I reviewed the nature of the node positive breast disease.  She has done well with her treatment to date, Dr. Lisbeth Renshaw recommends external radiotherapy to the breast and regional lymph nodes to reduce risks of local recurrence followed by antiestrogen therapy. We discussed the risks, benefits, short, and long term effects of radiotherapy, as well as the curative intent, and the patient is interested in proceeding.  I reviewed the delivery and logistics of radiotherapy and that Dr. Lisbeth Renshaw recommends 6 1/2 weeks of radiotherapy to the left breast and regional  nodes with deep inspiration breath hold technique.  She will simulate tomorrow at which time she will sign written consent to proceed.     This encounter was conducted via telephone.  The patient has provided two factor identification and has given verbal consent for this type of encounter and has been advised to only accept a meeting of this type in a secure network environment. The time spent during this encounter was *** minutes including preparation, discussion, and coordination of the patient's care. The attendants for this meeting include Hayden Pedro  and Sima Matas.  During the encounter,    Hayden Pedro was located at [facility] Radiation Oncology Department.  Amber Mccormick was located at home.      Carola Rhine, [facility]    **Disclaimer: This note was dictated with voice recognition software. Similar sounding words can inadvertently be transcribed and this note may contain transcription errors which may not have been corrected upon publication of note.**

## 2022-02-05 ENCOUNTER — Ambulatory Visit
Admission: RE | Admit: 2022-02-05 | Discharge: 2022-02-05 | Disposition: A | Discharge status: Home / Self Care | Payer: Medicare Other | Source: Ambulatory Visit | Attending: Radiation Oncology | Admitting: Radiation Oncology

## 2022-02-05 ENCOUNTER — Encounter: Payer: Self-pay | Admitting: Radiation Oncology

## 2022-02-05 VITALS — BP 165/81 | HR 89 | Temp 97.2°F | Resp 18 | Ht 66.0 in | Wt 152.2 lb

## 2022-02-05 DIAGNOSIS — E039 Hypothyroidism, unspecified: Secondary | ICD-10-CM | POA: Diagnosis not present

## 2022-02-05 DIAGNOSIS — C50412 Malignant neoplasm of upper-outer quadrant of left female breast: Secondary | ICD-10-CM | POA: Insufficient documentation

## 2022-02-05 DIAGNOSIS — Z79890 Hormone replacement therapy (postmenopausal): Secondary | ICD-10-CM | POA: Diagnosis not present

## 2022-02-05 DIAGNOSIS — Z800 Family history of malignant neoplasm of digestive organs: Secondary | ICD-10-CM | POA: Insufficient documentation

## 2022-02-05 DIAGNOSIS — E785 Hyperlipidemia, unspecified: Secondary | ICD-10-CM | POA: Diagnosis not present

## 2022-02-05 DIAGNOSIS — Z170 Estrogen receptor positive status [ER+]: Secondary | ICD-10-CM | POA: Insufficient documentation

## 2022-02-05 DIAGNOSIS — Z79899 Other long term (current) drug therapy: Secondary | ICD-10-CM | POA: Insufficient documentation

## 2022-02-05 DIAGNOSIS — Z510 Encounter for antineoplastic radiation therapy: Secondary | ICD-10-CM | POA: Insufficient documentation

## 2022-02-05 DIAGNOSIS — Z808 Family history of malignant neoplasm of other organs or systems: Secondary | ICD-10-CM | POA: Diagnosis not present

## 2022-02-05 DIAGNOSIS — I10 Essential (primary) hypertension: Secondary | ICD-10-CM | POA: Insufficient documentation

## 2022-02-05 NOTE — Progress Notes (Signed)
Follow-up-new appointment for 66yrold female w/ Malignant neoplasm of upper-outer quadrant of left breast in female, estrogen receptor positive ([place].  I verified patient's identity and began nursing interview. Patient states "Doing well." No issues reported at this time.  Meaningful use complete. NO chances of pregnancy.  BP (!) 165/81 (BP Location: Left Arm, Patient Position: Sitting, Cuff Size: Normal)   Pulse 89   Temp (!) 97.2 F (36.2 C) (Temporal)   Resp 18   Ht '5\' 6"'$  (1.676 m)   Wt 152 lb 4 oz (69.1 kg)   SpO2 97%   BMI 24.57 kg/m   This concludes the nursing interview.  MLeandra Kern LPN

## 2022-02-06 ENCOUNTER — Ambulatory Visit: Payer: Medicare Other

## 2022-02-06 ENCOUNTER — Ambulatory Visit: Payer: Medicare Other | Admitting: Hematology and Oncology

## 2022-02-09 NOTE — Progress Notes (Signed)
Patient Care Team: Kathreen Devoid, PA-C as PCP - Philomena Doheny, Paulette Blanch, RN as Oncology Nurse Navigator Rockwell Germany, RN as Oncology Nurse Navigator Nicholas Lose, MD as Consulting Physician (Hematology and Oncology) Minus Breeding, MD as Consulting Physician (Cardiology) Paralee Cancel, MD as Consulting Physician (Orthopedic Surgery)  DIAGNOSIS: No diagnosis found.  SUMMARY OF ONCOLOGIC HISTORY: Oncology History  Malignant neoplasm of upper-outer quadrant of left breast, estrogen receptor positive ([place])  07/01/2021 Initial Diagnosis   Screening mammogram 06/27/2021 detected abnormality in the left breast which led to diagnostic mammogram and left breast ultrasound that revealed hypoechoic mass with irregular borders 2:30 position 2 cm left axilla: 1 abnormal lymph node, grade 2 IDC with DCIS ER 99%, PR 95%, HER2 2+ by IHC, FISH positive, Ki-67 20%, left axilla lymph node: Positive   08/08/2021 Cancer Staging   Staging form: Breast, AJCC 8th Edition - Clinical stage from 08/08/2021: Stage IB (cT1c, cN1(f), cM0, G2, ER+, PR+, HER2+) - Signed by Nicholas Lose, MD on 08/08/2021 Stage prefix: Initial diagnosis Method of lymph node assessment: Core biopsy Histologic grading system: 3 grade system   08/14/2021 - 12/26/2021 Chemotherapy   Patient is on Treatment Plan : BREAST  Docetaxel + Carboplatin + Trastuzumab + Pertuzumab  (TCHP) q21d / Trastuzumab + Pertuzumab q21d     08/22/2021 - 11/14/2021 Chemotherapy   Patient is on Treatment Plan : BREAST  Docetaxel + Carboplatin + Trastuzumab + Pertuzumab  (TCHP) q21d      01/22/2022 -  Chemotherapy   Patient is on Treatment Plan : BREAST ADO-Trastuzumab Emtansine (Kadcyla) q21d       CHIEF COMPLIANT: Follow-up left breast on Kadcyla   INTERVAL HISTORY: Amber Mccormick is a 66 y.o with the above-mentioned left breast cancer currently on kadcyla maintenance. She presents to the clinic for a follow-up.    ALLERGIES:  has No Known  Allergies.  MEDICATIONS:  Current Outpatient Medications  Medication Sig Dispense Refill   amitriptyline (ELAVIL) 10 MG tablet Take 10 mg by mouth at bedtime.     amLODipine (NORVASC) 5 MG tablet Take 5 mg by mouth daily.     atorvastatin (LIPITOR) 10 MG tablet Take 10 mg by mouth daily.     cholecalciferol (VITAMIN D3) 25 MCG (1000 UNIT) tablet Take 1,000 Units by mouth daily.     diphenhydrAMINE (BENADRYL) 25 MG tablet Take 25 mg by mouth every 6 (six) hours as needed.     diphenoxylate-atropine (LOMOTIL) 2.5-0.025 MG tablet Take 1 tablet by mouth 4 (four) times daily as needed for diarrhea or loose stools. 30 tablet 3   ibuprofen (ADVIL) 800 MG tablet Take 1 tablet (800 mg total) by mouth every 8 (eight) hours as needed. 30 tablet 0   ibuprofen (ADVIL) 800 MG tablet Take 1 tablet (800 mg total) by mouth every 8 (eight) hours as needed. 30 tablet 0   levothyroxine (SYNTHROID) 50 MCG tablet Take 50 mcg by mouth daily.     metroNIDAZOLE (METROCREAM) 0.75 % cream Apply topically 2 (two) times daily as needed.     oxyCODONE (OXY IR/ROXICODONE) 5 MG immediate release tablet Take 1 tablet (5 mg total) by mouth every 6 (six) hours as needed for severe pain. 15 tablet 0   vitamin B-12 (CYANOCOBALAMIN) 500 MCG tablet Take by mouth.     vitamin C (ASCORBIC ACID) 500 MG tablet Take 500 mg by mouth daily.     No current facility-administered medications for this visit.    PHYSICAL EXAMINATION: ECOG  PERFORMANCE STATUS: {CHL ONC ECOG PS:[phone]}  There were no vitals filed for this visit. There were no vitals filed for this visit.  BREAST:*** No palpable masses or nodules in either right or left breasts. No palpable axillary supraclavicular or infraclavicular adenopathy no breast tenderness or nipple discharge. (exam performed in the presence of a chaperone)  LABORATORY DATA:  I have reviewed the data as listed    Latest Ref Rng & Units 01/22/2022    8:01 AM 01/16/2022    9:47 AM 12/26/2021    10:44 AM  CMP  Glucose 70 - 99 mg/dL 90  97  86   BUN 8 - 23 mg/dL _0 Creatinine 0.44 - 1.00 mg/dL 0.66  0.59  0.55   Sodium 135 - 145 mmol/L 139  140  140   Potassium 3.5 - 5.1 mmol/L 3.8  4.1  4.2   Chloride 98 - 111 mmol/L 106  107  103   CO2 22 - 32 mmol/L _1 Calcium 8.9 - 10.3 mg/dL 9.0  9.0  8.9   Total Protein 6.5 - 8.1 g/dL 6.5  6.2  6.7   Total Bilirubin 0.3 - 1.2 mg/dL 0.4  0.4  0.3   Alkaline Phos 38 - 126 U/L 113  103  111   AST 15 - 41 U/L _2 ALT 0 - 44 U/L _3 Lab Results  Component Value Date   WBC 5.6 01/22/2022   HGB 12.4 01/22/2022   HCT 36.7 01/22/2022   MCV 92.7 01/22/2022   PLT 182 01/22/2022   NEUTROABS 2.7 01/22/2022    ASSESSMENT & PLAN:  No problem-specific Assessment & Plan notes found for this encounter.    No orders of the defined types were placed in this encounter.  The patient has a good understanding of the overall plan. she agrees with it. she will call with any problems that may develop before the next visit here. Total time spent: 30 mins including face to face time and time spent for planning, charting and co-ordination of care   Suzzette Righter, [place] 02/09/22    I Gardiner Coins am scribing for Dr. Lindi Adie  ***

## 2022-02-11 NOTE — Progress Notes (Deleted)
[address]       [place] [address]             [phone]        Amber Mccormick [id number] [birth year]   History of Present Illness:  Amber Mccormick is a 66 yo female with history of Anxiety, Hyperlipidemia, Hypertension, Hypothyroidism, and Brease Cancer.  She was noted ot have a 4.0 cm Ascending Aortic Aneurysm which appeared stable on MRI for her breast cancer workup.  She presents to the office for surgical surveillance.       Current Outpatient Medications on File Prior to Visit  Medication Sig Dispense Refill   amitriptyline (ELAVIL) 10 MG tablet Take 10 mg by mouth at bedtime.     amLODipine (NORVASC) 5 MG tablet Take 5 mg by mouth daily.     atorvastatin (LIPITOR) 10 MG tablet Take 10 mg by mouth daily.     cholecalciferol (VITAMIN D3) 25 MCG (1000 UNIT) tablet Take 1,000 Units by mouth daily.     diphenhydrAMINE (BENADRYL) 25 MG tablet Take 25 mg by mouth every 6 (six) hours as needed.     diphenoxylate-atropine (LOMOTIL) 2.5-0.025 MG tablet Take 1 tablet by mouth 4 (four) times daily as needed for diarrhea or loose stools. 30 tablet 3   ibuprofen (ADVIL) 800 MG tablet Take 1 tablet (800 mg total) by mouth every 8 (eight) hours as needed. 30 tablet 0   ibuprofen (ADVIL) 800 MG tablet Take 1 tablet (800 mg total) by mouth every 8 (eight) hours as needed. 30 tablet 0   levothyroxine (SYNTHROID) 50 MCG tablet Take 50 mcg by mouth daily.     metroNIDAZOLE (METROCREAM) 0.75 % cream Apply topically 2 (two) times daily as needed.     oxyCODONE (OXY IR/ROXICODONE) 5 MG immediate release tablet Take 1 tablet (5 mg total) by mouth every 6 (six) hours as needed for severe pain. 15 tablet 0   vitamin B-12 (CYANOCOBALAMIN) 500 MCG tablet Take by mouth.     vitamin C (ASCORBIC ACID) 500 MG tablet Take 500 mg by mouth daily.     [DISCONTINUED] prochlorperazine (COMPAZINE) 10 MG tablet Take 1 tablet (10 mg total) by mouth every 6 (six) hours as needed (Nausea or  vomiting). 30 tablet 1   No current facility-administered medications on file prior to visit.     There were no vitals taken for this visit.  Physical Exam    CTA Results:  FINDINGS: Breast composition: b. Scattered fibroglandular tissue.   Background parenchymal enhancement: Minimal   Right breast: No mass or abnormal enhancement.   Left breast: The biopsy-proven malignancy (containing biopsy clip artifact) within the posterior OUTER LEFT breast is less confluent and now measures 1.6 x 1.4 x 1.4 cm (AP x transverse x CC) , previously 2.2 x 2.1 x 2.2 cm. Mild tenting of the LEFT pectoralis muscle is again noted without evidence of muscle enhancement.   No other significant abnormalities are identified within the LEFT breast.   Lymph nodes: No abnormal appearing lymph nodes are noted. Biopsy clip artifact within normal sized LEFT axillary lymph node noted.   Ancillary findings: Ascending thoracic aortic aneurysm measuring 4 cm is unchanged. Port-A-Cath in the UPPER RIGHT chest is now identified.   IMPRESSION: 1. Treatment response with decreased size of known OUTER LEFT breast malignancy, now measuring 1.6 x 1.4 x 1.4 cm, previously 2.2 x 2.1 x 2.2 cm. Biopsy-proven malignant LEFT axillary lymph node is now normal  in appearance. 2. No evidence of RIGHT breast malignancy or metastatic disease. 3. Unchanged 4 cm ascending thoracic aortic aneurysm. Recommend annual imaging followup by CTA or MRA. This recommendation follows 2010 ACCF/AHA/AATS/ACR/ASA/SCA/SCAI/SIR/STS/SVM Guidelines for the Diagnosis and Management of Patients with Thoracic Aortic Disease. Circulation. 2010; 121: N998-X215. Aortic aneurysm NOS (ICD10-I71.9)   RECOMMENDATION: Treatment plan   BI-RADS CATEGORY  6: Known biopsy-proven malignancy.     Electronically Signed   By: Amber Mccormick M.D.   On: 12/10/2021 08:02    A/P:  Ascending Aortic Aneurysm measuring 4.0 cm-  aneurysm is small and  does not require surgical intervention at this time.  Per Cardiology guidelines she will require repeat CTA chest in 1 year HTN- on Norvasc Hyperlipidemia- continue Lipitor Hypothyroidism- on synthroid    Risk Modification:  Statin:  Yes  Smoking cessation instruction/counseling given:  {CHL AMB [facility] SMOKING CESSATION COUNSELING:20758}  Patient was counseled on importance of Blood Pressure Control.  Despite Medical intervention if the patient notices persistently elevated blood pressure readings.  They are instructed to contact their Primary Care Physician  Please avoid use of Fluoroquinolones as this can potentially increase your risk of Aortic Rupture and/or Dissection  Patient educated on signs and symptoms of Aortic Dissection, handout also provided in AVS  Amber Smyth, PA-C 02/11/22

## 2022-02-11 NOTE — Assessment & Plan Note (Signed)
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 Neoadjuvant chemotherapy with TCHP x6 completed 12/05/2021 followed by Kadcyla maintenance to start 01/23/2022. 2. 01/01/2022: Left lumpectomy: Grade 2 IDC 1.5 cm with intermediate grade DCIS, margins negative, lymphovascular invasion identified, 2/6 lymph nodes positive ER 90%, PR 90%, Ki-67 5%, HER2 1+ 3. Followed by adjuvant radiation therapy 4.  Followed by adjuvant antiestrogen therapy -------------------------------------------------------------------------------------------------------------------------------------- Current Treatment: Kadcyla (declined COMPASS HER 2 trial) Labs reviewed: Normal  RTC in 3 weeks for Kadcyla maintenance

## 2022-02-11 NOTE — Patient Instructions (Incomplete)
Make every effort to maintain a "heart-healthy" lifestyle with regular physical exercise and adherence to a low-fat, low-carbohydrate diet.  Continue to seek regular follow-up appointments with your primary care physician and/or cardiologist.   AVOID FLOUROQUINOLONES AS THESE CAN RAISE YOUR RISK OF AORTIC DISSECTION

## 2022-02-12 ENCOUNTER — Inpatient Hospital Stay (HOSPITAL_BASED_OUTPATIENT_CLINIC_OR_DEPARTMENT_OTHER): Payer: Medicare Other | Admitting: Hematology and Oncology

## 2022-02-12 ENCOUNTER — Inpatient Hospital Stay: Payer: Medicare Other | Attending: Hematology and Oncology

## 2022-02-12 ENCOUNTER — Ambulatory Visit: Payer: Medicare Other | Admitting: Radiation Oncology

## 2022-02-12 ENCOUNTER — Other Ambulatory Visit: Payer: Self-pay | Admitting: *Deleted

## 2022-02-12 ENCOUNTER — Inpatient Hospital Stay: Payer: Medicare Other

## 2022-02-12 ENCOUNTER — Other Ambulatory Visit: Payer: Self-pay

## 2022-02-12 VITALS — BP 133/72 | HR 87 | Temp 97.7°F | Resp 18 | Ht 66.0 in | Wt 151.3 lb

## 2022-02-12 DIAGNOSIS — Z95828 Presence of other vascular implants and grafts: Secondary | ICD-10-CM

## 2022-02-12 DIAGNOSIS — Z5112 Encounter for antineoplastic immunotherapy: Secondary | ICD-10-CM | POA: Insufficient documentation

## 2022-02-12 DIAGNOSIS — Z170 Estrogen receptor positive status [ER+]: Secondary | ICD-10-CM

## 2022-02-12 DIAGNOSIS — I427 Cardiomyopathy due to drug and external agent: Secondary | ICD-10-CM

## 2022-02-12 DIAGNOSIS — C50412 Malignant neoplasm of upper-outer quadrant of left female breast: Secondary | ICD-10-CM

## 2022-02-12 DIAGNOSIS — Z510 Encounter for antineoplastic radiation therapy: Secondary | ICD-10-CM | POA: Diagnosis not present

## 2022-02-12 DIAGNOSIS — Z79899 Other long term (current) drug therapy: Secondary | ICD-10-CM | POA: Insufficient documentation

## 2022-02-12 LAB — CBC WITH DIFFERENTIAL (CANCER CENTER ONLY)
Abs Immature Granulocytes: 0.01 10*3/uL (ref 0.00–0.07)
Basophils Absolute: 0 10*3/uL (ref 0.0–0.1)
Basophils Relative: 0 %
Eosinophils Absolute: 0.2 10*3/uL (ref 0.0–0.5)
Eosinophils Relative: 4 %
HCT: 37.5 % (ref 36.0–46.0)
Hemoglobin: 12.5 g/dL (ref 12.0–15.0)
Immature Granulocytes: 0 %
Lymphocytes Relative: 32 %
Lymphs Abs: 1.8 10*3/uL (ref 0.7–4.0)
MCH: 30.3 pg (ref 26.0–34.0)
MCHC: 33.3 g/dL (ref 30.0–36.0)
MCV: 90.8 fL (ref 80.0–100.0)
Monocytes Absolute: 0.5 10*3/uL (ref 0.1–1.0)
Monocytes Relative: 9 %
Neutro Abs: 3 10*3/uL (ref 1.7–7.7)
Neutrophils Relative %: 55 %
Platelet Count: 183 10*3/uL (ref 150–400)
RBC: 4.13 MIL/uL (ref 3.87–5.11)
RDW: 13.6 % (ref 11.5–15.5)
WBC Count: 5.5 10*3/uL (ref 4.0–10.5)
nRBC: 0 % (ref 0.0–0.2)

## 2022-02-12 LAB — CMP (CANCER CENTER ONLY)
ALT: 16 U/L (ref 0–44)
AST: 23 U/L (ref 15–41)
Albumin: 3.9 g/dL (ref 3.5–5.0)
Alkaline Phosphatase: 108 U/L (ref 38–126)
Anion gap: 5 (ref 5–15)
BUN: 13 mg/dL (ref 8–23)
CO2: 31 mmol/L (ref 22–32)
Calcium: 9.2 mg/dL (ref 8.9–10.3)
Chloride: 104 mmol/L (ref 98–111)
Creatinine: 0.69 mg/dL (ref 0.44–1.00)
GFR, Estimated: >60 mL/min (ref >60)
Glucose, Bld: 126 mg/dL — ABNORMAL HIGH (ref 70–99)
Potassium: 3.7 mmol/L (ref 3.5–5.1)
Sodium: 140 mmol/L (ref 135–145)
Total Bilirubin: 0.3 mg/dL (ref 0.3–1.2)
Total Protein: 7 g/dL (ref 6.5–8.1)

## 2022-02-12 MED ORDER — DIPHENHYDRAMINE HCL 25 MG PO CAPS
25.0000 mg | ORAL_CAPSULE | Freq: Once | ORAL | Status: AC
  Administered 2022-02-12: 25 mg via ORAL
  Filled 2022-02-12: qty 1

## 2022-02-12 MED ORDER — SODIUM CHLORIDE 0.9 % IV SOLN
3.6000 mg/kg | Freq: Once | INTRAVENOUS | Status: AC
  Administered 2022-02-12: 260 mg via INTRAVENOUS
  Filled 2022-02-12: qty 8

## 2022-02-12 MED ORDER — PROCHLORPERAZINE MALEATE 10 MG PO TABS
10.0000 mg | ORAL_TABLET | Freq: Once | ORAL | Status: AC
  Administered 2022-02-12: 10 mg via ORAL
  Filled 2022-02-12: qty 1

## 2022-02-12 MED ORDER — ACETAMINOPHEN 325 MG PO TABS
650.0000 mg | ORAL_TABLET | Freq: Once | ORAL | Status: AC
  Administered 2022-02-12: 650 mg via ORAL
  Filled 2022-02-12: qty 2

## 2022-02-12 MED ORDER — HEPARIN SOD (PORK) LOCK FLUSH 100 UNIT/ML IV SOLN
500.0000 [IU] | Freq: Once | INTRAVENOUS | Status: AC | PRN
  Administered 2022-02-12: 500 [IU]

## 2022-02-12 MED ORDER — SODIUM CHLORIDE 0.9% FLUSH
10.0000 mL | INTRAVENOUS | Status: DC | PRN
  Administered 2022-02-12: 10 mL

## 2022-02-12 MED ORDER — SODIUM CHLORIDE 0.9% FLUSH
10.0000 mL | Freq: Once | INTRAVENOUS | Status: AC
  Administered 2022-02-12: 10 mL

## 2022-02-12 MED ORDER — SODIUM CHLORIDE 0.9 % IV SOLN: Freq: Once | INTRAVENOUS | Status: AC

## 2022-02-12 NOTE — Patient Instructions (Signed)
[facility] ONCOLOGY  Discharge Instructions: Thank you for choosing [facility] to provide your oncology and hematology care.   If you have a lab appointment with the [facility], please go directly to the [facility] and check in at the registration area.   Wear comfortable clothing and clothing appropriate for easy access to any Portacath or PICC line.   We strive to give you quality time with your provider. You may need to reschedule your appointment if you arrive late (15 or more minutes).  Arriving late affects you and other patients whose appointments are after yours.  Also, if you miss three or more appointments without notifying the office, you may be dismissed from the clinic at the provider's discretion.      For prescription refill requests, have your pharmacy contact our office and allow 72 hours for refills to be completed.    Today you received the following chemotherapy and/or immunotherapy agents: ado-trastuzumab-emtansine (Kadcyla)      To help prevent nausea and vomiting after your treatment, we encourage you to take your nausea medication as directed.  BELOW ARE SYMPTOMS THAT SHOULD BE REPORTED IMMEDIATELY: *FEVER GREATER THAN 100.4 F (38 C) OR HIGHER *CHILLS OR SWEATING *NAUSEA AND VOMITING THAT IS NOT CONTROLLED WITH YOUR NAUSEA MEDICATION *UNUSUAL SHORTNESS OF BREATH *UNUSUAL BRUISING OR BLEEDING *URINARY PROBLEMS (pain or burning when urinating, or frequent urination) *BOWEL PROBLEMS (unusual diarrhea, constipation, pain near the anus) TENDERNESS IN MOUTH AND THROAT WITH OR WITHOUT PRESENCE OF ULCERS (sore throat, sores in mouth, or a toothache) UNUSUAL RASH, SWELLING OR PAIN  UNUSUAL VAGINAL DISCHARGE OR ITCHING   Items with * indicate a potential emergency and should be followed up as soon as possible or go to the Emergency Department if any problems should occur.  Please show the CHEMOTHERAPY ALERT CARD or IMMUNOTHERAPY  ALERT CARD at check-in to the Emergency Department and triage nurse.  Should you have questions after your visit or need to cancel or reschedule your appointment, please contact [facility]  Dept: [phone]  and follow the prompts.  Office hours are 8:00 a.m. to 4:30 p.m. Monday - Friday. Please note that voicemails left after 4:00 p.m. may not be returned until the following business day.  We are closed weekends and major holidays. You have access to a nurse at all times for urgent questions. Please call the main number to the clinic Dept: [phone] and follow the prompts.   For any non-urgent questions, you may also contact your provider using MyChart. We now offer e-Visits for anyone 49 and older to request care online for non-urgent symptoms. For details visit [email or web address].   Also download the MyChart app! Go to the app store, search "MyChart", open the app, select [facility], and log in with your MyChart username and password.  Masks are optional in the cancer centers. If you would like for your care team to wear a mask while they are taking care of you, please let them know. You may have one support person who is at least 68 years old accompany you for your appointments.

## 2022-02-13 ENCOUNTER — Other Ambulatory Visit: Payer: Self-pay

## 2022-02-13 ENCOUNTER — Encounter: Payer: Self-pay | Admitting: *Deleted

## 2022-02-13 ENCOUNTER — Ambulatory Visit
Admission: RE | Admit: 2022-02-13 | Discharge: 2022-02-13 | Disposition: A | Discharge status: Home / Self Care | Payer: Medicare Other | Source: Ambulatory Visit | Attending: Radiation Oncology | Admitting: Radiation Oncology

## 2022-02-13 DIAGNOSIS — Z510 Encounter for antineoplastic radiation therapy: Secondary | ICD-10-CM | POA: Diagnosis not present

## 2022-02-13 LAB — RAD ONC ARIA SESSION SUMMARY

## 2022-02-14 ENCOUNTER — Other Ambulatory Visit: Payer: Self-pay

## 2022-02-14 ENCOUNTER — Ambulatory Visit
Admission: RE | Admit: 2022-02-14 | Discharge: 2022-02-14 | Disposition: A | Discharge status: Home / Self Care | Payer: Medicare Other | Source: Ambulatory Visit | Attending: Radiation Oncology | Admitting: Radiation Oncology

## 2022-02-14 DIAGNOSIS — Z510 Encounter for antineoplastic radiation therapy: Secondary | ICD-10-CM | POA: Diagnosis not present

## 2022-02-14 DIAGNOSIS — Z170 Estrogen receptor positive status [ER+]: Secondary | ICD-10-CM

## 2022-02-14 LAB — RAD ONC ARIA SESSION SUMMARY

## 2022-02-14 MED ORDER — ALRA NON-METALLIC DEODORANT (RAD-ONC): 1.0000 | Freq: Once | TOPICAL | Status: DC

## 2022-02-14 MED ORDER — RADIAPLEXRX EX GEL: Freq: Once | CUTANEOUS | Status: AC

## 2022-02-17 ENCOUNTER — Other Ambulatory Visit: Payer: Self-pay

## 2022-02-17 ENCOUNTER — Ambulatory Visit
Admission: RE | Admit: 2022-02-17 | Discharge: 2022-02-17 | Disposition: A | Discharge status: Home / Self Care | Payer: Medicare Other | Source: Ambulatory Visit | Attending: Radiation Oncology | Admitting: Radiation Oncology

## 2022-02-17 DIAGNOSIS — Z510 Encounter for antineoplastic radiation therapy: Secondary | ICD-10-CM | POA: Diagnosis not present

## 2022-02-17 LAB — RAD ONC ARIA SESSION SUMMARY
Course Elapsed Days: 4
Plan Fractions Treated to Date: 3
Plan Fractions Treated to Date: 3
Plan Prescribed Dose Per Fraction: 1.8 Gy
Plan Prescribed Dose Per Fraction: 1.8 Gy
Plan Total Fractions Prescribed: 28
Plan Total Fractions Prescribed: 28
Plan Total Prescribed Dose: 50.4 Gy
Plan Total Prescribed Dose: 50.4 Gy
Reference Point Dosage Given to Date: 5.4 Gy
Reference Point Dosage Given to Date: 5.4 Gy
Reference Point Session Dosage Given: 1.8 Gy
Reference Point Session Dosage Given: 1.8 Gy
Session Number: 3

## 2022-02-18 ENCOUNTER — Ambulatory Visit: Payer: Medicare Other

## 2022-02-19 ENCOUNTER — Ambulatory Visit
Admission: RE | Admit: 2022-02-19 | Discharge: 2022-02-19 | Disposition: A | Discharge status: Home / Self Care | Payer: Medicare Other | Source: Ambulatory Visit | Attending: Radiation Oncology | Admitting: Radiation Oncology

## 2022-02-19 ENCOUNTER — Other Ambulatory Visit: Payer: Self-pay

## 2022-02-19 DIAGNOSIS — Z510 Encounter for antineoplastic radiation therapy: Secondary | ICD-10-CM | POA: Diagnosis not present

## 2022-02-19 LAB — RAD ONC ARIA SESSION SUMMARY
Course Elapsed Days: 6
Plan Fractions Treated to Date: 4
Plan Fractions Treated to Date: 4
Plan Prescribed Dose Per Fraction: 1.8 Gy
Plan Prescribed Dose Per Fraction: 1.8 Gy
Plan Total Fractions Prescribed: 28
Plan Total Fractions Prescribed: 28
Plan Total Prescribed Dose: 50.4 Gy
Plan Total Prescribed Dose: 50.4 Gy
Reference Point Dosage Given to Date: 7.2 Gy
Reference Point Dosage Given to Date: 7.2 Gy
Reference Point Session Dosage Given: 1.8 Gy
Reference Point Session Dosage Given: 1.8 Gy
Session Number: 4

## 2022-02-24 ENCOUNTER — Other Ambulatory Visit: Payer: Self-pay

## 2022-02-24 ENCOUNTER — Ambulatory Visit
Admission: RE | Admit: 2022-02-24 | Discharge: 2022-02-24 | Disposition: A | Discharge status: Home / Self Care | Payer: Medicare Other | Source: Ambulatory Visit | Attending: Radiation Oncology | Admitting: Radiation Oncology

## 2022-02-24 DIAGNOSIS — Z510 Encounter for antineoplastic radiation therapy: Secondary | ICD-10-CM | POA: Diagnosis not present

## 2022-02-24 LAB — RAD ONC ARIA SESSION SUMMARY
Course Elapsed Days: 11
Plan Fractions Treated to Date: 5
Plan Fractions Treated to Date: 5
Plan Prescribed Dose Per Fraction: 1.8 Gy
Plan Prescribed Dose Per Fraction: 1.8 Gy
Plan Total Fractions Prescribed: 28
Plan Total Fractions Prescribed: 28
Plan Total Prescribed Dose: 50.4 Gy
Plan Total Prescribed Dose: 50.4 Gy
Reference Point Dosage Given to Date: 9 Gy
Reference Point Dosage Given to Date: 9 Gy
Reference Point Session Dosage Given: 1.8 Gy
Reference Point Session Dosage Given: 1.8 Gy
Session Number: 5

## 2022-02-25 ENCOUNTER — Other Ambulatory Visit: Payer: Self-pay

## 2022-02-25 ENCOUNTER — Ambulatory Visit
Admission: RE | Admit: 2022-02-25 | Discharge: 2022-02-25 | Disposition: A | Discharge status: Home / Self Care | Payer: Medicare Other | Source: Ambulatory Visit | Attending: Radiation Oncology | Admitting: Radiation Oncology

## 2022-02-25 DIAGNOSIS — Z510 Encounter for antineoplastic radiation therapy: Secondary | ICD-10-CM | POA: Diagnosis not present

## 2022-02-25 LAB — RAD ONC ARIA SESSION SUMMARY
Course Elapsed Days: 12
Plan Fractions Treated to Date: 6
Plan Fractions Treated to Date: 6
Plan Prescribed Dose Per Fraction: 1.8 Gy
Plan Prescribed Dose Per Fraction: 1.8 Gy
Plan Total Fractions Prescribed: 28
Plan Total Fractions Prescribed: 28
Plan Total Prescribed Dose: 50.4 Gy
Plan Total Prescribed Dose: 50.4 Gy
Reference Point Dosage Given to Date: 10.8 Gy
Reference Point Dosage Given to Date: 10.8 Gy
Reference Point Session Dosage Given: 1.8 Gy
Reference Point Session Dosage Given: 1.8 Gy
Session Number: 6

## 2022-02-26 ENCOUNTER — Other Ambulatory Visit: Payer: Self-pay

## 2022-02-26 ENCOUNTER — Institutional Professional Consult (permissible substitution) (INDEPENDENT_AMBULATORY_CARE_PROVIDER_SITE_OTHER): Payer: Medicare Other | Admitting: Surgical

## 2022-02-26 ENCOUNTER — Encounter: Payer: Self-pay | Admitting: Surgical

## 2022-02-26 ENCOUNTER — Ambulatory Visit
Admission: RE | Admit: 2022-02-26 | Discharge: 2022-02-26 | Disposition: A | Discharge status: Home / Self Care | Payer: Medicare Other | Source: Ambulatory Visit | Attending: Radiation Oncology | Admitting: Radiation Oncology

## 2022-02-26 VITALS — BP 159/83 | HR 103 | Resp 18 | Ht 66.0 in | Wt 154.0 lb

## 2022-02-26 DIAGNOSIS — I7121 Aneurysm of the ascending aorta, without rupture: Secondary | ICD-10-CM

## 2022-02-26 DIAGNOSIS — Z510 Encounter for antineoplastic radiation therapy: Secondary | ICD-10-CM | POA: Diagnosis not present

## 2022-02-26 LAB — RAD ONC ARIA SESSION SUMMARY
Course Elapsed Days: 13
Plan Fractions Treated to Date: 7
Plan Fractions Treated to Date: 7
Plan Prescribed Dose Per Fraction: 1.8 Gy
Plan Prescribed Dose Per Fraction: 1.8 Gy
Plan Total Fractions Prescribed: 28
Plan Total Fractions Prescribed: 28
Plan Total Prescribed Dose: 50.4 Gy
Plan Total Prescribed Dose: 50.4 Gy
Reference Point Dosage Given to Date: 12.6 Gy
Reference Point Dosage Given to Date: 12.6 Gy
Reference Point Session Dosage Given: 1.8 Gy
Reference Point Session Dosage Given: 1.8 Gy
Session Number: 7

## 2022-02-26 NOTE — Patient Instructions (Signed)
Activity and lifestyle management as discussed in office.  Additionally the importance of blood pressure control.

## 2022-02-26 NOTE — Progress Notes (Signed)
Subjective:     Patient ID: Amber Mccormick, female    DOB: 1955/05/26, 66 y.o.   MRN: [id number]  Chief Complaint  Patient presents with   Thoracic Aortic Aneurysm    CTA Chest 12/27/21    HPI Patient is in today for CT surgical evaluation for ascending thoracic aortic aneurysm found on CT scan in September of this year.  The full report as described below.  She is currently undergoing radiation for breast carcinoma and has completed chemotherapy.  She does have a history of hypertension which she reports is well-controlled.  The aneurysm measures 4.0 cm in diameter.  She denies chest pain or other cardiac symptoms.  She does report that she does have to deal with anxiety.  Review of Systems  All other systems reviewed and are negative.    Patient Active Problem List   Diagnosis Date Noted   Port-A-Cath in place 08/22/2021   Malignant neoplasm of upper-outer quadrant of left breast, estrogen receptor positive ([place]) 08/08/2021   Mixed hyperlipidemia 05/21/2020   Anxiety 05/21/2020   Acquired hypothyroidism 05/21/2020    Past Medical History:  Diagnosis Date   Anxiety    Cancer ([facility]) 05/2021   right breast IDC, + lymph node   Hyperlipidemia    Hypertension    no meds now, BP has been good   Hypothyroidism      Past Surgical History:  Procedure Laterality Date   BREAST LUMPECTOMY WITH RADIOACTIVE SEED AND SENTINEL LYMPH NODE BIOPSY Left 01/01/2022   Procedure: LEFT BREAST LUMPECTOMY WITH RADIOACTIVE SEED AND SENTINEL LYMPH NODE BIOPSY;  Surgeon: Erroll Luna, MD;  Location: [facility];  Service: General;  Laterality: Left;   MOHS SURGERY     on nose   PORTACATH PLACEMENT Right 08/21/2021   Procedure: INSERTION PORT-A-CATH;  Surgeon: Erroll Luna, MD;  Location: [facility];  Service: General;  Laterality: Right;   RADIOACTIVE SEED GUIDED AXILLARY SENTINEL LYMPH NODE Left 01/01/2022   Procedure: RADIOACTIVE SEED GUIDED LEFT AXILLARY  SENTINEL LYMPH NODE BIOPSY;  Surgeon: Erroll Luna, MD;  Location: [facility];  Service: General;  Laterality: Left;   TUBAL LIGATION       Current Outpatient Medications  Medication Instructions   amitriptyline (ELAVIL) 10 mg, Oral, Daily at bedtime   amLODipine (NORVASC) 5 mg, Oral, Daily   ascorbic acid (VITAMIN C) 500 mg, Oral, Daily   atorvastatin (LIPITOR) 10 mg, Oral, Daily   cholecalciferol (VITAMIN D3) 1,000 Units, Oral, Daily   diphenhydrAMINE (BENADRYL) 25 mg, Oral, Every 6 hours PRN   diphenoxylate-atropine (LOMOTIL) 2.5-0.025 MG tablet 1 tablet, Oral, 4 times daily PRN   ibuprofen (ADVIL) 800 mg, Oral, Every 8 hours PRN   ibuprofen (ADVIL) 800 mg, Oral, Every 8 hours PRN   levothyroxine (SYNTHROID) 50 mcg, Oral, Daily   metroNIDAZOLE (METROCREAM) 0.75 % cream Topical, 2 times daily PRN   oxyCODONE (OXY IR/ROXICODONE) 5 mg, Oral, Every 6 hours PRN   vitamin B-12 (CYANOCOBALAMIN) 500 MCG tablet Oral     No Known Allergies   Social History   Occupational History   Not on file  Tobacco Use   Smoking status: Never   Smokeless tobacco: Never  Substance and Sexual Activity   Alcohol use: Never   Drug use: Never   Sexual activity: Yes    Birth control/protection: Post-menopausal        Objective:    Ht '5\' 6"'$  (1.676 m)   BMI 24.42 kg/m  BP Readings from Last 3 Encounters:  02/26/22 (!) 159/83  02/12/22 133/72  02/05/22 (!) 165/81   Wt Readings from Last 3 Encounters:  02/26/22 154 lb (69.9 kg)  02/12/22 151 lb 4.8 oz (68.6 kg)  02/05/22 152 lb 4 oz (69.1 kg)      Physical Exam Vitals reviewed.  Constitutional:      Appearance: Normal appearance.  HENT:     Head: Normocephalic and atraumatic.     Mouth/Throat:     Mouth: Mucous membranes are moist.     Pharynx: Oropharynx is clear.     Comments: Poor dentition Eyes:     Pupils: Pupils are equal, round, and reactive to light.  Neck:     Vascular: No carotid bruit.   Cardiovascular:     Rate and Rhythm: Tachycardia present.     Pulses: Normal pulses.     Heart sounds: Normal heart sounds. No murmur heard. Pulmonary:     Effort: Pulmonary effort is normal.     Breath sounds: Normal breath sounds.  Abdominal:     General: Abdomen is flat.     Palpations: Abdomen is soft.  Musculoskeletal:     Cervical back: Normal range of motion and neck supple.     Right lower leg: No edema.     Left lower leg: No edema.  Lymphadenopathy:     Cervical: No cervical adenopathy.  Skin:    General: Skin is warm.     Capillary Refill: Capillary refill takes less than 2 seconds.     Coloration: Skin is not jaundiced or pale.  Neurological:     General: No focal deficit present.     Mental Status: She is alert and oriented to person, place, and time.  Psychiatric:        Mood and Affect: Mood normal.        Behavior: Behavior normal.        Thought Content: Thought content normal.        Judgment: Judgment normal.     Results for orders placed or performed in visit on 02/26/22  Rad Onc [facility] Session Summary  Result Value Ref Range   Course ID [id number]    Course Intent Curative    Course Start Date 02/05/2022  3:16 PM    Session Number 7    Course First Treatment Date 02/13/2022  4:09 PM    Course Last Treatment Date 02/26/2022 11:39 AM    Course Elapsed Days 13    Reference Point ID Breast_L_BH DP    Reference Point Dosage Given to Date 12.6 Gy   Reference Point Session Dosage Given 1.8 Gy   Reference Point ID Brst_L_Scv_BH DP    Reference Point Dosage Given to Date 12.6 Gy   Reference Point Session Dosage Given 1.8 Gy   Plan ID Breast_L_BH    Plan Name Breast_L_BH    Plan Fractions Treated to Date 7    Plan Total Fractions Prescribed 28    Plan Prescribed Dose Per Fraction 1.8 Gy   Plan Total Prescribed Dose 50.400000 Gy   Plan Primary Reference Point Breast_L_BH DP    Plan ID Brst_L_Scv_BH    Plan Name Brst_L_Scv_BH    Plan Fractions Treated to  Date 7    Plan Total Fractions Prescribed 28    Plan Prescribed Dose Per Fraction 1.8 Gy   Plan Total Prescribed Dose 50.400000 Gy   Plan Primary Reference Point Brst_L_Scv_BH DP         [facility]  Narrative & Impression  CLINICAL DATA:  Classic aortic aneurysm.   EXAM: CT ANGIOGRAPHY CHEST WITH CONTRAST   TECHNIQUE: Multidetector CT imaging of the chest was performed using the standard protocol during bolus administration of intravenous contrast. Multiplanar CT image reconstructions and MIPs were obtained to evaluate the vascular anatomy.   RADIATION DOSE REDUCTION: This exam was performed according to the departmental dose-optimization program which includes automated exposure control, adjustment of the mA and/or kV according to patient size and/or use of iterative reconstruction technique.   CONTRAST:  74m OMNIPAQUE IOHEXOL 350 MG/ML SOLN   COMPARISON:  December 09, 2021.   FINDINGS: Cardiovascular: 4.0 cm ascending thoracic aortic aneurysm is noted without dissection. Atherosclerosis of thoracic aorta is noted. Normal cardiac size. No pericardial effusion.   Mediastinum/Nodes: No enlarged mediastinal, hilar, or axillary lymph nodes. Thyroid gland, trachea, and esophagus demonstrate no significant findings.   Lungs/Pleura: Lungs are clear. No pleural effusion or pneumothorax.   Upper Abdomen: No acute abnormality.   Musculoskeletal: No chest wall abnormality. No acute or significant osseous findings.   Review of the MIP images confirms the above findings.   IMPRESSION: 4.0 cm ascending thoracic aortic aneurysm. Recommend annual imaging followup by CTA or MRA. This recommendation follows 2010 ACCF/AHA/AATS/ACR/ASA/SCA/SCAI/SIR/STS/SVM Guidelines for the Diagnosis and Management of Patients with Thoracic Aortic Disease. Circulation. 2010; 121:: W979-G921 Aortic aneurysm NOS (ICD10-I71.9).   Aortic Atherosclerosis (ICD10-I70.0).     Electronically Signed   By:  JMarijo ConceptionM.D.   On: 12/27/2021 13:25    ssment & Plan:   Problem List Items Addressed This Visit   None Visit Diagnoses     Aneurysm of ascending aorta without rupture ([facility]    -  Primary      A/P 4.0 cm ascending thoracic aortic aneurysm.  We discussed the significance of such a finding as well as his importance of healthy lifestyle choices, in particular diet and exercise.  But additionally things such as improving sleep habits, getting enough sunlight and attempting to alleviate stress as much as is feasible.  He in addition, we discussed the importance of blood pressure control.  We discussed the importance terms of aneurysm progression and I encouraged her to keep a diary and close contact with her primary provider's so that if she does need additional medication or increasing doses we can manage that as efficiently as possible.  We will see the patient again in follow-up in 1 year with a repeat chest CTA.  No orders of the defined types were placed in this encounter.   No follow-ups on file.  WJohn Giovanni PA-C

## 2022-02-27 ENCOUNTER — Ambulatory Visit
Admission: RE | Admit: 2022-02-27 | Discharge: 2022-02-27 | Disposition: A | Discharge status: Home / Self Care | Payer: Medicare Other | Source: Ambulatory Visit | Attending: Radiation Oncology | Admitting: Radiation Oncology

## 2022-02-27 ENCOUNTER — Other Ambulatory Visit: Payer: Self-pay

## 2022-02-27 DIAGNOSIS — Z510 Encounter for antineoplastic radiation therapy: Secondary | ICD-10-CM | POA: Diagnosis not present

## 2022-02-27 LAB — RAD ONC ARIA SESSION SUMMARY
Course Elapsed Days: 14
Plan Fractions Treated to Date: 8
Plan Fractions Treated to Date: 8
Plan Prescribed Dose Per Fraction: 1.8 Gy
Plan Prescribed Dose Per Fraction: 1.8 Gy
Plan Total Fractions Prescribed: 28
Plan Total Fractions Prescribed: 28
Plan Total Prescribed Dose: 50.4 Gy
Plan Total Prescribed Dose: 50.4 Gy
Reference Point Dosage Given to Date: 14.4 Gy
Reference Point Dosage Given to Date: 14.4 Gy
Reference Point Session Dosage Given: 1.8 Gy
Reference Point Session Dosage Given: 1.8 Gy
Session Number: 8

## 2022-02-28 ENCOUNTER — Ambulatory Visit
Admission: RE | Admit: 2022-02-28 | Discharge: 2022-02-28 | Disposition: A | Discharge status: Home / Self Care | Payer: Medicare Other | Source: Ambulatory Visit | Attending: Radiation Oncology | Admitting: Radiation Oncology

## 2022-02-28 ENCOUNTER — Other Ambulatory Visit: Payer: Self-pay

## 2022-02-28 DIAGNOSIS — Z170 Estrogen receptor positive status [ER+]: Secondary | ICD-10-CM | POA: Insufficient documentation

## 2022-02-28 DIAGNOSIS — C50412 Malignant neoplasm of upper-outer quadrant of left female breast: Secondary | ICD-10-CM | POA: Insufficient documentation

## 2022-02-28 LAB — RAD ONC ARIA SESSION SUMMARY
Course Elapsed Days: 15
Plan Fractions Treated to Date: 9
Plan Fractions Treated to Date: 9
Plan Prescribed Dose Per Fraction: 1.8 Gy
Plan Prescribed Dose Per Fraction: 1.8 Gy
Plan Total Fractions Prescribed: 28
Plan Total Fractions Prescribed: 28
Plan Total Prescribed Dose: 50.4 Gy
Plan Total Prescribed Dose: 50.4 Gy
Reference Point Dosage Given to Date: 16.2 Gy
Reference Point Dosage Given to Date: 16.2 Gy
Reference Point Session Dosage Given: 1.8 Gy
Reference Point Session Dosage Given: 1.8 Gy
Session Number: 9

## 2022-02-28 MED ORDER — RADIAPLEXRX EX GEL: Freq: Once | CUTANEOUS | Status: AC

## 2022-03-01 NOTE — Progress Notes (Signed)
Patient Care Team: Kathreen Devoid, PA-C as PCP - Philomena Doheny, Paulette Blanch, RN as Oncology Nurse Navigator Rockwell Germany, RN as Oncology Nurse Navigator Nicholas Lose, MD as Consulting Physician (Hematology and Oncology) Minus Breeding, MD as Consulting Physician (Cardiology) Paralee Cancel, MD as Consulting Physician (Orthopedic Surgery)  DIAGNOSIS: No diagnosis found.  SUMMARY OF ONCOLOGIC HISTORY: Oncology History  Malignant neoplasm of upper-outer quadrant of left breast, estrogen receptor positive ([place])  07/01/2021 Initial Diagnosis   Screening mammogram 06/27/2021 detected abnormality in the left breast which led to diagnostic mammogram and left breast ultrasound that revealed hypoechoic mass with irregular borders 2:30 position 2 cm left axilla: 1 abnormal lymph node, grade 2 IDC with DCIS ER 99%, PR 95%, HER2 2+ by IHC, FISH positive, Ki-67 20%, left axilla lymph node: Positive   08/08/2021 Cancer Staging   Staging form: Breast, AJCC 8th Edition - Clinical stage from 08/08/2021: Stage IB (cT1c, cN1(f), cM0, G2, ER+, PR+, HER2+) - Signed by Nicholas Lose, MD on 08/08/2021 Stage prefix: Initial diagnosis Method of lymph node assessment: Core biopsy Histologic grading system: 3 grade system   08/14/2021 - 12/26/2021 Chemotherapy   Patient is on Treatment Plan : BREAST  Docetaxel + Carboplatin + Trastuzumab + Pertuzumab  (TCHP) q21d / Trastuzumab + Pertuzumab q21d     08/22/2021 - 11/14/2021 Chemotherapy   Patient is on Treatment Plan : BREAST  Docetaxel + Carboplatin + Trastuzumab + Pertuzumab  (TCHP) q21d      01/22/2022 -  Chemotherapy   Patient is on Treatment Plan : BREAST ADO-Trastuzumab Emtansine (Kadcyla) q21d       CHIEF COMPLIANT: Follow-up left breast on Kadcyla    INTERVAL HISTORY: Merly Hinkson is a 66 y.o with the above-mentioned left breast cancer currently on kadcyla maintenance. She presents to the clinic for a follow-up.    ALLERGIES:  has No Known  Allergies.  MEDICATIONS:  Current Outpatient Medications  Medication Sig Dispense Refill   amitriptyline (ELAVIL) 10 MG tablet Take 10 mg by mouth at bedtime.     amLODipine (NORVASC) 5 MG tablet Take 5 mg by mouth daily.     atorvastatin (LIPITOR) 10 MG tablet Take 10 mg by mouth daily.     cholecalciferol (VITAMIN D3) 25 MCG (1000 UNIT) tablet Take 1,000 Units by mouth daily.     diphenhydrAMINE (BENADRYL) 25 MG tablet Take 25 mg by mouth every 6 (six) hours as needed.     diphenoxylate-atropine (LOMOTIL) 2.5-0.025 MG tablet Take 1 tablet by mouth 4 (four) times daily as needed for diarrhea or loose stools. 30 tablet 3   ibuprofen (ADVIL) 800 MG tablet Take 1 tablet (800 mg total) by mouth every 8 (eight) hours as needed. 30 tablet 0   ibuprofen (ADVIL) 800 MG tablet Take 1 tablet (800 mg total) by mouth every 8 (eight) hours as needed. 30 tablet 0   levothyroxine (SYNTHROID) 50 MCG tablet Take 50 mcg by mouth daily.     metroNIDAZOLE (METROCREAM) 0.75 % cream Apply topically 2 (two) times daily as needed.     oxyCODONE (OXY IR/ROXICODONE) 5 MG immediate release tablet Take 1 tablet (5 mg total) by mouth every 6 (six) hours as needed for severe pain. 15 tablet 0   vitamin B-12 (CYANOCOBALAMIN) 500 MCG tablet Take by mouth.     vitamin C (ASCORBIC ACID) 500 MG tablet Take 500 mg by mouth daily.     No current facility-administered medications for this visit.    PHYSICAL EXAMINATION:  ECOG PERFORMANCE STATUS: {CHL ONC ECOG [phone]}  There were no vitals filed for this visit. There were no vitals filed for this visit.  BREAST:*** No palpable masses or nodules in either right or left breasts. No palpable axillary supraclavicular or infraclavicular adenopathy no breast tenderness or nipple discharge. (exam performed in the presence of a chaperone)  LABORATORY DATA:  I have reviewed the data as listed    Latest Ref Rng & Units 02/12/2022    9:00 AM 01/22/2022    8:01 AM 01/16/2022     9:47 AM  CMP  Glucose 70 - 99 mg/dL 126  90  97   BUN 8 - 23 mg/dL _0 Creatinine 0.44 - 1.00 mg/dL 0.69  0.66  0.59   Sodium 135 - 145 mmol/L 140  139  140   Potassium 3.5 - 5.1 mmol/L 3.7  3.8  4.1   Chloride 98 - 111 mmol/L 104  106  107   CO2 22 - 32 mmol/L _1 Calcium 8.9 - 10.3 mg/dL 9.2  9.0  9.0   Total Protein 6.5 - 8.1 g/dL 7.0  6.5  6.2   Total Bilirubin 0.3 - 1.2 mg/dL 0.3  0.4  0.4   Alkaline Phos 38 - 126 U/L 108  113  103   AST 15 - 41 U/L _2 ALT 0 - 44 U/L _3 Lab Results  Component Value Date   WBC 5.5 02/12/2022   HGB 12.5 02/12/2022   HCT 37.5 02/12/2022   MCV 90.8 02/12/2022   PLT 183 02/12/2022   NEUTROABS 3.0 02/12/2022    ASSESSMENT & PLAN:  No problem-specific Assessment & Plan notes found for this encounter.    No orders of the defined types were placed in this encounter.  The patient has a good understanding of the overall plan. she agrees with it. she will call with any problems that may develop before the next visit here. Total time spent: 30 mins including face to face time and time spent for planning, charting and co-ordination of care   Suzzette Righter, [place] 03/01/22    I Gardiner Coins am scribing for Dr. Lindi Adie  ***

## 2022-03-03 ENCOUNTER — Other Ambulatory Visit: Payer: Self-pay

## 2022-03-03 ENCOUNTER — Telehealth: Payer: Self-pay | Admitting: Hematology and Oncology

## 2022-03-03 ENCOUNTER — Ambulatory Visit (HOSPITAL_COMMUNITY)
Admission: RE | Admit: 2022-03-03 | Discharge: 2022-03-03 | Disposition: A | Discharge status: Home / Self Care | Payer: Medicare Other | Source: Ambulatory Visit | Attending: Hematology and Oncology | Admitting: Hematology and Oncology

## 2022-03-03 ENCOUNTER — Ambulatory Visit
Admission: RE | Admit: 2022-03-03 | Discharge: 2022-03-03 | Disposition: A | Discharge status: Home / Self Care | Payer: Medicare Other | Source: Ambulatory Visit | Attending: Radiation Oncology | Admitting: Radiation Oncology

## 2022-03-03 DIAGNOSIS — I427 Cardiomyopathy due to drug and external agent: Secondary | ICD-10-CM | POA: Diagnosis not present

## 2022-03-03 DIAGNOSIS — Z170 Estrogen receptor positive status [ER+]: Secondary | ICD-10-CM | POA: Diagnosis not present

## 2022-03-03 DIAGNOSIS — C50412 Malignant neoplasm of upper-outer quadrant of left female breast: Secondary | ICD-10-CM | POA: Insufficient documentation

## 2022-03-03 DIAGNOSIS — Z0189 Encounter for other specified special examinations: Secondary | ICD-10-CM

## 2022-03-03 DIAGNOSIS — I517 Cardiomegaly: Secondary | ICD-10-CM | POA: Insufficient documentation

## 2022-03-03 DIAGNOSIS — Z01818 Encounter for other preprocedural examination: Secondary | ICD-10-CM | POA: Diagnosis present

## 2022-03-03 LAB — RAD ONC ARIA SESSION SUMMARY
Course Elapsed Days: 18
Plan Fractions Treated to Date: 10
Plan Fractions Treated to Date: 10
Plan Prescribed Dose Per Fraction: 1.8 Gy
Plan Prescribed Dose Per Fraction: 1.8 Gy
Plan Total Fractions Prescribed: 28
Plan Total Fractions Prescribed: 28
Plan Total Prescribed Dose: 50.4 Gy
Plan Total Prescribed Dose: 50.4 Gy
Reference Point Dosage Given to Date: 18 Gy
Reference Point Dosage Given to Date: 18 Gy
Reference Point Session Dosage Given: 1.8 Gy
Reference Point Session Dosage Given: 1.8 Gy
Session Number: 10

## 2022-03-03 LAB — ECHOCARDIOGRAM COMPLETE
Area-P 1/2: 4.3 cm2
Calc EF: 66.2 %
S' Lateral: 2.1 cm
Single Plane A2C EF: 66.4 %
Single Plane A4C EF: 66 %

## 2022-03-03 NOTE — Progress Notes (Signed)
  Echocardiogram 2D Echocardiogram has been performed.  Amber Mccormick 03/03/2022, 10:39 AM

## 2022-03-03 NOTE — Telephone Encounter (Signed)
Scheduled appointment per WQ. Patient is aware. 

## 2022-03-04 ENCOUNTER — Other Ambulatory Visit: Payer: Self-pay

## 2022-03-04 ENCOUNTER — Other Ambulatory Visit (HOSPITAL_COMMUNITY): Payer: Medicare Other

## 2022-03-04 ENCOUNTER — Ambulatory Visit
Admission: RE | Admit: 2022-03-04 | Discharge: 2022-03-04 | Disposition: A | Discharge status: Home / Self Care | Payer: Medicare Other | Source: Ambulatory Visit | Attending: Radiation Oncology | Admitting: Radiation Oncology

## 2022-03-04 DIAGNOSIS — C50412 Malignant neoplasm of upper-outer quadrant of left female breast: Secondary | ICD-10-CM | POA: Diagnosis not present

## 2022-03-04 LAB — RAD ONC ARIA SESSION SUMMARY
Course Elapsed Days: 19
Plan Fractions Treated to Date: 11
Plan Fractions Treated to Date: 11
Plan Prescribed Dose Per Fraction: 1.8 Gy
Plan Prescribed Dose Per Fraction: 1.8 Gy
Plan Total Fractions Prescribed: 28
Plan Total Fractions Prescribed: 28
Plan Total Prescribed Dose: 50.4 Gy
Plan Total Prescribed Dose: 50.4 Gy
Reference Point Dosage Given to Date: 19.8 Gy
Reference Point Dosage Given to Date: 19.8 Gy
Reference Point Session Dosage Given: 1.8 Gy
Reference Point Session Dosage Given: 1.8 Gy
Session Number: 11

## 2022-03-05 ENCOUNTER — Other Ambulatory Visit: Payer: Self-pay

## 2022-03-05 ENCOUNTER — Ambulatory Visit
Admission: RE | Admit: 2022-03-05 | Discharge: 2022-03-05 | Disposition: A | Discharge status: Home / Self Care | Payer: Medicare Other | Source: Ambulatory Visit | Attending: Radiation Oncology | Admitting: Radiation Oncology

## 2022-03-05 ENCOUNTER — Inpatient Hospital Stay (HOSPITAL_BASED_OUTPATIENT_CLINIC_OR_DEPARTMENT_OTHER): Payer: Medicare Other | Admitting: Hematology and Oncology

## 2022-03-05 ENCOUNTER — Inpatient Hospital Stay: Payer: Medicare Other

## 2022-03-05 VITALS — BP 147/79 | HR 95 | Temp 97.3°F | Resp 18 | Ht 66.0 in | Wt 152.9 lb

## 2022-03-05 DIAGNOSIS — Z170 Estrogen receptor positive status [ER+]: Secondary | ICD-10-CM

## 2022-03-05 DIAGNOSIS — Z95828 Presence of other vascular implants and grafts: Secondary | ICD-10-CM

## 2022-03-05 DIAGNOSIS — Z5112 Encounter for antineoplastic immunotherapy: Secondary | ICD-10-CM | POA: Insufficient documentation

## 2022-03-05 DIAGNOSIS — C50412 Malignant neoplasm of upper-outer quadrant of left female breast: Secondary | ICD-10-CM

## 2022-03-05 DIAGNOSIS — Z79899 Other long term (current) drug therapy: Secondary | ICD-10-CM | POA: Insufficient documentation

## 2022-03-05 LAB — RAD ONC ARIA SESSION SUMMARY
Course Elapsed Days: 20
Plan Fractions Treated to Date: 12
Plan Fractions Treated to Date: 12
Plan Prescribed Dose Per Fraction: 1.8 Gy
Plan Prescribed Dose Per Fraction: 1.8 Gy
Plan Total Fractions Prescribed: 28
Plan Total Fractions Prescribed: 28
Plan Total Prescribed Dose: 50.4 Gy
Plan Total Prescribed Dose: 50.4 Gy
Reference Point Dosage Given to Date: 21.6 Gy
Reference Point Dosage Given to Date: 21.6 Gy
Reference Point Session Dosage Given: 1.8 Gy
Reference Point Session Dosage Given: 1.8 Gy
Session Number: 12

## 2022-03-05 LAB — CBC WITH DIFFERENTIAL (CANCER CENTER ONLY)
Abs Immature Granulocytes: 0.01 10*3/uL (ref 0.00–0.07)
Basophils Absolute: 0 10*3/uL (ref 0.0–0.1)
Basophils Relative: 1 %
Eosinophils Absolute: 0.2 10*3/uL (ref 0.0–0.5)
Eosinophils Relative: 4 %
HCT: 37.9 % (ref 36.0–46.0)
Hemoglobin: 12.6 g/dL (ref 12.0–15.0)
Immature Granulocytes: 0 %
Lymphocytes Relative: 22 %
Lymphs Abs: 1.3 10*3/uL (ref 0.7–4.0)
MCH: 30.3 pg (ref 26.0–34.0)
MCHC: 33.2 g/dL (ref 30.0–36.0)
MCV: 91.1 fL (ref 80.0–100.0)
Monocytes Absolute: 0.7 10*3/uL (ref 0.1–1.0)
Monocytes Relative: 12 %
Neutro Abs: 3.5 10*3/uL (ref 1.7–7.7)
Neutrophils Relative %: 61 %
Platelet Count: 155 10*3/uL (ref 150–400)
RBC: 4.16 MIL/uL (ref 3.87–5.11)
RDW: 13.8 % (ref 11.5–15.5)
WBC Count: 5.7 10*3/uL (ref 4.0–10.5)
nRBC: 0 % (ref 0.0–0.2)

## 2022-03-05 LAB — CMP (CANCER CENTER ONLY)
ALT: 15 U/L (ref 0–44)
AST: 24 U/L (ref 15–41)
Albumin: 3.7 g/dL (ref 3.5–5.0)
Alkaline Phosphatase: 111 U/L (ref 38–126)
Anion gap: 5 (ref 5–15)
BUN: 15 mg/dL (ref 8–23)
CO2: 30 mmol/L (ref 22–32)
Calcium: 9.4 mg/dL (ref 8.9–10.3)
Chloride: 104 mmol/L (ref 98–111)
Creatinine: 0.66 mg/dL (ref 0.44–1.00)
GFR, Estimated: >60 mL/min (ref >60)
Glucose, Bld: 94 mg/dL (ref 70–99)
Potassium: 3.8 mmol/L (ref 3.5–5.1)
Sodium: 139 mmol/L (ref 135–145)
Total Bilirubin: 0.5 mg/dL (ref 0.3–1.2)
Total Protein: 6.8 g/dL (ref 6.5–8.1)

## 2022-03-05 MED ORDER — SODIUM CHLORIDE 0.9% FLUSH
10.0000 mL | INTRAVENOUS | Status: DC | PRN
  Administered 2022-03-05: 10 mL

## 2022-03-05 MED ORDER — DIPHENHYDRAMINE HCL 25 MG PO CAPS
25.0000 mg | ORAL_CAPSULE | Freq: Once | ORAL | Status: AC
  Administered 2022-03-05: 25 mg via ORAL
  Filled 2022-03-05: qty 1

## 2022-03-05 MED ORDER — SODIUM CHLORIDE 0.9% FLUSH
10.0000 mL | Freq: Once | INTRAVENOUS | Status: AC
  Administered 2022-03-05: 10 mL

## 2022-03-05 MED ORDER — HEPARIN SOD (PORK) LOCK FLUSH 100 UNIT/ML IV SOLN
500.0000 [IU] | Freq: Once | INTRAVENOUS | Status: AC | PRN
  Administered 2022-03-05: 500 [IU]

## 2022-03-05 MED ORDER — ACETAMINOPHEN 325 MG PO TABS
650.0000 mg | ORAL_TABLET | Freq: Once | ORAL | Status: AC
  Administered 2022-03-05: 650 mg via ORAL
  Filled 2022-03-05: qty 2

## 2022-03-05 MED ORDER — SODIUM CHLORIDE 0.9 % IV SOLN: Freq: Once | INTRAVENOUS | Status: AC

## 2022-03-05 MED ORDER — PROCHLORPERAZINE MALEATE 10 MG PO TABS
10.0000 mg | ORAL_TABLET | Freq: Once | ORAL | Status: AC
  Administered 2022-03-05: 10 mg via ORAL
  Filled 2022-03-05: qty 1

## 2022-03-05 MED ORDER — SODIUM CHLORIDE 0.9 % IV SOLN
3.6000 mg/kg | Freq: Once | INTRAVENOUS | Status: AC
  Administered 2022-03-05: 260 mg via INTRAVENOUS
  Filled 2022-03-05: qty 5

## 2022-03-05 NOTE — Assessment & Plan Note (Signed)
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 Neoadjuvant chemotherapy with TCHP x6 completed 12/05/2021 followed by Kadcyla maintenance to start 01/23/2022. 2. 01/01/2022: Left lumpectomy: Grade 2 IDC 1.5 cm with intermediate grade DCIS, margins negative, lymphovascular invasion identified, 2/6 lymph nodes positive ER 90%, PR 90%, Ki-67 5%, HER2 1+ 3. Followed by adjuvant radiation therapy 4.  Followed by adjuvant antiestrogen therapy -------------------------------------------------------------------------------------------------------------------------------------- Current Treatment: Kadcyla (declined COMPASS HER 2 trial) started 01/22/2022 today cycle 3 Labs reviewed: Normal  Kadcyla toxicities: Intermittent loose stools Slight fatigue  RTC in 3 weeks for Kadcyla maintenance

## 2022-03-05 NOTE — Patient Instructions (Signed)
[facility]  Discharge Instructions: Thank you for choosing [facility] to provide your oncology and hematology care.   If you have a lab appointment with the [facility], please go directly to the [facility] and check in at the registration area.   Wear comfortable clothing and clothing appropriate for easy access to any Portacath or PICC line.   We strive to give you quality time with your provider. You may need to reschedule your appointment if you arrive late (15 or more minutes).  Arriving late affects you and other patients whose appointments are after yours.  Also, if you miss three or more appointments without notifying the office, you may be dismissed from the clinic at the provider's discretion.      For prescription refill requests, have your pharmacy contact our office and allow 72 hours for refills to be completed.    Today you received the following chemotherapy and/or immunotherapy agents: ado-trastuzumab-emtansine (Kadcyla)      To help prevent nausea and vomiting after your treatment, we encourage you to take your nausea medication as directed.  BELOW ARE SYMPTOMS THAT SHOULD BE REPORTED IMMEDIATELY: *FEVER GREATER THAN 100.4 F (38 C) OR HIGHER *CHILLS OR SWEATING *NAUSEA AND VOMITING THAT IS NOT CONTROLLED WITH YOUR NAUSEA MEDICATION *UNUSUAL SHORTNESS OF BREATH *UNUSUAL BRUISING OR BLEEDING *URINARY PROBLEMS (pain or burning when urinating, or frequent urination) *BOWEL PROBLEMS (unusual diarrhea, constipation, pain near the anus) TENDERNESS IN MOUTH AND THROAT WITH OR WITHOUT PRESENCE OF ULCERS (sore throat, sores in mouth, or a toothache) UNUSUAL RASH, SWELLING OR PAIN  UNUSUAL VAGINAL DISCHARGE OR ITCHING   Items with * indicate a potential emergency and should be followed up as soon as possible or go to the Emergency Department if any problems should occur.  Please show the CHEMOTHERAPY ALERT CARD or IMMUNOTHERAPY  ALERT CARD at check-in to the Emergency Department and triage nurse.  Should you have questions after your visit or need to cancel or reschedule your appointment, please contact [facility]  Dept: [phone]  and follow the prompts.  Office hours are 8:00 a.m. to 4:30 p.m. Monday - Friday. Please note that voicemails left after 4:00 p.m. may not be returned until the following business day.  We are closed weekends and major holidays. You have access to a nurse at all times for urgent questions. Please call the main number to the clinic Dept: [phone] and follow the prompts.   For any non-urgent questions, you may also contact your provider using MyChart. We now offer e-Visits for anyone 34 and older to request care online for non-urgent symptoms. For details visit [email or web address].   Also download the MyChart app! Go to the app store, search "MyChart", open the app, select [facility], and log in with your MyChart username and password.  Masks are optional in the cancer centers. If you would like for your care team to wear a mask while they are taking care of you, please let them know. You may have one support person who is at least 68 years old accompany you for your appointments.

## 2022-03-06 ENCOUNTER — Other Ambulatory Visit: Payer: Self-pay

## 2022-03-06 ENCOUNTER — Ambulatory Visit
Admission: RE | Admit: 2022-03-06 | Discharge: 2022-03-06 | Disposition: A | Discharge status: Home / Self Care | Payer: Medicare Other | Source: Ambulatory Visit | Attending: Radiation Oncology | Admitting: Radiation Oncology

## 2022-03-06 DIAGNOSIS — C50412 Malignant neoplasm of upper-outer quadrant of left female breast: Secondary | ICD-10-CM | POA: Diagnosis not present

## 2022-03-06 LAB — RAD ONC ARIA SESSION SUMMARY
Course Elapsed Days: 21
Plan Fractions Treated to Date: 13
Plan Fractions Treated to Date: 13
Plan Prescribed Dose Per Fraction: 1.8 Gy
Plan Prescribed Dose Per Fraction: 1.8 Gy
Plan Total Fractions Prescribed: 28
Plan Total Fractions Prescribed: 28
Plan Total Prescribed Dose: 50.4 Gy
Plan Total Prescribed Dose: 50.4 Gy
Reference Point Dosage Given to Date: 23.4 Gy
Reference Point Dosage Given to Date: 23.4 Gy
Reference Point Session Dosage Given: 1.8 Gy
Reference Point Session Dosage Given: 1.8 Gy
Session Number: 13

## 2022-03-07 ENCOUNTER — Other Ambulatory Visit: Payer: Self-pay

## 2022-03-07 ENCOUNTER — Ambulatory Visit
Admission: RE | Admit: 2022-03-07 | Discharge: 2022-03-07 | Disposition: A | Discharge status: Home / Self Care | Payer: Medicare Other | Source: Ambulatory Visit | Attending: Radiation Oncology | Admitting: Radiation Oncology

## 2022-03-07 DIAGNOSIS — C50412 Malignant neoplasm of upper-outer quadrant of left female breast: Secondary | ICD-10-CM | POA: Diagnosis not present

## 2022-03-07 LAB — RAD ONC ARIA SESSION SUMMARY
Course Elapsed Days: 22
Plan Fractions Treated to Date: 14
Plan Fractions Treated to Date: 14
Plan Prescribed Dose Per Fraction: 1.8 Gy
Plan Prescribed Dose Per Fraction: 1.8 Gy
Plan Total Fractions Prescribed: 28
Plan Total Fractions Prescribed: 28
Plan Total Prescribed Dose: 50.4 Gy
Plan Total Prescribed Dose: 50.4 Gy
Reference Point Dosage Given to Date: 25.2 Gy
Reference Point Dosage Given to Date: 25.2 Gy
Reference Point Session Dosage Given: 1.8 Gy
Reference Point Session Dosage Given: 1.8 Gy
Session Number: 14

## 2022-03-10 ENCOUNTER — Ambulatory Visit
Admission: RE | Admit: 2022-03-10 | Discharge: 2022-03-10 | Disposition: A | Discharge status: Home / Self Care | Payer: Medicare Other | Source: Ambulatory Visit | Attending: Radiation Oncology | Admitting: Radiation Oncology

## 2022-03-10 ENCOUNTER — Other Ambulatory Visit: Payer: Self-pay

## 2022-03-10 DIAGNOSIS — C50412 Malignant neoplasm of upper-outer quadrant of left female breast: Secondary | ICD-10-CM | POA: Diagnosis not present

## 2022-03-10 LAB — RAD ONC ARIA SESSION SUMMARY
Course Elapsed Days: 25
Plan Fractions Treated to Date: 15
Plan Fractions Treated to Date: 15
Plan Prescribed Dose Per Fraction: 1.8 Gy
Plan Prescribed Dose Per Fraction: 1.8 Gy
Plan Total Fractions Prescribed: 28
Plan Total Fractions Prescribed: 28
Plan Total Prescribed Dose: 50.4 Gy
Plan Total Prescribed Dose: 50.4 Gy
Reference Point Dosage Given to Date: 27 Gy
Reference Point Dosage Given to Date: 27 Gy
Reference Point Session Dosage Given: 1.8 Gy
Reference Point Session Dosage Given: 1.8 Gy
Session Number: 15

## 2022-03-11 ENCOUNTER — Other Ambulatory Visit: Payer: Self-pay

## 2022-03-11 ENCOUNTER — Ambulatory Visit
Admission: RE | Admit: 2022-03-11 | Discharge: 2022-03-11 | Disposition: A | Discharge status: Home / Self Care | Payer: Medicare Other | Source: Ambulatory Visit | Attending: Radiation Oncology | Admitting: Radiation Oncology

## 2022-03-11 DIAGNOSIS — C50412 Malignant neoplasm of upper-outer quadrant of left female breast: Secondary | ICD-10-CM | POA: Diagnosis not present

## 2022-03-11 LAB — RAD ONC ARIA SESSION SUMMARY
Course Elapsed Days: 26
Plan Fractions Treated to Date: 16
Plan Fractions Treated to Date: 16
Plan Prescribed Dose Per Fraction: 1.8 Gy
Plan Prescribed Dose Per Fraction: 1.8 Gy
Plan Total Fractions Prescribed: 28
Plan Total Fractions Prescribed: 28
Plan Total Prescribed Dose: 50.4 Gy
Plan Total Prescribed Dose: 50.4 Gy
Reference Point Dosage Given to Date: 28.8 Gy
Reference Point Dosage Given to Date: 28.8 Gy
Reference Point Session Dosage Given: 1.8 Gy
Reference Point Session Dosage Given: 1.8 Gy
Session Number: 16

## 2022-03-12 ENCOUNTER — Ambulatory Visit
Admission: RE | Admit: 2022-03-12 | Discharge: 2022-03-12 | Disposition: A | Discharge status: Home / Self Care | Payer: Medicare Other | Source: Ambulatory Visit | Attending: Radiation Oncology | Admitting: Radiation Oncology

## 2022-03-12 ENCOUNTER — Other Ambulatory Visit: Payer: Self-pay

## 2022-03-12 DIAGNOSIS — C50412 Malignant neoplasm of upper-outer quadrant of left female breast: Secondary | ICD-10-CM | POA: Diagnosis not present

## 2022-03-12 LAB — RAD ONC ARIA SESSION SUMMARY
Course Elapsed Days: 27
Plan Fractions Treated to Date: 17
Plan Fractions Treated to Date: 17
Plan Prescribed Dose Per Fraction: 1.8 Gy
Plan Prescribed Dose Per Fraction: 1.8 Gy
Plan Total Fractions Prescribed: 28
Plan Total Fractions Prescribed: 28
Plan Total Prescribed Dose: 50.4 Gy
Plan Total Prescribed Dose: 50.4 Gy
Reference Point Dosage Given to Date: 30.6 Gy
Reference Point Dosage Given to Date: 30.6 Gy
Reference Point Session Dosage Given: 1.8 Gy
Reference Point Session Dosage Given: 1.8 Gy
Session Number: 17

## 2022-03-13 ENCOUNTER — Encounter: Payer: Self-pay | Admitting: Radiation Oncology

## 2022-03-13 ENCOUNTER — Other Ambulatory Visit: Payer: Self-pay

## 2022-03-13 ENCOUNTER — Ambulatory Visit
Admission: RE | Admit: 2022-03-13 | Discharge: 2022-03-13 | Disposition: A | Discharge status: Home / Self Care | Payer: Medicare Other | Source: Ambulatory Visit | Attending: Radiation Oncology | Admitting: Radiation Oncology

## 2022-03-13 DIAGNOSIS — C50412 Malignant neoplasm of upper-outer quadrant of left female breast: Secondary | ICD-10-CM | POA: Diagnosis not present

## 2022-03-13 LAB — RAD ONC ARIA SESSION SUMMARY
Course Elapsed Days: 28
Plan Fractions Treated to Date: 18
Plan Fractions Treated to Date: 18
Plan Prescribed Dose Per Fraction: 1.8 Gy
Plan Prescribed Dose Per Fraction: 1.8 Gy
Plan Total Fractions Prescribed: 28
Plan Total Fractions Prescribed: 28
Plan Total Prescribed Dose: 50.4 Gy
Plan Total Prescribed Dose: 50.4 Gy
Reference Point Dosage Given to Date: 32.4 Gy
Reference Point Dosage Given to Date: 32.4 Gy
Reference Point Session Dosage Given: 1.8 Gy
Reference Point Session Dosage Given: 1.8 Gy
Session Number: 18

## 2022-03-13 NOTE — Progress Notes (Signed)
  Radiation Oncology         [phone] ________________________________  Name: Amber Mccormick MRN: [id number]  Date: 03/13/2022  DOB: [birth year]  SIMULATION NOTE   NARRATIVE:  The patient underwent simulation today for ongoing radiation therapy.  The existing CT study set was employed for the purpose of virtual treatment planning.  The target and avoidance structures were reviewed and modified as necessary.  Treatment planning then occurred.  The radiation boost prescription was entered and confirmed.  A total of 3 complex treatment devices were fabricated in the form of multi-leaf collimators to shape radiation around the targets while maximally excluding nearby normal structures. I have requested : Isodose Plan.    PLAN:  This modified radiation beam arrangement is intended to continue the current radiation dose to an additional 10 Gy in 5 fractions for a total cumulative dose of 60.4 Gy.    ------------------------------------------------  Jodelle Gross, MD, PhD

## 2022-03-14 ENCOUNTER — Ambulatory Visit
Admission: RE | Admit: 2022-03-14 | Discharge: 2022-03-14 | Disposition: A | Discharge status: Home / Self Care | Payer: Medicare Other | Source: Ambulatory Visit | Attending: Radiation Oncology | Admitting: Radiation Oncology

## 2022-03-14 ENCOUNTER — Other Ambulatory Visit: Payer: Self-pay

## 2022-03-14 DIAGNOSIS — C50412 Malignant neoplasm of upper-outer quadrant of left female breast: Secondary | ICD-10-CM

## 2022-03-14 LAB — RAD ONC ARIA SESSION SUMMARY
Course Elapsed Days: 29
Plan Fractions Treated to Date: 19
Plan Fractions Treated to Date: 19
Plan Prescribed Dose Per Fraction: 1.8 Gy
Plan Prescribed Dose Per Fraction: 1.8 Gy
Plan Total Fractions Prescribed: 28
Plan Total Fractions Prescribed: 28
Plan Total Prescribed Dose: 50.4 Gy
Plan Total Prescribed Dose: 50.4 Gy
Reference Point Dosage Given to Date: 34.2 Gy
Reference Point Dosage Given to Date: 34.2 Gy
Reference Point Session Dosage Given: 1.8 Gy
Reference Point Session Dosage Given: 1.8 Gy
Session Number: 19

## 2022-03-14 MED ORDER — RADIAPLEXRX EX GEL: Freq: Once | CUTANEOUS | Status: AC

## 2022-03-17 ENCOUNTER — Other Ambulatory Visit: Payer: Self-pay

## 2022-03-17 ENCOUNTER — Ambulatory Visit
Admission: RE | Admit: 2022-03-17 | Discharge: 2022-03-17 | Disposition: A | Discharge status: Home / Self Care | Payer: Medicare Other | Source: Ambulatory Visit | Attending: Radiation Oncology | Admitting: Radiation Oncology

## 2022-03-17 DIAGNOSIS — C50412 Malignant neoplasm of upper-outer quadrant of left female breast: Secondary | ICD-10-CM | POA: Diagnosis not present

## 2022-03-17 LAB — RAD ONC ARIA SESSION SUMMARY
Course Elapsed Days: 32
Plan Fractions Treated to Date: 20
Plan Fractions Treated to Date: 20
Plan Prescribed Dose Per Fraction: 1.8 Gy
Plan Prescribed Dose Per Fraction: 1.8 Gy
Plan Total Fractions Prescribed: 28
Plan Total Fractions Prescribed: 28
Plan Total Prescribed Dose: 50.4 Gy
Plan Total Prescribed Dose: 50.4 Gy
Reference Point Dosage Given to Date: 36 Gy
Reference Point Dosage Given to Date: 36 Gy
Reference Point Session Dosage Given: 1.8 Gy
Reference Point Session Dosage Given: 1.8 Gy
Session Number: 20

## 2022-03-18 ENCOUNTER — Other Ambulatory Visit: Payer: Self-pay

## 2022-03-18 ENCOUNTER — Ambulatory Visit
Admission: RE | Admit: 2022-03-18 | Discharge: 2022-03-18 | Disposition: A | Discharge status: Home / Self Care | Payer: Medicare Other | Source: Ambulatory Visit | Attending: Radiation Oncology | Admitting: Radiation Oncology

## 2022-03-18 DIAGNOSIS — C50412 Malignant neoplasm of upper-outer quadrant of left female breast: Secondary | ICD-10-CM | POA: Diagnosis not present

## 2022-03-18 LAB — RAD ONC ARIA SESSION SUMMARY
Course Elapsed Days: 33
Plan Fractions Treated to Date: 21
Plan Fractions Treated to Date: 21
Plan Prescribed Dose Per Fraction: 1.8 Gy
Plan Prescribed Dose Per Fraction: 1.8 Gy
Plan Total Fractions Prescribed: 28
Plan Total Fractions Prescribed: 28
Plan Total Prescribed Dose: 50.4 Gy
Plan Total Prescribed Dose: 50.4 Gy
Reference Point Dosage Given to Date: 37.8 Gy
Reference Point Dosage Given to Date: 37.8 Gy
Reference Point Session Dosage Given: 1.8 Gy
Reference Point Session Dosage Given: 1.8 Gy
Session Number: 21

## 2022-03-19 ENCOUNTER — Ambulatory Visit
Admission: RE | Admit: 2022-03-19 | Discharge: 2022-03-19 | Disposition: A | Discharge status: Home / Self Care | Payer: Medicare Other | Source: Ambulatory Visit | Attending: Radiation Oncology | Admitting: Radiation Oncology

## 2022-03-19 ENCOUNTER — Other Ambulatory Visit: Payer: Self-pay

## 2022-03-19 DIAGNOSIS — C50412 Malignant neoplasm of upper-outer quadrant of left female breast: Secondary | ICD-10-CM | POA: Diagnosis not present

## 2022-03-19 LAB — RAD ONC ARIA SESSION SUMMARY
Course Elapsed Days: 34
Plan Fractions Treated to Date: 22
Plan Fractions Treated to Date: 22
Plan Prescribed Dose Per Fraction: 1.8 Gy
Plan Prescribed Dose Per Fraction: 1.8 Gy
Plan Total Fractions Prescribed: 28
Plan Total Fractions Prescribed: 28
Plan Total Prescribed Dose: 50.4 Gy
Plan Total Prescribed Dose: 50.4 Gy
Reference Point Dosage Given to Date: 39.6 Gy
Reference Point Dosage Given to Date: 39.6 Gy
Reference Point Session Dosage Given: 1.8 Gy
Reference Point Session Dosage Given: 1.8 Gy
Session Number: 22

## 2022-03-20 ENCOUNTER — Ambulatory Visit
Admission: RE | Admit: 2022-03-20 | Discharge: 2022-03-20 | Disposition: A | Discharge status: Home / Self Care | Payer: Medicare Other | Source: Ambulatory Visit | Attending: Radiation Oncology | Admitting: Radiation Oncology

## 2022-03-20 ENCOUNTER — Other Ambulatory Visit: Payer: Self-pay

## 2022-03-20 DIAGNOSIS — C50412 Malignant neoplasm of upper-outer quadrant of left female breast: Secondary | ICD-10-CM | POA: Diagnosis not present

## 2022-03-20 LAB — RAD ONC ARIA SESSION SUMMARY
Course Elapsed Days: 35
Plan Fractions Treated to Date: 23
Plan Fractions Treated to Date: 23
Plan Prescribed Dose Per Fraction: 1.8 Gy
Plan Prescribed Dose Per Fraction: 1.8 Gy
Plan Total Fractions Prescribed: 28
Plan Total Fractions Prescribed: 28
Plan Total Prescribed Dose: 50.4 Gy
Plan Total Prescribed Dose: 50.4 Gy
Reference Point Dosage Given to Date: 41.4 Gy
Reference Point Dosage Given to Date: 41.4 Gy
Reference Point Session Dosage Given: 1.8 Gy
Reference Point Session Dosage Given: 1.8 Gy
Session Number: 23

## 2022-03-21 ENCOUNTER — Ambulatory Visit: Payer: Medicare Other | Admitting: Radiation Oncology

## 2022-03-21 ENCOUNTER — Other Ambulatory Visit: Payer: Self-pay

## 2022-03-21 ENCOUNTER — Ambulatory Visit
Admission: RE | Admit: 2022-03-21 | Discharge: 2022-03-21 | Disposition: A | Discharge status: Home / Self Care | Payer: Medicare Other | Source: Ambulatory Visit | Attending: Radiation Oncology | Admitting: Radiation Oncology

## 2022-03-21 DIAGNOSIS — Z170 Estrogen receptor positive status [ER+]: Secondary | ICD-10-CM

## 2022-03-21 DIAGNOSIS — C50412 Malignant neoplasm of upper-outer quadrant of left female breast: Secondary | ICD-10-CM | POA: Diagnosis not present

## 2022-03-21 LAB — RAD ONC ARIA SESSION SUMMARY
Course Elapsed Days: 36
Plan Fractions Treated to Date: 24
Plan Fractions Treated to Date: 24
Plan Prescribed Dose Per Fraction: 1.8 Gy
Plan Prescribed Dose Per Fraction: 1.8 Gy
Plan Total Fractions Prescribed: 28
Plan Total Fractions Prescribed: 28
Plan Total Prescribed Dose: 50.4 Gy
Plan Total Prescribed Dose: 50.4 Gy
Reference Point Dosage Given to Date: 43.2 Gy
Reference Point Dosage Given to Date: 43.2 Gy
Reference Point Session Dosage Given: 1.8 Gy
Reference Point Session Dosage Given: 1.8 Gy
Session Number: 24

## 2022-03-21 MED ORDER — RADIAPLEXRX EX GEL: Freq: Once | CUTANEOUS | Status: AC

## 2022-03-25 ENCOUNTER — Ambulatory Visit
Admission: RE | Admit: 2022-03-25 | Discharge: 2022-03-25 | Disposition: A | Discharge status: Home / Self Care | Payer: Medicare Other | Source: Ambulatory Visit | Attending: Radiation Oncology | Admitting: Radiation Oncology

## 2022-03-25 ENCOUNTER — Other Ambulatory Visit: Payer: Self-pay

## 2022-03-25 DIAGNOSIS — C50412 Malignant neoplasm of upper-outer quadrant of left female breast: Secondary | ICD-10-CM | POA: Diagnosis not present

## 2022-03-25 LAB — RAD ONC ARIA SESSION SUMMARY
Course Elapsed Days: 40
Plan Fractions Treated to Date: 25
Plan Fractions Treated to Date: 25
Plan Prescribed Dose Per Fraction: 1.8 Gy
Plan Prescribed Dose Per Fraction: 1.8 Gy
Plan Total Fractions Prescribed: 28
Plan Total Fractions Prescribed: 28
Plan Total Prescribed Dose: 50.4 Gy
Plan Total Prescribed Dose: 50.4 Gy
Reference Point Dosage Given to Date: 45 Gy
Reference Point Dosage Given to Date: 45 Gy
Reference Point Session Dosage Given: 1.8 Gy
Reference Point Session Dosage Given: 1.8 Gy
Session Number: 25

## 2022-03-26 ENCOUNTER — Other Ambulatory Visit: Payer: Self-pay

## 2022-03-26 ENCOUNTER — Ambulatory Visit: Payer: Medicare Other

## 2022-03-26 ENCOUNTER — Ambulatory Visit
Admission: RE | Admit: 2022-03-26 | Discharge: 2022-03-26 | Disposition: A | Discharge status: Home / Self Care | Payer: Medicare Other | Source: Ambulatory Visit | Attending: Radiation Oncology | Admitting: Radiation Oncology

## 2022-03-26 DIAGNOSIS — C50412 Malignant neoplasm of upper-outer quadrant of left female breast: Secondary | ICD-10-CM | POA: Diagnosis not present

## 2022-03-26 LAB — RAD ONC ARIA SESSION SUMMARY
Course Elapsed Days: 41
Plan Fractions Treated to Date: 26
Plan Fractions Treated to Date: 26
Plan Prescribed Dose Per Fraction: 1.8 Gy
Plan Prescribed Dose Per Fraction: 1.8 Gy
Plan Total Fractions Prescribed: 28
Plan Total Fractions Prescribed: 28
Plan Total Prescribed Dose: 50.4 Gy
Plan Total Prescribed Dose: 50.4 Gy
Reference Point Dosage Given to Date: 46.8 Gy
Reference Point Dosage Given to Date: 46.8 Gy
Reference Point Session Dosage Given: 1.8 Gy
Reference Point Session Dosage Given: 1.8 Gy
Session Number: 26

## 2022-03-26 NOTE — Progress Notes (Signed)
Patient asked to be seen by provider for warm/swollen/red area along incision in left axilla. Dr. Sondra Come assessed area since Dr. Lisbeth Renshaw is out of the office. Dr. Sondra Come instructed to reach out to surgeon's office (Dr. Brantley Stage) to see if a provider at the practice could evaluate area.   Called and spoke with triage nurse at [facility] who stated there was currently no MD/PA in the clinic, but one would be coming in this afternoon. CCS-nurse will update surgeon once they are in the office, and then call patient with an update/recommendation. Confirmed with nurse that they had patient's cell phone number to contact directly.   Patient undated on plan and confirmed she would have her cell phone nearby for when [facility] office called

## 2022-03-27 ENCOUNTER — Telehealth: Payer: Self-pay

## 2022-03-27 ENCOUNTER — Ambulatory Visit: Payer: Medicare Other

## 2022-03-27 ENCOUNTER — Ambulatory Visit: Payer: Medicare Other | Admitting: Radiation Oncology

## 2022-03-27 ENCOUNTER — Inpatient Hospital Stay: Payer: Medicare Other

## 2022-03-27 NOTE — Telephone Encounter (Signed)
Amber Mccormick called requesting appointments for 03/27/2022 and 03/28/2022 be cancelled.  She reports that after radiation treatment yesterday 03/26/2022 she was having some pain and swelling so she went in to see her general surgeon (Dr. Barry Dienes) who told her she had an infection and would need to cancel radiation treatments for those two days.  RN relayed message to radiation therapists on [facility].  They will cancel appointments and follow up with Amber Mccormick on 04/01/2021.

## 2022-03-28 ENCOUNTER — Ambulatory Visit: Payer: Medicare Other | Admitting: Radiation Oncology

## 2022-03-28 ENCOUNTER — Telehealth: Payer: Self-pay | Admitting: Hematology and Oncology

## 2022-03-28 ENCOUNTER — Ambulatory Visit: Payer: Medicare Other

## 2022-03-28 NOTE — Telephone Encounter (Signed)
Patient called to r/s missed 12/28 infusion. R/s patient's infusion & moved future infusions to correspond to treatment plan.

## 2022-03-29 ENCOUNTER — Other Ambulatory Visit: Payer: Self-pay

## 2022-04-01 ENCOUNTER — Ambulatory Visit: Payer: 59

## 2022-04-01 ENCOUNTER — Inpatient Hospital Stay: Payer: 59

## 2022-04-01 ENCOUNTER — Other Ambulatory Visit: Payer: Self-pay

## 2022-04-01 ENCOUNTER — Ambulatory Visit
Admission: RE | Admit: 2022-04-01 | Discharge: 2022-04-01 | Disposition: A | Discharge status: Home / Self Care | Payer: 59 | Source: Ambulatory Visit | Attending: Radiation Oncology | Admitting: Radiation Oncology

## 2022-04-01 ENCOUNTER — Inpatient Hospital Stay: Payer: 59 | Attending: Hematology and Oncology

## 2022-04-01 VITALS — BP 133/76 | HR 83 | Temp 97.6°F | Resp 18 | Wt 150.8 lb

## 2022-04-01 DIAGNOSIS — Z170 Estrogen receptor positive status [ER+]: Secondary | ICD-10-CM | POA: Diagnosis present

## 2022-04-01 DIAGNOSIS — C50412 Malignant neoplasm of upper-outer quadrant of left female breast: Secondary | ICD-10-CM

## 2022-04-01 DIAGNOSIS — Z95828 Presence of other vascular implants and grafts: Secondary | ICD-10-CM

## 2022-04-01 DIAGNOSIS — Z5112 Encounter for antineoplastic immunotherapy: Secondary | ICD-10-CM | POA: Insufficient documentation

## 2022-04-01 LAB — RAD ONC ARIA SESSION SUMMARY
Course Elapsed Days: 47
Plan Fractions Treated to Date: 27
Plan Fractions Treated to Date: 27
Plan Prescribed Dose Per Fraction: 1.8 Gy
Plan Prescribed Dose Per Fraction: 1.8 Gy
Plan Total Fractions Prescribed: 28
Plan Total Fractions Prescribed: 28
Plan Total Prescribed Dose: 50.4 Gy
Plan Total Prescribed Dose: 50.4 Gy
Reference Point Dosage Given to Date: 48.6 Gy
Reference Point Dosage Given to Date: 48.6 Gy
Reference Point Session Dosage Given: 1.8 Gy
Reference Point Session Dosage Given: 1.8 Gy
Session Number: 27

## 2022-04-01 LAB — CMP (CANCER CENTER ONLY)
ALT: 12 U/L (ref 0–44)
AST: 22 U/L (ref 15–41)
Albumin: 3.7 g/dL (ref 3.5–5.0)
Alkaline Phosphatase: 105 U/L (ref 38–126)
Anion gap: 6 (ref 5–15)
BUN: 9 mg/dL (ref 8–23)
CO2: 31 mmol/L (ref 22–32)
Calcium: 10.1 mg/dL (ref 8.9–10.3)
Chloride: 103 mmol/L (ref 98–111)
Creatinine: 0.59 mg/dL (ref 0.44–1.00)
GFR, Estimated: >60 mL/min (ref >60)
Glucose, Bld: 83 mg/dL (ref 70–99)
Potassium: 3.7 mmol/L (ref 3.5–5.1)
Sodium: 140 mmol/L (ref 135–145)
Total Bilirubin: 0.4 mg/dL (ref 0.3–1.2)
Total Protein: 7.3 g/dL (ref 6.5–8.1)

## 2022-04-01 LAB — CBC WITH DIFFERENTIAL (CANCER CENTER ONLY)
Abs Immature Granulocytes: 0.01 10*3/uL (ref 0.00–0.07)
Basophils Absolute: 0 10*3/uL (ref 0.0–0.1)
Basophils Relative: 0 %
Eosinophils Absolute: 0.2 10*3/uL (ref 0.0–0.5)
Eosinophils Relative: 3 %
HCT: 39.3 % (ref 36.0–46.0)
Hemoglobin: 13.1 g/dL (ref 12.0–15.0)
Immature Granulocytes: 0 %
Lymphocytes Relative: 19 %
Lymphs Abs: 0.9 10*3/uL (ref 0.7–4.0)
MCH: 29.2 pg (ref 26.0–34.0)
MCHC: 33.3 g/dL (ref 30.0–36.0)
MCV: 87.7 fL (ref 80.0–100.0)
Monocytes Absolute: 0.5 10*3/uL (ref 0.1–1.0)
Monocytes Relative: 11 %
Neutro Abs: 3.2 10*3/uL (ref 1.7–7.7)
Neutrophils Relative %: 67 %
Platelet Count: 193 10*3/uL (ref 150–400)
RBC: 4.48 MIL/uL (ref 3.87–5.11)
RDW: 14.4 % (ref 11.5–15.5)
WBC Count: 4.9 10*3/uL (ref 4.0–10.5)
nRBC: 0 % (ref 0.0–0.2)

## 2022-04-01 MED ORDER — DIPHENHYDRAMINE HCL 25 MG PO CAPS
25.0000 mg | ORAL_CAPSULE | Freq: Once | ORAL | Status: AC
  Administered 2022-04-01: 25 mg via ORAL
  Filled 2022-04-01: qty 1

## 2022-04-01 MED ORDER — PROCHLORPERAZINE MALEATE 10 MG PO TABS
10.0000 mg | ORAL_TABLET | Freq: Once | ORAL | Status: AC
  Administered 2022-04-01: 10 mg via ORAL
  Filled 2022-04-01: qty 1

## 2022-04-01 MED ORDER — HEPARIN SOD (PORK) LOCK FLUSH 100 UNIT/ML IV SOLN
500.0000 [IU] | Freq: Once | INTRAVENOUS | Status: AC | PRN
  Administered 2022-04-01: 500 [IU]

## 2022-04-01 MED ORDER — ACETAMINOPHEN 325 MG PO TABS
650.0000 mg | ORAL_TABLET | Freq: Once | ORAL | Status: AC
  Administered 2022-04-01: 650 mg via ORAL
  Filled 2022-04-01: qty 2

## 2022-04-01 MED ORDER — SODIUM CHLORIDE 0.9% FLUSH
10.0000 mL | INTRAVENOUS | Status: DC | PRN
  Administered 2022-04-01: 10 mL

## 2022-04-01 MED ORDER — SODIUM CHLORIDE 0.9 % IV SOLN
3.6000 mg/kg | Freq: Once | INTRAVENOUS | Status: AC
  Administered 2022-04-01: 260 mg via INTRAVENOUS
  Filled 2022-04-01: qty 8

## 2022-04-01 MED ORDER — SODIUM CHLORIDE 0.9% FLUSH
10.0000 mL | Freq: Once | INTRAVENOUS | Status: AC
  Administered 2022-04-01: 10 mL

## 2022-04-01 MED ORDER — SODIUM CHLORIDE 0.9 % IV SOLN: Freq: Once | INTRAVENOUS | Status: AC

## 2022-04-01 NOTE — Progress Notes (Signed)
Patient wound and antibiotics were discussed with Dr. Lindi Adie prior to treatment and ok to treat today.

## 2022-04-01 NOTE — Patient Instructions (Signed)
[facility]  Discharge Instructions: Thank you for choosing [facility] to provide your oncology and hematology care.   If you have a lab appointment with the [facility], please go directly to the [facility] and check in at the registration area.   Wear comfortable clothing and clothing appropriate for easy access to any Portacath or PICC line.   We strive to give you quality time with your provider. You may need to reschedule your appointment if you arrive late (15 or more minutes).  Arriving late affects you and other patients whose appointments are after yours.  Also, if you miss three or more appointments without notifying the office, you may be dismissed from the clinic at the provider's discretion.      For prescription refill requests, have your pharmacy contact our office and allow 72 hours for refills to be completed.    Today you received the following chemotherapy and/or immunotherapy agents: ado-trastuzumab-emtansine (Kadcyla)      To help prevent nausea and vomiting after your treatment, we encourage you to take your nausea medication as directed.  BELOW ARE SYMPTOMS THAT SHOULD BE REPORTED IMMEDIATELY: *FEVER GREATER THAN 100.4 F (38 C) OR HIGHER *CHILLS OR SWEATING *NAUSEA AND VOMITING THAT IS NOT CONTROLLED WITH YOUR NAUSEA MEDICATION *UNUSUAL SHORTNESS OF BREATH *UNUSUAL BRUISING OR BLEEDING *URINARY PROBLEMS (pain or burning when urinating, or frequent urination) *BOWEL PROBLEMS (unusual diarrhea, constipation, pain near the anus) TENDERNESS IN MOUTH AND THROAT WITH OR WITHOUT PRESENCE OF ULCERS (sore throat, sores in mouth, or a toothache) UNUSUAL RASH, SWELLING OR PAIN  UNUSUAL VAGINAL DISCHARGE OR ITCHING   Items with * indicate a potential emergency and should be followed up as soon as possible or go to the Emergency Department if any problems should occur.  Please show the CHEMOTHERAPY ALERT CARD or IMMUNOTHERAPY  ALERT CARD at check-in to the Emergency Department and triage nurse.  Should you have questions after your visit or need to cancel or reschedule your appointment, please contact [facility]  Dept: [phone]  and follow the prompts.  Office hours are 8:00 a.m. to 4:30 p.m. Monday - Friday. Please note that voicemails left after 4:00 p.m. may not be returned until the following business day.  We are closed weekends and major holidays. You have access to a nurse at all times for urgent questions. Please call the main number to the clinic Dept: [phone] and follow the prompts.   For any non-urgent questions, you may also contact your provider using MyChart. We now offer e-Visits for anyone 57 and older to request care online for non-urgent symptoms. For details visit [email or web address].   Also download the MyChart app! Go to the app store, search "MyChart", open the app, select [facility], and log in with your MyChart username and password.  Masks are optional in the cancer centers. If you would like for your care team to wear a mask while they are taking care of you, please let them know. You may have one support person who is at least 69 years old accompany you for your appointments.

## 2022-04-02 ENCOUNTER — Other Ambulatory Visit: Payer: Self-pay

## 2022-04-02 ENCOUNTER — Ambulatory Visit: Payer: 59 | Admitting: Radiation Oncology

## 2022-04-02 ENCOUNTER — Ambulatory Visit
Admission: RE | Admit: 2022-04-02 | Discharge: 2022-04-02 | Disposition: A | Discharge status: Home / Self Care | Payer: 59 | Source: Ambulatory Visit | Attending: Radiation Oncology | Admitting: Radiation Oncology

## 2022-04-02 ENCOUNTER — Ambulatory Visit: Payer: 59

## 2022-04-02 DIAGNOSIS — C50412 Malignant neoplasm of upper-outer quadrant of left female breast: Secondary | ICD-10-CM | POA: Diagnosis not present

## 2022-04-02 LAB — RAD ONC ARIA SESSION SUMMARY
Course Elapsed Days: 48
Plan Fractions Treated to Date: 28
Plan Fractions Treated to Date: 28
Plan Prescribed Dose Per Fraction: 1.8 Gy
Plan Prescribed Dose Per Fraction: 1.8 Gy
Plan Total Fractions Prescribed: 28
Plan Total Fractions Prescribed: 28
Plan Total Prescribed Dose: 50.4 Gy
Plan Total Prescribed Dose: 50.4 Gy
Reference Point Dosage Given to Date: 50.4 Gy
Reference Point Dosage Given to Date: 50.4 Gy
Reference Point Session Dosage Given: 1.8 Gy
Reference Point Session Dosage Given: 1.8 Gy
Session Number: 28

## 2022-04-03 ENCOUNTER — Other Ambulatory Visit: Payer: Self-pay

## 2022-04-03 ENCOUNTER — Ambulatory Visit: Payer: 59

## 2022-04-03 ENCOUNTER — Ambulatory Visit
Admission: RE | Admit: 2022-04-03 | Discharge: 2022-04-03 | Disposition: A | Discharge status: Home / Self Care | Payer: 59 | Source: Ambulatory Visit | Attending: Radiation Oncology | Admitting: Radiation Oncology

## 2022-04-03 DIAGNOSIS — C50412 Malignant neoplasm of upper-outer quadrant of left female breast: Secondary | ICD-10-CM | POA: Diagnosis not present

## 2022-04-03 LAB — RAD ONC ARIA SESSION SUMMARY
Course Elapsed Days: 49
Plan Fractions Treated to Date: 1
Plan Prescribed Dose Per Fraction: 2 Gy
Plan Total Fractions Prescribed: 5
Plan Total Prescribed Dose: 10 Gy
Reference Point Dosage Given to Date: 2 Gy
Reference Point Session Dosage Given: 2 Gy
Session Number: 29

## 2022-04-04 ENCOUNTER — Ambulatory Visit: Payer: 59

## 2022-04-04 ENCOUNTER — Ambulatory Visit
Admission: RE | Admit: 2022-04-04 | Discharge: 2022-04-04 | Disposition: A | Discharge status: Home / Self Care | Payer: 59 | Source: Ambulatory Visit | Attending: Radiation Oncology | Admitting: Radiation Oncology

## 2022-04-04 ENCOUNTER — Other Ambulatory Visit: Payer: Self-pay

## 2022-04-04 DIAGNOSIS — C50412 Malignant neoplasm of upper-outer quadrant of left female breast: Secondary | ICD-10-CM | POA: Diagnosis not present

## 2022-04-04 LAB — RAD ONC ARIA SESSION SUMMARY
Course Elapsed Days: 50
Plan Fractions Treated to Date: 2
Plan Prescribed Dose Per Fraction: 2 Gy
Plan Total Fractions Prescribed: 5
Plan Total Prescribed Dose: 10 Gy
Reference Point Dosage Given to Date: 4 Gy
Reference Point Session Dosage Given: 2 Gy
Session Number: 30

## 2022-04-07 ENCOUNTER — Ambulatory Visit
Admission: RE | Admit: 2022-04-07 | Discharge: 2022-04-07 | Disposition: A | Discharge status: Home / Self Care | Payer: 59 | Source: Ambulatory Visit | Attending: Radiation Oncology | Admitting: Radiation Oncology

## 2022-04-07 ENCOUNTER — Other Ambulatory Visit: Payer: Self-pay

## 2022-04-07 ENCOUNTER — Ambulatory Visit: Payer: 59

## 2022-04-07 DIAGNOSIS — C50412 Malignant neoplasm of upper-outer quadrant of left female breast: Secondary | ICD-10-CM | POA: Diagnosis not present

## 2022-04-07 LAB — RAD ONC ARIA SESSION SUMMARY
Course Elapsed Days: 53
Plan Fractions Treated to Date: 3
Plan Prescribed Dose Per Fraction: 2 Gy
Plan Total Fractions Prescribed: 5
Plan Total Prescribed Dose: 10 Gy
Reference Point Dosage Given to Date: 6 Gy
Reference Point Session Dosage Given: 2 Gy
Session Number: 31

## 2022-04-08 ENCOUNTER — Ambulatory Visit: Payer: 59

## 2022-04-09 ENCOUNTER — Ambulatory Visit: Payer: 59

## 2022-04-09 ENCOUNTER — Other Ambulatory Visit: Payer: Self-pay

## 2022-04-09 ENCOUNTER — Ambulatory Visit
Admission: RE | Admit: 2022-04-09 | Discharge: 2022-04-09 | Disposition: A | Discharge status: Home / Self Care | Payer: 59 | Source: Ambulatory Visit | Attending: Radiation Oncology | Admitting: Radiation Oncology

## 2022-04-09 DIAGNOSIS — C50412 Malignant neoplasm of upper-outer quadrant of left female breast: Secondary | ICD-10-CM | POA: Diagnosis not present

## 2022-04-09 LAB — RAD ONC ARIA SESSION SUMMARY
Course Elapsed Days: 55
Plan Fractions Treated to Date: 4
Plan Prescribed Dose Per Fraction: 2 Gy
Plan Total Fractions Prescribed: 5
Plan Total Prescribed Dose: 10 Gy
Reference Point Dosage Given to Date: 8 Gy
Reference Point Session Dosage Given: 2 Gy
Session Number: 32

## 2022-04-10 ENCOUNTER — Other Ambulatory Visit: Payer: Self-pay

## 2022-04-10 ENCOUNTER — Encounter: Payer: Self-pay | Admitting: Radiation Oncology

## 2022-04-10 ENCOUNTER — Ambulatory Visit
Admission: RE | Admit: 2022-04-10 | Discharge: 2022-04-10 | Disposition: A | Discharge status: Home / Self Care | Payer: 59 | Source: Ambulatory Visit | Attending: Radiation Oncology | Admitting: Radiation Oncology

## 2022-04-10 ENCOUNTER — Ambulatory Visit: Payer: 59

## 2022-04-10 DIAGNOSIS — C50412 Malignant neoplasm of upper-outer quadrant of left female breast: Secondary | ICD-10-CM

## 2022-04-10 LAB — RAD ONC ARIA SESSION SUMMARY
Course Elapsed Days: 56
Plan Fractions Treated to Date: 5
Plan Prescribed Dose Per Fraction: 2 Gy
Plan Total Fractions Prescribed: 5
Plan Total Prescribed Dose: 10 Gy
Reference Point Dosage Given to Date: 10 Gy
Reference Point Session Dosage Given: 2 Gy
Session Number: 33

## 2022-04-10 MED ORDER — RADIAPLEXRX EX GEL: Freq: Once | CUTANEOUS | Status: AC

## 2022-04-11 ENCOUNTER — Other Ambulatory Visit: Payer: Self-pay

## 2022-04-11 ENCOUNTER — Ambulatory Visit: Payer: 59

## 2022-04-14 ENCOUNTER — Ambulatory Visit: Payer: 59

## 2022-04-14 ENCOUNTER — Encounter: Payer: Self-pay | Admitting: Hematology and Oncology

## 2022-04-14 NOTE — Progress Notes (Signed)
                                                                                                                                                             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 She developed fatigue and anticipated skin changes in the treatment field.   Plan: The patient will receive a call in about one month from the radiation oncology department. She will continue follow up with Dr. Lindi Adie as well.   ________________________________________________    Carola Rhine, [facility]

## 2022-04-16 ENCOUNTER — Encounter: Payer: Self-pay | Admitting: Hematology and Oncology

## 2022-04-16 ENCOUNTER — Telehealth: Payer: Self-pay | Admitting: Hematology and Oncology

## 2022-04-16 NOTE — Telephone Encounter (Signed)
Scheduled appointments per WQ. Patient is aware of all made appointments. 

## 2022-04-17 ENCOUNTER — Ambulatory Visit: Payer: Medicare Other | Admitting: Hematology and Oncology

## 2022-04-17 ENCOUNTER — Ambulatory Visit: Payer: Medicare Other

## 2022-04-17 ENCOUNTER — Other Ambulatory Visit: Payer: Medicare Other

## 2022-04-18 ENCOUNTER — Other Ambulatory Visit: Payer: Self-pay

## 2022-04-21 ENCOUNTER — Ambulatory Visit: Payer: 59

## 2022-04-21 ENCOUNTER — Other Ambulatory Visit: Payer: Self-pay

## 2022-04-23 NOTE — Assessment & Plan Note (Signed)
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 Neoadjuvant chemotherapy with TCHP x6 completed 12/05/2021 followed by Kadcyla maintenance to start 01/23/2022. 2. 01/01/2022: Left lumpectomy: Grade 2 IDC 1.5 cm with intermediate grade DCIS, margins negative, lymphovascular invasion identified, 2/6 lymph nodes positive ER 90%, PR 90%, Ki-67 5%, HER2 1+ 3. Followed by adjuvant radiation therapy 4.  Followed by adjuvant antiestrogen therapy -------------------------------------------------------------------------------------------------------------------------------------- Current Treatment: Kadcyla (declined COMPASS HER 2 trial) started 01/22/2022 today cycle 4 Labs reviewed: Normal   Kadcyla toxicities: Intermittent loose stools Slight fatigue   RTC in 3 weeks for Kadcyla maintenance

## 2022-04-24 ENCOUNTER — Inpatient Hospital Stay: Payer: 59

## 2022-04-24 ENCOUNTER — Encounter: Payer: Self-pay | Admitting: *Deleted

## 2022-04-24 ENCOUNTER — Other Ambulatory Visit: Payer: Self-pay

## 2022-04-24 ENCOUNTER — Inpatient Hospital Stay (HOSPITAL_BASED_OUTPATIENT_CLINIC_OR_DEPARTMENT_OTHER): Payer: 59 | Admitting: Hematology and Oncology

## 2022-04-24 VITALS — BP 141/75 | HR 82 | Temp 97.5°F | Resp 16 | Wt 150.5 lb

## 2022-04-24 DIAGNOSIS — Z170 Estrogen receptor positive status [ER+]: Secondary | ICD-10-CM | POA: Insufficient documentation

## 2022-04-24 DIAGNOSIS — Z95828 Presence of other vascular implants and grafts: Secondary | ICD-10-CM

## 2022-04-24 DIAGNOSIS — C50412 Malignant neoplasm of upper-outer quadrant of left female breast: Secondary | ICD-10-CM | POA: Diagnosis present

## 2022-04-24 DIAGNOSIS — Z5112 Encounter for antineoplastic immunotherapy: Secondary | ICD-10-CM | POA: Diagnosis not present

## 2022-04-24 LAB — CBC WITH DIFFERENTIAL (CANCER CENTER ONLY)
Abs Immature Granulocytes: 0.01 10*3/uL (ref 0.00–0.07)
Basophils Absolute: 0 10*3/uL (ref 0.0–0.1)
Basophils Relative: 1 %
Eosinophils Absolute: 0.2 10*3/uL (ref 0.0–0.5)
Eosinophils Relative: 4 %
HCT: 38.7 % (ref 36.0–46.0)
Hemoglobin: 12.9 g/dL (ref 12.0–15.0)
Immature Granulocytes: 0 %
Lymphocytes Relative: 25 %
Lymphs Abs: 1 10*3/uL (ref 0.7–4.0)
MCH: 29.1 pg (ref 26.0–34.0)
MCHC: 33.3 g/dL (ref 30.0–36.0)
MCV: 87.4 fL (ref 80.0–100.0)
Monocytes Absolute: 0.5 10*3/uL (ref 0.1–1.0)
Monocytes Relative: 12 %
Neutro Abs: 2.3 10*3/uL (ref 1.7–7.7)
Neutrophils Relative %: 58 %
Platelet Count: 150 10*3/uL (ref 150–400)
RBC: 4.43 MIL/uL (ref 3.87–5.11)
RDW: 15.7 % — ABNORMAL HIGH (ref 11.5–15.5)
WBC Count: 4 10*3/uL (ref 4.0–10.5)
nRBC: 0 % (ref 0.0–0.2)

## 2022-04-24 LAB — CMP (CANCER CENTER ONLY)
ALT: 16 U/L (ref 0–44)
AST: 28 U/L (ref 15–41)
Albumin: 3.5 g/dL (ref 3.5–5.0)
Alkaline Phosphatase: 116 U/L (ref 38–126)
Anion gap: 4 — ABNORMAL LOW (ref 5–15)
BUN: 13 mg/dL (ref 8–23)
CO2: 32 mmol/L (ref 22–32)
Calcium: 9.4 mg/dL (ref 8.9–10.3)
Chloride: 103 mmol/L (ref 98–111)
Creatinine: 0.66 mg/dL (ref 0.44–1.00)
GFR, Estimated: >60 mL/min (ref >60)
Glucose, Bld: 78 mg/dL (ref 70–99)
Potassium: 3.8 mmol/L (ref 3.5–5.1)
Sodium: 139 mmol/L (ref 135–145)
Total Bilirubin: 0.4 mg/dL (ref 0.3–1.2)
Total Protein: 7 g/dL (ref 6.5–8.1)

## 2022-04-24 MED ORDER — SODIUM CHLORIDE 0.9 % IV SOLN
3.6000 mg/kg | Freq: Once | INTRAVENOUS | Status: AC
  Administered 2022-04-24: 260 mg via INTRAVENOUS
  Filled 2022-04-24: qty 13

## 2022-04-24 MED ORDER — DIPHENHYDRAMINE HCL 25 MG PO CAPS
25.0000 mg | ORAL_CAPSULE | Freq: Once | ORAL | Status: AC
  Administered 2022-04-24: 25 mg via ORAL
  Filled 2022-04-24: qty 1

## 2022-04-24 MED ORDER — SODIUM CHLORIDE 0.9 % IV SOLN: Freq: Once | INTRAVENOUS | Status: AC

## 2022-04-24 MED ORDER — SODIUM CHLORIDE 0.9% FLUSH
10.0000 mL | Freq: Once | INTRAVENOUS | Status: AC
  Administered 2022-04-24: 10 mL

## 2022-04-24 MED ORDER — SODIUM CHLORIDE 0.9% FLUSH
10.0000 mL | INTRAVENOUS | Status: DC | PRN
  Administered 2022-04-24: 10 mL

## 2022-04-24 MED ORDER — ACETAMINOPHEN 325 MG PO TABS
650.0000 mg | ORAL_TABLET | Freq: Once | ORAL | Status: AC
  Administered 2022-04-24: 650 mg via ORAL
  Filled 2022-04-24: qty 2

## 2022-04-24 MED ORDER — PROCHLORPERAZINE MALEATE 10 MG PO TABS
10.0000 mg | ORAL_TABLET | Freq: Once | ORAL | Status: AC
  Administered 2022-04-24: 10 mg via ORAL
  Filled 2022-04-24: qty 1

## 2022-04-24 MED ORDER — HEPARIN SOD (PORK) LOCK FLUSH 100 UNIT/ML IV SOLN
500.0000 [IU] | Freq: Once | INTRAVENOUS | Status: AC | PRN
  Administered 2022-04-24: 500 [IU]

## 2022-04-24 NOTE — Progress Notes (Signed)
Patient Care Team: Kathreen Devoid, PA-C as PCP - Philomena Doheny, Paulette Blanch, RN as Oncology Nurse Navigator Rockwell Germany, RN as Oncology Nurse Navigator Nicholas Lose, MD as Consulting Physician (Hematology and Oncology) Minus Breeding, MD as Consulting Physician (Cardiology) Paralee Cancel, MD as Consulting Physician (Orthopedic Surgery)  DIAGNOSIS:  Encounter Diagnosis  Name Primary?   Malignant neoplasm of upper-outer quadrant of left breast in female, estrogen receptor positive ([place]) Yes    SUMMARY OF ONCOLOGIC HISTORY: Oncology History  Malignant neoplasm of upper-outer quadrant of left breast, estrogen receptor positive ([place])  07/01/2021 Initial Diagnosis   Screening mammogram 06/27/2021 detected abnormality in the left breast which led to diagnostic mammogram and left breast ultrasound that revealed hypoechoic mass with irregular borders 2:30 position 2 cm left axilla: 1 abnormal lymph node, grade 2 IDC with DCIS ER 99%, PR 95%, HER2 2+ by IHC, FISH positive, Ki-67 20%, left axilla lymph node: Positive   08/08/2021 Cancer Staging   Staging form: Breast, AJCC 8th Edition - Clinical stage from 08/08/2021: Stage IB (cT1c, cN1(f), cM0, G2, ER+, PR+, HER2+) - Signed by Nicholas Lose, MD on 08/08/2021 Stage prefix: Initial diagnosis Method of lymph node assessment: Core biopsy Histologic grading system: 3 grade system   08/14/2021 - 12/26/2021 Chemotherapy   Patient is on Treatment Plan : BREAST  Docetaxel + Carboplatin + Trastuzumab + Pertuzumab  (TCHP) q21d / Trastuzumab + Pertuzumab q21d     08/22/2021 - 11/14/2021 Chemotherapy   Patient is on Treatment Plan : BREAST  Docetaxel + Carboplatin + Trastuzumab + Pertuzumab  (TCHP) q21d      01/22/2022 -  Chemotherapy   Patient is on Treatment Plan : BREAST ADO-Trastuzumab Emtansine (Kadcyla) q21d       CHIEF COMPLIANT: Follow-up left breast on cycle 5 Kadcyla     INTERVAL HISTORY: Amber Mccormick is a 67 y.o with the  above-mentioned left breast cancer currently on kadcyla maintenance. She presents to the clinic for a follow-up. She reports that she is tolerating the [facility] extremely well. She says she had an infection under her left axilla but she got it taking care of.   ALLERGIES:  has No Known Allergies.  MEDICATIONS:  Current Outpatient Medications  Medication Sig Dispense Refill   amitriptyline (ELAVIL) 10 MG tablet Take 10 mg by mouth at bedtime.     amLODipine (NORVASC) 5 MG tablet Take 5 mg by mouth daily.     atorvastatin (LIPITOR) 10 MG tablet Take 10 mg by mouth daily.     cholecalciferol (VITAMIN D3) 25 MCG (1000 UNIT) tablet Take 1,000 Units by mouth daily.     diphenhydrAMINE (BENADRYL) 25 MG tablet Take 25 mg by mouth every 6 (six) hours as needed.     diphenoxylate-atropine (LOMOTIL) 2.5-0.025 MG tablet Take 1 tablet by mouth 4 (four) times daily as needed for diarrhea or loose stools. 30 tablet 3   doxycycline (ADOXA) 100 MG tablet Take 100 mg by mouth 2 (two) times daily. Almost done     ibuprofen (ADVIL) 800 MG tablet Take 1 tablet (800 mg total) by mouth every 8 (eight) hours as needed. 30 tablet 0   levothyroxine (SYNTHROID) 50 MCG tablet Take 50 mcg by mouth daily.     metroNIDAZOLE (METROCREAM) 0.75 % cream Apply topically 2 (two) times daily as needed.     vitamin B-12 (CYANOCOBALAMIN) 500 MCG tablet Take by mouth.     vitamin C (ASCORBIC ACID) 500 MG tablet Take 500 mg by mouth  daily.     No current facility-administered medications for this visit.    PHYSICAL EXAMINATION: ECOG PERFORMANCE STATUS: 1 - Symptomatic but completely ambulatory  Vitals:   04/24/22 1015  BP: (!) 141/75  Pulse: 82  Resp: 16  Temp: (!) 97.5 F (36.4 C)  SpO2: 97%   Filed Weights   04/24/22 1015  Weight: 150 lb 8 oz (68.3 kg)     LABORATORY DATA:  I have reviewed the data as listed    Latest Ref Rng & Units 04/01/2022   10:53 AM 03/05/2022    8:11 AM 02/12/2022    9:00 AM  CMP   Glucose 70 - 99 mg/dL 83  94  126   BUN 8 - 23 mg/dL '9  15  13   '$ Creatinine 0.44 - 1.00 mg/dL 0.59  0.66  0.69   Sodium 135 - 145 mmol/L 140  139  140   Potassium 3.5 - 5.1 mmol/L 3.7  3.8  3.7   Chloride 98 - 111 mmol/L 103  104  104   CO2 22 - 32 mmol/L '31  30  31   '$ Calcium 8.9 - 10.3 mg/dL 10.1  9.4  9.2   Total Protein 6.5 - 8.1 g/dL 7.3  6.8  7.0   Total Bilirubin 0.3 - 1.2 mg/dL 0.4  0.5  0.3   Alkaline Phos 38 - 126 U/L 105  111  108   AST 15 - 41 U/L '22  24  23   '$ ALT 0 - 44 U/L '12  15  16     '$ Lab Results  Component Value Date   WBC 4.0 04/24/2022   HGB 12.9 04/24/2022   HCT 38.7 04/24/2022   MCV 87.4 04/24/2022   PLT 150 04/24/2022   NEUTROABS 2.3 04/24/2022    ASSESSMENT & PLAN:  Malignant neoplasm of upper-outer quadrant of left breast, estrogen receptor positive ([place]) 07/01/2021:Screening mammogram 06/27/2021 detected abnormality in the left breast which led to diagnostic mammogram and left breast ultrasound that revealed hypoechoic mass with irregular borders 2:30 position 2 cm left axilla: 1 abnormal lymph node, 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 Neoadjuvant chemotherapy with TCHP x6 completed 12/05/2021 followed by Kadcyla maintenance to start 01/23/2022. 2. 01/01/2022: Left lumpectomy: Grade 2 IDC 1.5 cm with intermediate grade DCIS, margins negative, lymphovascular invasion identified, 2/6 lymph nodes positive ER 90%, PR 90%, Ki-67 5%, HER2 1+ 3. Followed by adjuvant radiation therapy 4.  Followed by adjuvant antiestrogen therapy -------------------------------------------------------------------------------------------------------------------------------------- Current Treatment: Kadcyla (declined COMPASS HER 2 trial) started 01/22/2022 today cycle 4 Labs reviewed: Normal   Kadcyla toxicities: Intermittent loose stools Slight fatigue   RTC in 3 weeks for Kadcyla  maintenance       No orders of the defined types were placed in this encounter.  The patient has a good understanding of the overall plan. she agrees with it. she will call with any problems that may develop before the next visit here. Total time spent: 30 mins including face to face time and time spent for planning, charting and co-ordination of care   Harriette Ohara, MD 04/24/22    I Gardiner Coins am acting as a Education administrator for [employer]  I have reviewed the above documentation for accuracy and completeness, and I agree with the above.

## 2022-04-24 NOTE — Patient Instructions (Signed)
[facility]  Discharge Instructions: Thank you for choosing [facility] to provide your oncology and hematology care.   If you have a lab appointment with the Cancer Center, please go directly to the Cancer Center and check in at the registration area.   Wear comfortable clothing and clothing appropriate for easy access to any Portacath or PICC line.   We strive to give you quality time with your provider. You may need to reschedule your appointment if you arrive late (15 or more minutes).  Arriving late affects you and other patients whose appointments are after yours.  Also, if you miss three or more appointments without notifying the office, you may be dismissed from the clinic at the provider's discretion.      For prescription refill requests, have your pharmacy contact our office and allow 72 hours for refills to be completed.    Today you received the following chemotherapy and/or immunotherapy agents Kadcyla      To help prevent nausea and vomiting after your treatment, we encourage you to take your nausea medication as directed.  BELOW ARE SYMPTOMS THAT SHOULD BE REPORTED IMMEDIATELY: *FEVER GREATER THAN 100.4 F (38 C) OR HIGHER *CHILLS OR SWEATING *NAUSEA AND VOMITING THAT IS NOT CONTROLLED WITH YOUR NAUSEA MEDICATION *UNUSUAL SHORTNESS OF BREATH *UNUSUAL BRUISING OR BLEEDING *URINARY PROBLEMS (pain or burning when urinating, or frequent urination) *BOWEL PROBLEMS (unusual diarrhea, constipation, pain near the anus) TENDERNESS IN MOUTH AND THROAT WITH OR WITHOUT PRESENCE OF ULCERS (sore throat, sores in mouth, or a toothache) UNUSUAL RASH, SWELLING OR PAIN  UNUSUAL VAGINAL DISCHARGE OR ITCHING   Items with * indicate a potential emergency and should be followed up as soon as possible or go to the Emergency Department if any problems should occur.  Please show the CHEMOTHERAPY ALERT CARD or IMMUNOTHERAPY ALERT CARD at check-in  to the Emergency Department and triage nurse.  Should you have questions after your visit or need to cancel or reschedule your appointment, please contact [facility]  Dept: [phone]  and follow the prompts.  Office hours are 8:00 a.m. to 4:30 p.m. Monday - Friday. Please note that voicemails left after 4:00 p.m. may not be returned until the following business day.  We are closed weekends and major holidays. You have access to a nurse at all times for urgent questions. Please call the main number to the clinic Dept: [phone] and follow the prompts.   For any non-urgent questions, you may also contact your provider using MyChart. We now offer e-Visits for anyone 18 and older to request care online for non-urgent symptoms. For details visit [email or web address].   Also download the MyChart app! Go to the app store, search "MyChart", open the app, select [place], and log in with your MyChart username and password.  

## 2022-05-02 ENCOUNTER — Encounter: Payer: Self-pay | Admitting: Hematology and Oncology

## 2022-05-14 ENCOUNTER — Other Ambulatory Visit: Payer: Self-pay

## 2022-05-14 NOTE — Assessment & Plan Note (Deleted)
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 Neoadjuvant chemotherapy with TCHP x6 completed 12/05/2021 followed by Kadcyla maintenance to start 01/23/2022. 2. 01/01/2022: Left lumpectomy: Grade 2 IDC 1.5 cm with intermediate grade DCIS, margins negative, lymphovascular invasion identified, 2/6 lymph nodes positive ER 90%, PR 90%, Ki-67 5%, HER2 1+ 3. Followed by adjuvant radiation therapy 4.  Followed by adjuvant antiestrogen therapy -------------------------------------------------------------------------------------------------------------------------------------- Current Treatment: Kadcyla (declined COMPASS HER 2 trial) started 01/22/2022 today cycle 6 Labs reviewed: Normal   Kadcyla toxicities: Intermittent loose stools Slight fatigue   RTC in 3 weeks for Kadcyla maintenance

## 2022-05-15 ENCOUNTER — Inpatient Hospital Stay: Payer: 59

## 2022-05-15 ENCOUNTER — Inpatient Hospital Stay: Payer: 59 | Admitting: Hematology and Oncology

## 2022-05-15 DIAGNOSIS — Z170 Estrogen receptor positive status [ER+]: Secondary | ICD-10-CM

## 2022-05-15 NOTE — Progress Notes (Incomplete)
Patient Care Team: Kathreen Devoid, PA-C as PCP - Philomena Doheny, Paulette Blanch, RN as Oncology Nurse Navigator Rockwell Germany, RN as Oncology Nurse Navigator Nicholas Lose, MD as Consulting Physician (Hematology and Oncology) Minus Breeding, MD as Consulting Physician (Cardiology) Paralee Cancel, MD as Consulting Physician (Orthopedic Surgery)  DIAGNOSIS:  Encounter Diagnosis  Name Primary?   Malignant neoplasm of upper-outer quadrant of left breast in female, estrogen receptor positive ([place]) Yes    SUMMARY OF ONCOLOGIC HISTORY: Oncology History  Malignant neoplasm of upper-outer quadrant of left breast, estrogen receptor positive ([place])  07/01/2021 Initial Diagnosis   Screening mammogram 06/27/2021 detected abnormality in the left breast which led to diagnostic mammogram and left breast ultrasound that revealed hypoechoic mass with irregular borders 2:30 position 2 cm left axilla: 1 abnormal lymph node, grade 2 IDC with DCIS ER 99%, PR 95%, HER2 2+ by IHC, FISH positive, Ki-67 20%, left axilla lymph node: Positive   08/08/2021 Cancer Staging   Staging form: Breast, AJCC 8th Edition - Clinical stage from 08/08/2021: Stage IB (cT1c, cN1(f), cM0, G2, ER+, PR+, HER2+) - Signed by Nicholas Lose, MD on 08/08/2021 Stage prefix: Initial diagnosis Method of lymph node assessment: Core biopsy Histologic grading system: 3 grade system   08/14/2021 - 12/26/2021 Chemotherapy   Patient is on Treatment Plan : BREAST  Docetaxel + Carboplatin + Trastuzumab + Pertuzumab  (TCHP) q21d / Trastuzumab + Pertuzumab q21d     08/22/2021 - 11/14/2021 Chemotherapy   Patient is on Treatment Plan : BREAST  Docetaxel + Carboplatin + Trastuzumab + Pertuzumab  (TCHP) q21d      01/22/2022 -  Chemotherapy   Patient is on Treatment Plan : BREAST ADO-Trastuzumab Emtansine (Kadcyla) q21d       CHIEF COMPLIANT: Follow-up left breast on cycle 6 Kadcyla     INTERVAL HISTORY: Amber Mccormick is a 67 y.o with the  above-mentioned left breast cancer currently on kadcyla maintenance. She presents to the clinic for a follow-up.    ALLERGIES:  has No Known Allergies.  MEDICATIONS:  Current Outpatient Medications  Medication Sig Dispense Refill   amitriptyline (ELAVIL) 10 MG tablet Take 10 mg by mouth at bedtime.     amLODipine (NORVASC) 5 MG tablet Take 5 mg by mouth daily.     atorvastatin (LIPITOR) 10 MG tablet Take 10 mg by mouth daily.     cholecalciferol (VITAMIN D3) 25 MCG (1000 UNIT) tablet Take 1,000 Units by mouth daily.     diphenhydrAMINE (BENADRYL) 25 MG tablet Take 25 mg by mouth every 6 (six) hours as needed.     diphenoxylate-atropine (LOMOTIL) 2.5-0.025 MG tablet Take 1 tablet by mouth 4 (four) times daily as needed for diarrhea or loose stools. 30 tablet 3   doxycycline (ADOXA) 100 MG tablet Take 100 mg by mouth 2 (two) times daily. Almost done     ibuprofen (ADVIL) 800 MG tablet Take 1 tablet (800 mg total) by mouth every 8 (eight) hours as needed. 30 tablet 0   levothyroxine (SYNTHROID) 50 MCG tablet Take 50 mcg by mouth daily.     metroNIDAZOLE (METROCREAM) 0.75 % cream Apply topically 2 (two) times daily as needed.     vitamin B-12 (CYANOCOBALAMIN) 500 MCG tablet Take by mouth.     vitamin C (ASCORBIC ACID) 500 MG tablet Take 500 mg by mouth daily.     No current facility-administered medications for this visit.    PHYSICAL EXAMINATION: ECOG PERFORMANCE STATUS: {CHL ONC ECOG [id number]  There  were no vitals filed for this visit. There were no vitals filed for this visit.  BREAST:*** No palpable masses or nodules in either right or left breasts. No palpable axillary supraclavicular or infraclavicular adenopathy no breast tenderness or nipple discharge. (exam performed in the presence of a chaperone)  LABORATORY DATA:  I have reviewed the data as listed    Latest Ref Rng & Units 04/24/2022   10:00 AM 04/01/2022   10:53 AM 03/05/2022    8:11 AM  CMP  Glucose 70 - 99 mg/dL  78  83  94   BUN 8 - 23 mg/dL 13  9  15   $ Creatinine 0.44 - 1.00 mg/dL 0.66  0.59  0.66   Sodium 135 - 145 mmol/L 139  140  139   Potassium 3.5 - 5.1 mmol/L 3.8  3.7  3.8   Chloride 98 - 111 mmol/L 103  103  104   CO2 22 - 32 mmol/L 32  31  30   Calcium 8.9 - 10.3 mg/dL 9.4  10.1  9.4   Total Protein 6.5 - 8.1 g/dL 7.0  7.3  6.8   Total Bilirubin 0.3 - 1.2 mg/dL 0.4  0.4  0.5   Alkaline Phos 38 - 126 U/L 116  105  111   AST 15 - 41 U/L 28  22  24   $ ALT 0 - 44 U/L 16  12  15     $ Lab Results  Component Value Date   WBC 4.0 04/24/2022   HGB 12.9 04/24/2022   HCT 38.7 04/24/2022   MCV 87.4 04/24/2022   PLT 150 04/24/2022   NEUTROABS 2.3 04/24/2022    ASSESSMENT & PLAN:  No problem-specific Assessment & Plan notes found for this encounter.    No orders of the defined types were placed in this encounter.  The patient has a good understanding of the overall plan. she agrees with it. she will call with any problems that may develop before the next visit here. Total time spent: 30 mins including face to face time and time spent for planning, charting and co-ordination of care   Suzzette Righter, [place] 05/15/22    I Gardiner Coins am acting as a Education administrator for [employer]  ***

## 2022-05-21 ENCOUNTER — Other Ambulatory Visit: Payer: Self-pay

## 2022-05-21 NOTE — Progress Notes (Signed)
Patient Care Team: Kathreen Devoid, PA-C as PCP - Philomena Doheny, Paulette Blanch, RN as Oncology Nurse Navigator Rockwell Germany, RN as Oncology Nurse Navigator Nicholas Lose, MD as Consulting Physician (Hematology and Oncology) Minus Breeding, MD as Consulting Physician (Cardiology) Paralee Cancel, MD as Consulting Physician (Orthopedic Surgery)  DIAGNOSIS: No diagnosis found.  SUMMARY OF ONCOLOGIC HISTORY: Oncology History  Malignant neoplasm of upper-outer quadrant of left breast, estrogen receptor positive ([place])  07/01/2021 Initial Diagnosis   Screening mammogram 06/27/2021 detected abnormality in the left breast which led to diagnostic mammogram and left breast ultrasound that revealed hypoechoic mass with irregular borders 2:30 position 2 cm left axilla: 1 abnormal lymph node, grade 2 IDC with DCIS ER 99%, PR 95%, HER2 2+ by IHC, FISH positive, Ki-67 20%, left axilla lymph node: Positive   08/08/2021 Cancer Staging   Staging form: Breast, AJCC 8th Edition - Clinical stage from 08/08/2021: Stage IB (cT1c, cN1(f), cM0, G2, ER+, PR+, HER2+) - Signed by Nicholas Lose, MD on 08/08/2021 Stage prefix: Initial diagnosis Method of lymph node assessment: Core biopsy Histologic grading system: 3 grade system   08/14/2021 - 12/26/2021 Chemotherapy   Patient is on Treatment Plan : BREAST  Docetaxel + Carboplatin + Trastuzumab + Pertuzumab  (TCHP) q21d / Trastuzumab + Pertuzumab q21d     08/22/2021 - 11/14/2021 Chemotherapy   Patient is on Treatment Plan : BREAST  Docetaxel + Carboplatin + Trastuzumab + Pertuzumab  (TCHP) q21d      01/22/2022 -  Chemotherapy   Patient is on Treatment Plan : BREAST ADO-Trastuzumab Emtansine (Kadcyla) q21d       CHIEF COMPLIANT:  Follow-up left breast on Kadcyla maintenance      INTERVAL HISTORY: Amber Mccormick is a 67 y.o with the above-mentioned left breast cancer currently on kadcyla maintenance. She presents to the clinic for a follow-up.    ALLERGIES:   has No Known Allergies.  MEDICATIONS:  Current Outpatient Medications  Medication Sig Dispense Refill   amitriptyline (ELAVIL) 10 MG tablet Take 10 mg by mouth at bedtime.     amLODipine (NORVASC) 5 MG tablet Take 5 mg by mouth daily.     atorvastatin (LIPITOR) 10 MG tablet Take 10 mg by mouth daily.     cholecalciferol (VITAMIN D3) 25 MCG (1000 UNIT) tablet Take 1,000 Units by mouth daily.     diphenhydrAMINE (BENADRYL) 25 MG tablet Take 25 mg by mouth every 6 (six) hours as needed.     diphenoxylate-atropine (LOMOTIL) 2.5-0.025 MG tablet Take 1 tablet by mouth 4 (four) times daily as needed for diarrhea or loose stools. 30 tablet 3   doxycycline (ADOXA) 100 MG tablet Take 100 mg by mouth 2 (two) times daily. Almost done     ibuprofen (ADVIL) 800 MG tablet Take 1 tablet (800 mg total) by mouth every 8 (eight) hours as needed. 30 tablet 0   levothyroxine (SYNTHROID) 50 MCG tablet Take 50 mcg by mouth daily.     metroNIDAZOLE (METROCREAM) 0.75 % cream Apply topically 2 (two) times daily as needed.     vitamin B-12 (CYANOCOBALAMIN) 500 MCG tablet Take by mouth.     vitamin C (ASCORBIC ACID) 500 MG tablet Take 500 mg by mouth daily.     No current facility-administered medications for this visit.    PHYSICAL EXAMINATION: ECOG PERFORMANCE STATUS: {CHL ONC ECOG PS:[id number]}  There were no vitals filed for this visit. There were no vitals filed for this visit.  BREAST:*** No palpable masses or  nodules in either right or left breasts. No palpable axillary supraclavicular or infraclavicular adenopathy no breast tenderness or nipple discharge. (exam performed in the presence of a chaperone)  LABORATORY DATA:  I have reviewed the data as listed    Latest Ref Rng & Units 04/24/2022   10:00 AM 04/01/2022   10:53 AM 03/05/2022    8:11 AM  CMP  Glucose 70 - 99 mg/dL 78  83  94   BUN 8 - 23 mg/dL 13  9  15   $ Creatinine 0.44 - 1.00 mg/dL 0.66  0.59  0.66   Sodium 135 - 145 mmol/L 139  140  139    Potassium 3.5 - 5.1 mmol/L 3.8  3.7  3.8   Chloride 98 - 111 mmol/L 103  103  104   CO2 22 - 32 mmol/L 32  31  30   Calcium 8.9 - 10.3 mg/dL 9.4  10.1  9.4   Total Protein 6.5 - 8.1 g/dL 7.0  7.3  6.8   Total Bilirubin 0.3 - 1.2 mg/dL 0.4  0.4  0.5   Alkaline Phos 38 - 126 U/L 116  105  111   AST 15 - 41 U/L 28  22  24   $ ALT 0 - 44 U/L 16  12  15     $ Lab Results  Component Value Date   WBC 4.0 04/24/2022   HGB 12.9 04/24/2022   HCT 38.7 04/24/2022   MCV 87.4 04/24/2022   PLT 150 04/24/2022   NEUTROABS 2.3 04/24/2022    ASSESSMENT & PLAN:  No problem-specific Assessment & Plan notes found for this encounter.    No orders of the defined types were placed in this encounter.  The patient has a good understanding of the overall plan. she agrees with it. she will call with any problems that may develop before the next visit here. Total time spent: 30 mins including face to face time and time spent for planning, charting and co-ordination of care   Suzzette Righter, [place] 05/21/22    I Gardiner Coins am acting as a Education administrator for [employer]  ***

## 2022-05-22 ENCOUNTER — Inpatient Hospital Stay: Payer: 59

## 2022-05-22 ENCOUNTER — Inpatient Hospital Stay (HOSPITAL_BASED_OUTPATIENT_CLINIC_OR_DEPARTMENT_OTHER): Payer: 59 | Admitting: Hematology and Oncology

## 2022-05-22 ENCOUNTER — Other Ambulatory Visit: Payer: Self-pay

## 2022-05-22 ENCOUNTER — Inpatient Hospital Stay: Payer: 59 | Attending: Hematology and Oncology

## 2022-05-22 VITALS — BP 113/71 | HR 80 | Resp 18

## 2022-05-22 VITALS — BP 132/73 | HR 89 | Temp 97.9°F | Resp 17 | Ht 66.0 in | Wt 152.6 lb

## 2022-05-22 DIAGNOSIS — C50412 Malignant neoplasm of upper-outer quadrant of left female breast: Secondary | ICD-10-CM | POA: Diagnosis present

## 2022-05-22 DIAGNOSIS — Z170 Estrogen receptor positive status [ER+]: Secondary | ICD-10-CM

## 2022-05-22 DIAGNOSIS — Z79899 Other long term (current) drug therapy: Secondary | ICD-10-CM | POA: Diagnosis not present

## 2022-05-22 DIAGNOSIS — Z95828 Presence of other vascular implants and grafts: Secondary | ICD-10-CM

## 2022-05-22 DIAGNOSIS — Z5112 Encounter for antineoplastic immunotherapy: Secondary | ICD-10-CM | POA: Diagnosis present

## 2022-05-22 LAB — CBC WITH DIFFERENTIAL (CANCER CENTER ONLY)
Abs Immature Granulocytes: 0.01 10*3/uL (ref 0.00–0.07)
Basophils Absolute: 0 10*3/uL (ref 0.0–0.1)
Basophils Relative: 1 %
Eosinophils Absolute: 0.1 10*3/uL (ref 0.0–0.5)
Eosinophils Relative: 2 %
HCT: 37.5 % (ref 36.0–46.0)
Hemoglobin: 12.8 g/dL (ref 12.0–15.0)
Immature Granulocytes: 0 %
Lymphocytes Relative: 35 %
Lymphs Abs: 1.4 10*3/uL (ref 0.7–4.0)
MCH: 29.8 pg (ref 26.0–34.0)
MCHC: 34.1 g/dL (ref 30.0–36.0)
MCV: 87.4 fL (ref 80.0–100.0)
Monocytes Absolute: 0.5 10*3/uL (ref 0.1–1.0)
Monocytes Relative: 14 %
Neutro Abs: 1.9 10*3/uL (ref 1.7–7.7)
Neutrophils Relative %: 48 %
Platelet Count: 121 10*3/uL — ABNORMAL LOW (ref 150–400)
RBC: 4.29 MIL/uL (ref 3.87–5.11)
RDW: 17.3 % — ABNORMAL HIGH (ref 11.5–15.5)
WBC Count: 3.9 10*3/uL — ABNORMAL LOW (ref 4.0–10.5)
nRBC: 0 % (ref 0.0–0.2)

## 2022-05-22 LAB — CMP (CANCER CENTER ONLY)
ALT: 16 U/L (ref 0–44)
AST: 29 U/L (ref 15–41)
Albumin: 3.6 g/dL (ref 3.5–5.0)
Alkaline Phosphatase: 114 U/L (ref 38–126)
Anion gap: 4 — ABNORMAL LOW (ref 5–15)
BUN: 14 mg/dL (ref 8–23)
CO2: 31 mmol/L (ref 22–32)
Calcium: 9.1 mg/dL (ref 8.9–10.3)
Chloride: 105 mmol/L (ref 98–111)
Creatinine: 0.56 mg/dL (ref 0.44–1.00)
GFR, Estimated: >60 mL/min (ref >60)
Glucose, Bld: 91 mg/dL (ref 70–99)
Potassium: 3.7 mmol/L (ref 3.5–5.1)
Sodium: 140 mmol/L (ref 135–145)
Total Bilirubin: 0.6 mg/dL (ref 0.3–1.2)
Total Protein: 7 g/dL (ref 6.5–8.1)

## 2022-05-22 MED ORDER — ACETAMINOPHEN 325 MG PO TABS
650.0000 mg | ORAL_TABLET | Freq: Once | ORAL | Status: AC
  Administered 2022-05-22: 650 mg via ORAL
  Filled 2022-05-22: qty 2

## 2022-05-22 MED ORDER — SODIUM CHLORIDE 0.9 % IV SOLN
3.6000 mg/kg | Freq: Once | INTRAVENOUS | Status: AC
  Administered 2022-05-22: 260 mg via INTRAVENOUS
  Filled 2022-05-22: qty 8

## 2022-05-22 MED ORDER — SODIUM CHLORIDE 0.9% FLUSH
10.0000 mL | Freq: Once | INTRAVENOUS | Status: AC
  Administered 2022-05-22: 10 mL

## 2022-05-22 MED ORDER — HEPARIN SOD (PORK) LOCK FLUSH 100 UNIT/ML IV SOLN
500.0000 [IU] | Freq: Once | INTRAVENOUS | Status: AC | PRN
  Administered 2022-05-22: 500 [IU]

## 2022-05-22 MED ORDER — DIPHENHYDRAMINE HCL 25 MG PO CAPS
25.0000 mg | ORAL_CAPSULE | Freq: Once | ORAL | Status: AC
  Administered 2022-05-22: 25 mg via ORAL
  Filled 2022-05-22: qty 1

## 2022-05-22 MED ORDER — PROCHLORPERAZINE MALEATE 10 MG PO TABS
10.0000 mg | ORAL_TABLET | Freq: Once | ORAL | Status: AC
  Administered 2022-05-22: 10 mg via ORAL
  Filled 2022-05-22: qty 1

## 2022-05-22 MED ORDER — SODIUM CHLORIDE 0.9 % IV SOLN: Freq: Once | INTRAVENOUS | Status: AC

## 2022-05-22 MED ORDER — SODIUM CHLORIDE 0.9% FLUSH
10.0000 mL | INTRAVENOUS | Status: DC | PRN
  Administered 2022-05-22: 10 mL

## 2022-05-22 NOTE — Patient Instructions (Signed)
[facility]  Discharge Instructions: Thank you for choosing [facility] to provide your oncology and hematology care.   If you have a lab appointment with the Cancer Center, please go directly to the Cancer Center and check in at the registration area.   Wear comfortable clothing and clothing appropriate for easy access to any Portacath or PICC line.   We strive to give you quality time with your provider. You may need to reschedule your appointment if you arrive late (15 or more minutes).  Arriving late affects you and other patients whose appointments are after yours.  Also, if you miss three or more appointments without notifying the office, you may be dismissed from the clinic at the provider's discretion.      For prescription refill requests, have your pharmacy contact our office and allow 72 hours for refills to be completed.    Today you received the following chemotherapy and/or immunotherapy agents Kadcyla      To help prevent nausea and vomiting after your treatment, we encourage you to take your nausea medication as directed.  BELOW ARE SYMPTOMS THAT SHOULD BE REPORTED IMMEDIATELY: *FEVER GREATER THAN 100.4 F (38 C) OR HIGHER *CHILLS OR SWEATING *NAUSEA AND VOMITING THAT IS NOT CONTROLLED WITH YOUR NAUSEA MEDICATION *UNUSUAL SHORTNESS OF BREATH *UNUSUAL BRUISING OR BLEEDING *URINARY PROBLEMS (pain or burning when urinating, or frequent urination) *BOWEL PROBLEMS (unusual diarrhea, constipation, pain near the anus) TENDERNESS IN MOUTH AND THROAT WITH OR WITHOUT PRESENCE OF ULCERS (sore throat, sores in mouth, or a toothache) UNUSUAL RASH, SWELLING OR PAIN  UNUSUAL VAGINAL DISCHARGE OR ITCHING   Items with * indicate a potential emergency and should be followed up as soon as possible or go to the Emergency Department if any problems should occur.  Please show the CHEMOTHERAPY ALERT CARD or IMMUNOTHERAPY ALERT CARD at check-in  to the Emergency Department and triage nurse.  Should you have questions after your visit or need to cancel or reschedule your appointment, please contact [facility]  Dept: [phone]  and follow the prompts.  Office hours are 8:00 a.m. to 4:30 p.m. Monday - Friday. Please note that voicemails left after 4:00 p.m. may not be returned until the following business day.  We are closed weekends and major holidays. You have access to a nurse at all times for urgent questions. Please call the main number to the clinic Dept: [phone] and follow the prompts.   For any non-urgent questions, you may also contact your provider using MyChart. We now offer e-Visits for anyone 18 and older to request care online for non-urgent symptoms. For details visit [email or web address].   Also download the MyChart app! Go to the app store, search "MyChart", open the app, select [place], and log in with your MyChart username and password.  

## 2022-05-22 NOTE — Assessment & Plan Note (Addendum)
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 Neoadjuvant chemotherapy with TCHP x6 completed 12/05/2021 followed by Kadcyla maintenance to start 01/23/2022. 2. 01/01/2022: Left lumpectomy: Grade 2 IDC 1.5 cm with intermediate grade DCIS, margins negative, lymphovascular invasion identified, 2/6 lymph nodes positive ER 90%, PR 90%, Ki-67 5%, HER2 1+ 3. Followed by adjuvant radiation therapy 4.  Followed by adjuvant antiestrogen therapy -------------------------------------------------------------------------------------------------------------------------------------- Current Treatment: Kadcyla (declined COMPASS HER 2 trial) started 01/22/2022 today cycle 6 Labs reviewed: Normal   Kadcyla toxicities: Intermittent loose stools Slight fatigue   RTC in 3 weeks for Kadcyla maintenance

## 2022-05-26 ENCOUNTER — Ambulatory Visit
Admission: RE | Admit: 2022-05-26 | Discharge: 2022-05-26 | Disposition: A | Discharge status: Home / Self Care | Payer: 59 | Source: Ambulatory Visit | Attending: Hematology and Oncology | Admitting: Hematology and Oncology

## 2022-05-26 NOTE — Progress Notes (Signed)
  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   Symptoms of fatigue have improved since completing therapy.  Symptoms of skin changes have  improved since completing therapy.  The patient was encouraged to avoid sun exposure in the area of prior treatment for up to one year following radiation with either sunscreen or by the style of clothing worn in the sun.  The patient has scheduled follow up with her medical oncologist Dr. Lindi Adie for ongoing surveillance, and was encouraged to call if she develops concerns or questions regarding radiation.   This concludes the interview.   Leandra Kern, LPN

## 2022-05-28 ENCOUNTER — Other Ambulatory Visit: Payer: Self-pay

## 2022-05-31 ENCOUNTER — Other Ambulatory Visit: Payer: Self-pay

## 2022-06-05 ENCOUNTER — Ambulatory Visit: Payer: 59 | Admitting: Adult Health

## 2022-06-05 ENCOUNTER — Ambulatory Visit: Payer: 59

## 2022-06-05 ENCOUNTER — Other Ambulatory Visit: Payer: 59

## 2022-06-05 ENCOUNTER — Ambulatory Visit: Payer: 59 | Admitting: Hematology and Oncology

## 2022-06-11 ENCOUNTER — Other Ambulatory Visit: Payer: Self-pay

## 2022-06-11 ENCOUNTER — Encounter: Payer: Self-pay | Admitting: Adult Health

## 2022-06-11 ENCOUNTER — Inpatient Hospital Stay (HOSPITAL_BASED_OUTPATIENT_CLINIC_OR_DEPARTMENT_OTHER): Payer: 59 | Admitting: Adult Health

## 2022-06-11 ENCOUNTER — Inpatient Hospital Stay: Payer: 59

## 2022-06-11 ENCOUNTER — Telehealth: Payer: Self-pay | Admitting: Hematology and Oncology

## 2022-06-11 ENCOUNTER — Other Ambulatory Visit: Payer: Self-pay | Admitting: Hematology and Oncology

## 2022-06-11 ENCOUNTER — Inpatient Hospital Stay: Payer: 59 | Attending: Hematology and Oncology

## 2022-06-11 VITALS — BP 133/71 | HR 71 | Temp 98.2°F | Resp 18

## 2022-06-11 DIAGNOSIS — C50412 Malignant neoplasm of upper-outer quadrant of left female breast: Secondary | ICD-10-CM

## 2022-06-11 DIAGNOSIS — Z95828 Presence of other vascular implants and grafts: Secondary | ICD-10-CM

## 2022-06-11 DIAGNOSIS — Z170 Estrogen receptor positive status [ER+]: Secondary | ICD-10-CM

## 2022-06-11 DIAGNOSIS — I427 Cardiomyopathy due to drug and external agent: Secondary | ICD-10-CM

## 2022-06-11 DIAGNOSIS — Z5112 Encounter for antineoplastic immunotherapy: Secondary | ICD-10-CM | POA: Diagnosis present

## 2022-06-11 DIAGNOSIS — Z5181 Encounter for therapeutic drug level monitoring: Secondary | ICD-10-CM

## 2022-06-11 LAB — CBC WITH DIFFERENTIAL (CANCER CENTER ONLY)
Abs Immature Granulocytes: 0.01 10*3/uL (ref 0.00–0.07)
Basophils Absolute: 0 10*3/uL (ref 0.0–0.1)
Basophils Relative: 1 %
Eosinophils Absolute: 0.1 10*3/uL (ref 0.0–0.5)
Eosinophils Relative: 2 %
HCT: 37 % (ref 36.0–46.0)
Hemoglobin: 12.6 g/dL (ref 12.0–15.0)
Immature Granulocytes: 0 %
Lymphocytes Relative: 28 %
Lymphs Abs: 1.1 10*3/uL (ref 0.7–4.0)
MCH: 29.7 pg (ref 26.0–34.0)
MCHC: 34.1 g/dL (ref 30.0–36.0)
MCV: 87.3 fL (ref 80.0–100.0)
Monocytes Absolute: 0.5 10*3/uL (ref 0.1–1.0)
Monocytes Relative: 13 %
Neutro Abs: 2.2 10*3/uL (ref 1.7–7.7)
Neutrophils Relative %: 56 %
Platelet Count: 113 10*3/uL — ABNORMAL LOW (ref 150–400)
RBC: 4.24 MIL/uL (ref 3.87–5.11)
RDW: 17.6 % — ABNORMAL HIGH (ref 11.5–15.5)
WBC Count: 3.9 10*3/uL — ABNORMAL LOW (ref 4.0–10.5)
nRBC: 0 % (ref 0.0–0.2)

## 2022-06-11 LAB — CMP (CANCER CENTER ONLY)
ALT: 21 U/L (ref 0–44)
AST: 37 U/L (ref 15–41)
Albumin: 3.5 g/dL (ref 3.5–5.0)
Alkaline Phosphatase: 125 U/L (ref 38–126)
Anion gap: 5 (ref 5–15)
BUN: 14 mg/dL (ref 8–23)
CO2: 30 mmol/L (ref 22–32)
Calcium: 9.1 mg/dL (ref 8.9–10.3)
Chloride: 104 mmol/L (ref 98–111)
Creatinine: 0.62 mg/dL (ref 0.44–1.00)
GFR, Estimated: >60 mL/min (ref >60)
Glucose, Bld: 136 mg/dL — ABNORMAL HIGH (ref 70–99)
Potassium: 3.6 mmol/L (ref 3.5–5.1)
Sodium: 139 mmol/L (ref 135–145)
Total Bilirubin: 0.6 mg/dL (ref 0.3–1.2)
Total Protein: 6.9 g/dL (ref 6.5–8.1)

## 2022-06-11 MED ORDER — HEPARIN SOD (PORK) LOCK FLUSH 100 UNIT/ML IV SOLN
500.0000 [IU] | Freq: Once | INTRAVENOUS | Status: AC | PRN
  Administered 2022-06-11: 500 [IU]

## 2022-06-11 MED ORDER — DIPHENHYDRAMINE HCL 25 MG PO CAPS
25.0000 mg | ORAL_CAPSULE | Freq: Once | ORAL | Status: AC
  Administered 2022-06-11: 25 mg via ORAL
  Filled 2022-06-11: qty 1

## 2022-06-11 MED ORDER — SODIUM CHLORIDE 0.9% FLUSH
10.0000 mL | Freq: Once | INTRAVENOUS | Status: AC
  Administered 2022-06-11: 10 mL

## 2022-06-11 MED ORDER — ACETAMINOPHEN 325 MG PO TABS
650.0000 mg | ORAL_TABLET | Freq: Once | ORAL | Status: AC
  Administered 2022-06-11: 650 mg via ORAL
  Filled 2022-06-11: qty 2

## 2022-06-11 MED ORDER — PROCHLORPERAZINE MALEATE 10 MG PO TABS
10.0000 mg | ORAL_TABLET | Freq: Once | ORAL | Status: AC
  Administered 2022-06-11: 10 mg via ORAL
  Filled 2022-06-11: qty 1

## 2022-06-11 MED ORDER — SODIUM CHLORIDE 0.9 % IV SOLN
3.6000 mg/kg | Freq: Once | INTRAVENOUS | Status: AC
  Administered 2022-06-11: 260 mg via INTRAVENOUS
  Filled 2022-06-11: qty 8

## 2022-06-11 MED ORDER — SODIUM CHLORIDE 0.9% FLUSH
10.0000 mL | INTRAVENOUS | Status: DC | PRN
  Administered 2022-06-11: 10 mL

## 2022-06-11 MED ORDER — SODIUM CHLORIDE 0.9 % IV SOLN: Freq: Once | INTRAVENOUS | Status: AC

## 2022-06-11 NOTE — Patient Instructions (Signed)
[facility]  Discharge Instructions: Thank you for choosing [facility] to provide your oncology and hematology care.   If you have a lab appointment with the Cancer Center, please go directly to the Cancer Center and check in at the registration area.   Wear comfortable clothing and clothing appropriate for easy access to any Portacath or PICC line.   We strive to give you quality time with your provider. You may need to reschedule your appointment if you arrive late (15 or more minutes).  Arriving late affects you and other patients whose appointments are after yours.  Also, if you miss three or more appointments without notifying the office, you may be dismissed from the clinic at the provider's discretion.      For prescription refill requests, have your pharmacy contact our office and allow 72 hours for refills to be completed.    Today you received the following chemotherapy and/or immunotherapy agents: Kadcyla.       To help prevent nausea and vomiting after your treatment, we encourage you to take your nausea medication as directed.  BELOW ARE SYMPTOMS THAT SHOULD BE REPORTED IMMEDIATELY: *FEVER GREATER THAN 100.4 F (38 C) OR HIGHER *CHILLS OR SWEATING *NAUSEA AND VOMITING THAT IS NOT CONTROLLED WITH YOUR NAUSEA MEDICATION *UNUSUAL SHORTNESS OF BREATH *UNUSUAL BRUISING OR BLEEDING *URINARY PROBLEMS (pain or burning when urinating, or frequent urination) *BOWEL PROBLEMS (unusual diarrhea, constipation, pain near the anus) TENDERNESS IN MOUTH AND THROAT WITH OR WITHOUT PRESENCE OF ULCERS (sore throat, sores in mouth, or a toothache) UNUSUAL RASH, SWELLING OR PAIN  UNUSUAL VAGINAL DISCHARGE OR ITCHING   Items with * indicate a potential emergency and should be followed up as soon as possible or go to the Emergency Department if any problems should occur.  Please show the CHEMOTHERAPY ALERT CARD or IMMUNOTHERAPY ALERT CARD at  check-in to the Emergency Department and triage nurse.  Should you have questions after your visit or need to cancel or reschedule your appointment, please contact [facility]  Dept: [phone]  and follow the prompts.  Office hours are 8:00 a.m. to 4:30 p.m. Monday - Friday. Please note that voicemails left after 4:00 p.m. may not be returned until the following business day.  We are closed weekends and major holidays. You have access to a nurse at all times for urgent questions. Please call the main number to the clinic Dept: [phone] and follow the prompts.   For any non-urgent questions, you may also contact your provider using MyChart. We now offer e-Visits for anyone 18 and older to request care online for non-urgent symptoms. For details visit [email or web address].   Also download the MyChart app! Go to the app store, search "MyChart", open the app, select [facility], and log in with your MyChart username and password.   

## 2022-06-11 NOTE — Telephone Encounter (Signed)
Scheduled appointments per WQ. Patient is aware of the made appointments.  

## 2022-06-11 NOTE — Progress Notes (Signed)
[facility] Follow up:    Amber Devoid, PA-C [id number] [place] [address]   DIAGNOSIS:  Cancer Staging  Malignant neoplasm of upper-outer quadrant of left breast, estrogen receptor positive ([place]) Staging form: Breast, AJCC 8th Edition - Clinical stage from 08/08/2021: Stage IB (cT1c, cN1(f), cM0, G2, ER+, PR+, HER2+) - Signed by Nicholas Lose, MD on 08/08/2021 Stage prefix: Initial diagnosis Method of lymph node assessment: Core biopsy Histologic grading system: 3 grade system - Clinical: No stage assigned - Unsigned   SUMMARY OF ONCOLOGIC HISTORY: Oncology History  Malignant neoplasm of upper-outer quadrant of left breast, estrogen receptor positive ([place])  07/01/2021 Initial Diagnosis   Screening mammogram 06/27/2021 detected abnormality in the left breast which led to diagnostic mammogram and left breast ultrasound that revealed hypoechoic mass with irregular borders 2:30 position 2 cm left axilla: 1 abnormal lymph node, grade 2 IDC with DCIS ER 99%, PR 95%, HER2 2+ by IHC, FISH positive, Ki-67 20%, left axilla lymph node: Positive   08/08/2021 Cancer Staging   Staging form: Breast, AJCC 8th Edition - Clinical stage from 08/08/2021: Stage IB (cT1c, cN1(f), cM0, G2, ER+, PR+, HER2+) - Signed by Nicholas Lose, MD on 08/08/2021 Stage prefix: Initial diagnosis Method of lymph node assessment: Core biopsy Histologic grading system: 3 grade system   08/14/2021 - 12/26/2021 Chemotherapy   Patient is on Treatment Plan : BREAST  Docetaxel + Carboplatin + Trastuzumab + Pertuzumab  (TCHP) q21d / Trastuzumab + Pertuzumab q21d     08/22/2021 - 11/14/2021 Chemotherapy   Patient is on Treatment Plan : BREAST  Docetaxel + Carboplatin + Trastuzumab + Pertuzumab  (TCHP) q21d      01/01/2022 Surgery   Left lumpectomy: Grade 2 IDC 1.5 cm with intermediate grade DCIS, margins negative, lymphovascular invasion identified, 2/6 lymph nodes positive ER 90%, PR  90%, Ki-67 5%, HER2 1+    01/22/2022 -  Chemotherapy   Every 3 weeks x 11 cycles Patient is on Treatment Plan : BREAST ADO-Trastuzumab Emtansine (Kadcyla) q21d      02/13/2022 - 04/10/2022 Radiation Therapy   Site Technique Total Dose (Gy) Dose per Fx (Gy) Completed Fx Beam Energies  Breast, Left: Breast_L 3D 50.4/50.4 1.8 28/28 6X, 10X  Breast, Left: Breast_L_SCV_PAB 3D 50.4/50.4 1.8 28/28 6X, 10X  Breast, Left: Breast_L_Bst 3D 10/10 2 5/5 6X, 10X       CURRENT THERAPY: Kadcyla  INTERVAL HISTORY: Amber Mccormick 67 y.o. female returns for evaluation prior to receiving treatment with Kadcyla.  Most recent echocardiogram occurred on March 03, 2022 and demonstrated a left ventricular ejection fraction of 60 to 65%.  Amber Mccormick is feeling quite well today.  She denies any significant side effects from receiving the Kadcyla.   Patient Active Problem List   Diagnosis Date Noted   Port-A-Cath in place 08/22/2021   Malignant neoplasm of upper-outer quadrant of left breast, estrogen receptor positive ([place]) 08/08/2021   Mixed hyperlipidemia 05/21/2020   Anxiety 05/21/2020   Acquired hypothyroidism 05/21/2020    has No Known Allergies.  MEDICAL HISTORY: Past Medical History:  Diagnosis Date   Anxiety    Cancer ([place]) 05/2021   right breast IDC, + lymph node   Hyperlipidemia    Hypertension    no meds now, BP has been good   Hypothyroidism     SURGICAL HISTORY: Past Surgical History:  Procedure Laterality Date   BREAST LUMPECTOMY WITH RADIOACTIVE SEED AND SENTINEL LYMPH NODE BIOPSY Left 01/01/2022  Procedure: LEFT BREAST LUMPECTOMY WITH RADIOACTIVE SEED AND SENTINEL LYMPH NODE BIOPSY;  Surgeon: Erroll Luna, MD;  Location: [facility];  Service: General;  Laterality: Left;   MOHS SURGERY     on nose   PORTACATH PLACEMENT Right 08/21/2021   Procedure: INSERTION PORT-A-CATH;  Surgeon: Erroll Luna, MD;  Location: [facility];  Service: General;   Laterality: Right;   RADIOACTIVE SEED GUIDED AXILLARY SENTINEL LYMPH NODE Left 01/01/2022   Procedure: RADIOACTIVE SEED GUIDED LEFT AXILLARY SENTINEL LYMPH NODE BIOPSY;  Surgeon: Erroll Luna, MD;  Location: [facility];  Service: General;  Laterality: Left;   TUBAL LIGATION      SOCIAL HISTORY: Social History   Socioeconomic History   Marital status: Married    Spouse name: Not on file   Number of children: Not on file   Years of education: Not on file   Highest education level: Not on file  Occupational History   Not on file  Tobacco Use   Smoking status: Never   Smokeless tobacco: Never  Substance and Sexual Activity   Alcohol use: Never   Drug use: Never   Sexual activity: Yes    Birth control/protection: Post-menopausal  Other Topics Concern   Not on file  Social History Narrative   Not on file   Social Determinants of Health   Financial Resource Strain: Not on file  Food Insecurity: Not on file  Transportation Needs: Not on file  Physical Activity: Not on file  Stress: Not on file  Social Connections: Not on file  Intimate Partner Violence: Not on file    FAMILY HISTORY: Family History  Problem Relation Age of Onset   Pancreatic cancer Father    Bone cancer Maternal Uncle     Review of Systems  Constitutional:  Negative for appetite change, chills, fatigue, fever and unexpected weight change.  HENT:   Negative for hearing loss, lump/mass and trouble swallowing.   Eyes:  Negative for eye problems and icterus.  Respiratory:  Negative for chest tightness, cough and shortness of breath.   Cardiovascular:  Negative for chest pain, leg swelling and palpitations.  Gastrointestinal:  Negative for abdominal distention, abdominal pain, constipation, diarrhea, nausea and vomiting.  Endocrine: Negative for hot flashes.  Genitourinary:  Negative for difficulty urinating.   Musculoskeletal:  Negative for arthralgias.  Skin:  Negative for itching and  rash.  Neurological:  Negative for dizziness, extremity weakness, headaches and numbness.  Hematological:  Negative for adenopathy. Does not bruise/bleed easily.  Psychiatric/Behavioral:  Negative for depression. The patient is not nervous/anxious.       PHYSICAL EXAMINATION  ECOG PERFORMANCE STATUS: 0 - Asymptomatic  Vitals:   06/11/22 1039  BP: (!) 142/69  Pulse: 82  Resp: 18  Temp: 98.6 F (37 C)  SpO2: 98%    Physical Exam Constitutional:      General: She is not in acute distress.    Appearance: Normal appearance. She is not toxic-appearing.  HENT:     Head: Normocephalic and atraumatic.  Eyes:     General: No scleral icterus. Cardiovascular:     Rate and Rhythm: Normal rate and regular rhythm.     Pulses: Normal pulses.     Heart sounds: Normal heart sounds.  Pulmonary:     Effort: Pulmonary effort is normal.     Breath sounds: Normal breath sounds.  Abdominal:     General: Abdomen is flat. Bowel sounds are normal. There is no distension.  Palpations: Abdomen is soft.     Tenderness: There is no abdominal tenderness.  Musculoskeletal:        General: No swelling.     Cervical back: Neck supple.  Lymphadenopathy:     Cervical: No cervical adenopathy.  Skin:    General: Skin is warm and dry.     Findings: No rash.  Neurological:     General: No focal deficit present.     Mental Status: She is alert.  Psychiatric:        Mood and Affect: Mood normal.        Behavior: Behavior normal.     LABORATORY DATA:  CBC    Component Value Date/Time   WBC 3.9 (L) 06/11/2022 1018   RBC 4.24 06/11/2022 1018   HGB 12.6 06/11/2022 1018   HCT 37.0 06/11/2022 1018   PLT 113 (L) 06/11/2022 1018   MCV 87.3 06/11/2022 1018   MCH 29.7 06/11/2022 1018   MCHC 34.1 06/11/2022 1018   RDW 17.6 (H) 06/11/2022 1018   LYMPHSABS 1.1 06/11/2022 1018   MONOABS 0.5 06/11/2022 1018   EOSABS 0.1 06/11/2022 1018   BASOSABS 0.0 06/11/2022 1018    CMP     Component Value  Date/Time   NA 139 06/11/2022 1018   K 3.6 06/11/2022 1018   CL 104 06/11/2022 1018   CO2 30 06/11/2022 1018   GLUCOSE 136 (H) 06/11/2022 1018   BUN 14 06/11/2022 1018   CREATININE 0.62 06/11/2022 1018   CALCIUM 9.1 06/11/2022 1018   PROT 6.9 06/11/2022 1018   ALBUMIN 3.5 06/11/2022 1018   AST 37 06/11/2022 1018   ALT 21 06/11/2022 1018   ALKPHOS 125 06/11/2022 1018   BILITOT 0.6 06/11/2022 1018   GFRNONAA >60 06/11/2022 1018      ASSESSMENT and THERAPY PLAN:   Malignant neoplasm of upper-outer quadrant of left breast, estrogen receptor positive ([place]) Amber Mccormick is here today for follow-up and evaluation of her 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  She is status post neoadjuvant chemotherapy, lumpectomy, adjuvant radiation, and continues on Kadcyla adjuvantly.  Echocardiograms: Most recent was normal in December.  Repeat due this month.  Asymptomatic, will proceed with Kadcyla today.  Thrombocytopenia: This is mild and she is not at increased risk for any life-threatening bleeding.  I reviewed this with her.  Next steps: Has follow-up with Dr. Lindi Adie in 3 weeks for labs, follow-up, and infusion.  To discuss antiestrogen therapy at that time.    All questions were answered. The patient knows to call the clinic with any problems, questions or concerns. We can certainly see the patient much sooner if necessary.  Total encounter time:20 minutes*in face-to-face visit time, chart review, lab review, care coordination, order entry, and documentation of the encounter time.    Wilber Bihari, NP 06/11/22 12:12 PM Medical Oncology and Hematology [facility] [place], [place] [address] Tel. [phone]    Fax. [phone]  *Total Encounter Time as defined by the Centers for Medicare and Medicaid Services includes, in addition to the face-to-face time of a patient visit (documented in the note above) non-face-to-face time:  obtaining and reviewing outside history, ordering and reviewing medications, tests or procedures, care coordination (communications with other health care professionals or caregivers) and documentation in the medical record.

## 2022-06-11 NOTE — Assessment & Plan Note (Addendum)
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  She is status post neoadjuvant chemotherapy, lumpectomy, adjuvant radiation, and continues on Kadcyla adjuvantly.  Echocardiograms: Most recent was normal in December.  Repeat due this month.  Asymptomatic, will proceed with Kadcyla today.  Thrombocytopenia: This is mild and she is not at increased risk for any life-threatening bleeding.  I reviewed this with her.  Next steps: Has follow-up with Dr. Lindi Adie in 3 weeks for labs, follow-up, and infusion.  To discuss antiestrogen therapy at that time.

## 2022-06-11 NOTE — Progress Notes (Signed)
Pt declined 30 min post Kadcyla wait. VSS at discharge

## 2022-06-25 ENCOUNTER — Other Ambulatory Visit: Payer: 59

## 2022-06-25 ENCOUNTER — Ambulatory Visit: Payer: 59

## 2022-06-25 ENCOUNTER — Ambulatory Visit: Payer: 59 | Admitting: Adult Health

## 2022-06-25 NOTE — Progress Notes (Signed)
Patient Care Team: Kathreen Devoid, PA-C as PCP - Philomena Doheny, Paulette Blanch, RN as Oncology Nurse Navigator Rockwell Germany, RN as Oncology Nurse Navigator Nicholas Lose, MD as Consulting Physician (Hematology and Oncology) Minus Breeding, MD as Consulting Physician (Cardiology) Paralee Cancel, MD as Consulting Physician (Orthopedic Surgery)  DIAGNOSIS: No diagnosis found.  SUMMARY OF ONCOLOGIC HISTORY: Oncology History  Malignant neoplasm of upper-outer quadrant of left breast, estrogen receptor positive ([place])  07/01/2021 Initial Diagnosis   Screening mammogram 06/27/2021 detected abnormality in the left breast which led to diagnostic mammogram and left breast ultrasound that revealed hypoechoic mass with irregular borders 2:30 position 2 cm left axilla: 1 abnormal lymph node, grade 2 IDC with DCIS ER 99%, PR 95%, HER2 2+ by IHC, FISH positive, Ki-67 20%, left axilla lymph node: Positive   08/08/2021 Cancer Staging   Staging form: Breast, AJCC 8th Edition - Clinical stage from 08/08/2021: Stage IB (cT1c, cN1(f), cM0, G2, ER+, PR+, HER2+) - Signed by Nicholas Lose, MD on 08/08/2021 Stage prefix: Initial diagnosis Method of lymph node assessment: Core biopsy Histologic grading system: 3 grade system   08/14/2021 - 12/26/2021 Chemotherapy   Patient is on Treatment Plan : BREAST  Docetaxel + Carboplatin + Trastuzumab + Pertuzumab  (TCHP) q21d / Trastuzumab + Pertuzumab q21d     08/22/2021 - 11/14/2021 Chemotherapy   Patient is on Treatment Plan : BREAST  Docetaxel + Carboplatin + Trastuzumab + Pertuzumab  (TCHP) q21d      01/01/2022 Surgery   Left lumpectomy: Grade 2 IDC 1.5 cm with intermediate grade DCIS, margins negative, lymphovascular invasion identified, 2/6 lymph nodes positive ER 90%, PR 90%, Ki-67 5%, HER2 1+    01/22/2022 -  Chemotherapy   Every 3 weeks x 11 cycles Patient is on Treatment Plan : BREAST ADO-Trastuzumab Emtansine (Kadcyla) q21d      02/13/2022 - 04/10/2022  Radiation Therapy   Site Technique Total Dose (Gy) Dose per Fx (Gy) Completed Fx Beam Energies  Breast, Left: Breast_L 3D 50.4/50.4 1.8 28/28 6X, 10X  Breast, Left: Breast_L_SCV_PAB 3D 50.4/50.4 1.8 28/28 6X, 10X  Breast, Left: Breast_L_Bst 3D 10/10 2 5/5 6X, 10X       CHIEF COMPLIANT: Follow-up left breast on Kadcyla maintenance   INTERVAL HISTORY: Amber Mccormick is a 67 y.o with the above-mentioned left breast cancer currently on kadcyla maintenance. She presents to the clinic for a follow-up.    ALLERGIES:  has No Known Allergies.  MEDICATIONS:  Current Outpatient Medications  Medication Sig Dispense Refill   amitriptyline (ELAVIL) 10 MG tablet Take 10 mg by mouth at bedtime.     amLODipine (NORVASC) 5 MG tablet Take 5 mg by mouth daily.     atorvastatin (LIPITOR) 10 MG tablet Take 10 mg by mouth daily.     cholecalciferol (VITAMIN D3) 25 MCG (1000 UNIT) tablet Take 1,000 Units by mouth daily.     diphenhydrAMINE (BENADRYL) 25 MG tablet Take 25 mg by mouth every 6 (six) hours as needed.     diphenoxylate-atropine (LOMOTIL) 2.5-0.025 MG tablet Take 1 tablet by mouth 4 (four) times daily as needed for diarrhea or loose stools. 30 tablet 3   ibuprofen (ADVIL) 800 MG tablet Take 1 tablet (800 mg total) by mouth every 8 (eight) hours as needed. 30 tablet 0   levothyroxine (SYNTHROID) 50 MCG tablet Take 50 mcg by mouth daily.     metroNIDAZOLE (METROCREAM) 0.75 % cream Apply topically 2 (two) times daily as needed.  vitamin B-12 (CYANOCOBALAMIN) 500 MCG tablet Take by mouth.     vitamin C (ASCORBIC ACID) 500 MG tablet Take 500 mg by mouth daily.     No current facility-administered medications for this visit.    PHYSICAL EXAMINATION: ECOG PERFORMANCE STATUS: {CHL ONC ECOG [phone]}  There were no vitals filed for this visit. There were no vitals filed for this visit.  BREAST:*** No palpable masses or nodules in either right or left breasts. No palpable axillary  supraclavicular or infraclavicular adenopathy no breast tenderness or nipple discharge. (exam performed in the presence of a chaperone)  LABORATORY DATA:  I have reviewed the data as listed    Latest Ref Rng & Units 06/11/2022   10:18 AM 05/22/2022    9:20 AM 04/24/2022   10:00 AM  CMP  Glucose 70 - 99 mg/dL 136  91  78   BUN 8 - 23 mg/dL 14  14  13    Creatinine 0.44 - 1.00 mg/dL 0.62  0.56  0.66   Sodium 135 - 145 mmol/L 139  140  139   Potassium 3.5 - 5.1 mmol/L 3.6  3.7  3.8   Chloride 98 - 111 mmol/L 104  105  103   CO2 22 - 32 mmol/L 30  31  32   Calcium 8.9 - 10.3 mg/dL 9.1  9.1  9.4   Total Protein 6.5 - 8.1 g/dL 6.9  7.0  7.0   Total Bilirubin 0.3 - 1.2 mg/dL 0.6  0.6  0.4   Alkaline Phos 38 - 126 U/L 125  114  116   AST 15 - 41 U/L 37  29  28   ALT 0 - 44 U/L 21  16  16      Lab Results  Component Value Date   WBC 3.9 (L) 06/11/2022   HGB 12.6 06/11/2022   HCT 37.0 06/11/2022   MCV 87.3 06/11/2022   PLT 113 (L) 06/11/2022   NEUTROABS 2.2 06/11/2022    ASSESSMENT & PLAN:  No problem-specific Assessment & Plan notes found for this encounter.    No orders of the defined types were placed in this encounter.  The patient has a good understanding of the overall plan. she agrees with it. she will call with any problems that may develop before the next visit here. Total time spent: 30 mins including face to face time and time spent for planning, charting and co-ordination of care   Suzzette Righter, [place] 06/25/22    I Gardiner Coins am acting as a Education administrator for [employer]  ***

## 2022-06-26 ENCOUNTER — Encounter: Payer: Self-pay | Admitting: *Deleted

## 2022-06-26 ENCOUNTER — Other Ambulatory Visit: Payer: 59

## 2022-06-26 ENCOUNTER — Ambulatory Visit: Payer: 59 | Admitting: Adult Health

## 2022-06-26 ENCOUNTER — Ambulatory Visit: Payer: 59

## 2022-06-30 ENCOUNTER — Ambulatory Visit (HOSPITAL_COMMUNITY)
Admission: RE | Admit: 2022-06-30 | Discharge: 2022-06-30 | Disposition: A | Discharge status: Home / Self Care | Payer: 59 | Source: Ambulatory Visit | Attending: Adult Health | Admitting: Adult Health

## 2022-06-30 DIAGNOSIS — Z5181 Encounter for therapeutic drug level monitoring: Secondary | ICD-10-CM | POA: Diagnosis not present

## 2022-06-30 DIAGNOSIS — E785 Hyperlipidemia, unspecified: Secondary | ICD-10-CM | POA: Diagnosis not present

## 2022-06-30 DIAGNOSIS — C50412 Malignant neoplasm of upper-outer quadrant of left female breast: Secondary | ICD-10-CM

## 2022-06-30 DIAGNOSIS — Z01818 Encounter for other preprocedural examination: Secondary | ICD-10-CM | POA: Insufficient documentation

## 2022-06-30 DIAGNOSIS — Z170 Estrogen receptor positive status [ER+]: Secondary | ICD-10-CM | POA: Diagnosis not present

## 2022-06-30 DIAGNOSIS — Z79899 Other long term (current) drug therapy: Secondary | ICD-10-CM | POA: Diagnosis not present

## 2022-06-30 DIAGNOSIS — I427 Cardiomyopathy due to drug and external agent: Secondary | ICD-10-CM

## 2022-06-30 LAB — ECHOCARDIOGRAM COMPLETE
Area-P 1/2: 3.72 cm2
S' Lateral: 2.5 cm

## 2022-06-30 NOTE — Progress Notes (Signed)
  Echocardiogram 2D Echocardiogram has been performed.  Bobbye Charleston 06/30/2022, 11:00 AM

## 2022-07-01 ENCOUNTER — Other Ambulatory Visit: Payer: Self-pay

## 2022-07-03 ENCOUNTER — Inpatient Hospital Stay (HOSPITAL_BASED_OUTPATIENT_CLINIC_OR_DEPARTMENT_OTHER): Payer: 59 | Admitting: Hematology and Oncology

## 2022-07-03 ENCOUNTER — Inpatient Hospital Stay: Payer: 59 | Attending: Hematology and Oncology

## 2022-07-03 ENCOUNTER — Inpatient Hospital Stay: Payer: 59

## 2022-07-03 ENCOUNTER — Other Ambulatory Visit: Payer: Self-pay

## 2022-07-03 VITALS — BP 133/77 | HR 90 | Temp 97.5°F | Resp 18 | Wt 154.4 lb

## 2022-07-03 VITALS — BP 115/67 | HR 79 | Resp 18

## 2022-07-03 DIAGNOSIS — Z5112 Encounter for antineoplastic immunotherapy: Secondary | ICD-10-CM | POA: Insufficient documentation

## 2022-07-03 DIAGNOSIS — C50412 Malignant neoplasm of upper-outer quadrant of left female breast: Secondary | ICD-10-CM | POA: Insufficient documentation

## 2022-07-03 DIAGNOSIS — Z170 Estrogen receptor positive status [ER+]: Secondary | ICD-10-CM

## 2022-07-03 DIAGNOSIS — Z79899 Other long term (current) drug therapy: Secondary | ICD-10-CM | POA: Diagnosis not present

## 2022-07-03 LAB — CMP (CANCER CENTER ONLY)
ALT: 19 U/L (ref 0–44)
AST: 37 U/L (ref 15–41)
Albumin: 3.6 g/dL (ref 3.5–5.0)
Alkaline Phosphatase: 107 U/L (ref 38–126)
Anion gap: 6 (ref 5–15)
BUN: 10 mg/dL (ref 8–23)
CO2: 29 mmol/L (ref 22–32)
Calcium: 9.7 mg/dL (ref 8.9–10.3)
Chloride: 105 mmol/L (ref 98–111)
Creatinine: 0.59 mg/dL (ref 0.44–1.00)
GFR, Estimated: >60 mL/min (ref >60)
Glucose, Bld: 107 mg/dL — ABNORMAL HIGH (ref 70–99)
Potassium: 3.6 mmol/L (ref 3.5–5.1)
Sodium: 140 mmol/L (ref 135–145)
Total Bilirubin: 0.8 mg/dL (ref 0.3–1.2)
Total Protein: 7 g/dL (ref 6.5–8.1)

## 2022-07-03 LAB — CBC WITH DIFFERENTIAL (CANCER CENTER ONLY)
Abs Immature Granulocytes: 0.01 10*3/uL (ref 0.00–0.07)
Basophils Absolute: 0 10*3/uL (ref 0.0–0.1)
Basophils Relative: 1 %
Eosinophils Absolute: 0.1 10*3/uL (ref 0.0–0.5)
Eosinophils Relative: 3 %
HCT: 37.7 % (ref 36.0–46.0)
Hemoglobin: 12.8 g/dL (ref 12.0–15.0)
Immature Granulocytes: 0 %
Lymphocytes Relative: 34 %
Lymphs Abs: 1.1 10*3/uL (ref 0.7–4.0)
MCH: 29.8 pg (ref 26.0–34.0)
MCHC: 34 g/dL (ref 30.0–36.0)
MCV: 87.7 fL (ref 80.0–100.0)
Monocytes Absolute: 0.5 10*3/uL (ref 0.1–1.0)
Monocytes Relative: 14 %
Neutro Abs: 1.7 10*3/uL (ref 1.7–7.7)
Neutrophils Relative %: 48 %
Platelet Count: 113 10*3/uL — ABNORMAL LOW (ref 150–400)
RBC: 4.3 MIL/uL (ref 3.87–5.11)
RDW: 17.9 % — ABNORMAL HIGH (ref 11.5–15.5)
WBC Count: 3.4 10*3/uL — ABNORMAL LOW (ref 4.0–10.5)
nRBC: 0 % (ref 0.0–0.2)

## 2022-07-03 MED ORDER — PROCHLORPERAZINE MALEATE 10 MG PO TABS
10.0000 mg | ORAL_TABLET | Freq: Once | ORAL | Status: AC
  Administered 2022-07-03: 10 mg via ORAL
  Filled 2022-07-03: qty 1

## 2022-07-03 MED ORDER — DIPHENHYDRAMINE HCL 25 MG PO CAPS
25.0000 mg | ORAL_CAPSULE | Freq: Once | ORAL | Status: AC
  Administered 2022-07-03: 25 mg via ORAL
  Filled 2022-07-03: qty 1

## 2022-07-03 MED ORDER — SODIUM CHLORIDE 0.9 % IV SOLN: Freq: Once | INTRAVENOUS | Status: AC

## 2022-07-03 MED ORDER — HEPARIN SOD (PORK) LOCK FLUSH 100 UNIT/ML IV SOLN
500.0000 [IU] | Freq: Once | INTRAVENOUS | Status: AC | PRN
  Administered 2022-07-03: 500 [IU]

## 2022-07-03 MED ORDER — SODIUM CHLORIDE 0.9 % IV SOLN
3.6000 mg/kg | Freq: Once | INTRAVENOUS | Status: AC
  Administered 2022-07-03: 260 mg via INTRAVENOUS
  Filled 2022-07-03: qty 8

## 2022-07-03 MED ORDER — ACETAMINOPHEN 325 MG PO TABS
650.0000 mg | ORAL_TABLET | Freq: Once | ORAL | Status: AC
  Administered 2022-07-03: 650 mg via ORAL
  Filled 2022-07-03: qty 2

## 2022-07-03 MED ORDER — SODIUM CHLORIDE 0.9% FLUSH
10.0000 mL | INTRAVENOUS | Status: DC | PRN
  Administered 2022-07-03: 10 mL

## 2022-07-03 NOTE — Patient Instructions (Signed)
[facility]  Discharge Instructions: Thank you for choosing [facility] to provide your oncology and hematology care.   If you have a lab appointment with the Cancer Center, please go directly to the Cancer Center and check in at the registration area.   Wear comfortable clothing and clothing appropriate for easy access to any Portacath or PICC line.   We strive to give you quality time with your provider. You may need to reschedule your appointment if you arrive late (15 or more minutes).  Arriving late affects you and other patients whose appointments are after yours.  Also, if you miss three or more appointments without notifying the office, you may be dismissed from the clinic at the provider's discretion.      For prescription refill requests, have your pharmacy contact our office and allow 72 hours for refills to be completed.    Today you received the following chemotherapy and/or immunotherapy agents: Kadcyla.       To help prevent nausea and vomiting after your treatment, we encourage you to take your nausea medication as directed.  BELOW ARE SYMPTOMS THAT SHOULD BE REPORTED IMMEDIATELY: *FEVER GREATER THAN 100.4 F (38 C) OR HIGHER *CHILLS OR SWEATING *NAUSEA AND VOMITING THAT IS NOT CONTROLLED WITH YOUR NAUSEA MEDICATION *UNUSUAL SHORTNESS OF BREATH *UNUSUAL BRUISING OR BLEEDING *URINARY PROBLEMS (pain or burning when urinating, or frequent urination) *BOWEL PROBLEMS (unusual diarrhea, constipation, pain near the anus) TENDERNESS IN MOUTH AND THROAT WITH OR WITHOUT PRESENCE OF ULCERS (sore throat, sores in mouth, or a toothache) UNUSUAL RASH, SWELLING OR PAIN  UNUSUAL VAGINAL DISCHARGE OR ITCHING   Items with * indicate a potential emergency and should be followed up as soon as possible or go to the Emergency Department if any problems should occur.  Please show the CHEMOTHERAPY ALERT CARD or IMMUNOTHERAPY ALERT CARD at  check-in to the Emergency Department and triage nurse.  Should you have questions after your visit or need to cancel or reschedule your appointment, please contact [facility]  Dept: [phone]  and follow the prompts.  Office hours are 8:00 a.m. to 4:30 p.m. Monday - Friday. Please note that voicemails left after 4:00 p.m. may not be returned until the following business day.  We are closed weekends and major holidays. You have access to a nurse at all times for urgent questions. Please call the main number to the clinic Dept: [phone] and follow the prompts.   For any non-urgent questions, you may also contact your provider using MyChart. We now offer e-Visits for anyone 18 and older to request care online for non-urgent symptoms. For details visit [email or web address].   Also download the MyChart app! Go to the app store, search "MyChart", open the app, select [facility], and log in with your MyChart username and password.   

## 2022-07-03 NOTE — Assessment & Plan Note (Signed)
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 Neoadjuvant chemotherapy with TCHP x6 completed 12/05/2021 followed by Kadcyla maintenance to start 01/23/2022. 2. 01/01/2022: Left lumpectomy: Grade 2 IDC 1.5 cm with intermediate grade DCIS, margins negative, lymphovascular invasion identified, 2/6 lymph nodes positive ER 90%, PR 90%, Ki-67 5%, HER2 1+ 3. Followed by adjuvant radiation therapy 4.  Followed by adjuvant antiestrogen therapy -------------------------------------------------------------------------------------------------------------------------------------- Current Treatment: Kadcyla (declined COMPASS HER 2 trial) started 01/22/2022 today cycle 8 Labs reviewed: Normal   Kadcyla toxicities: Intermittent loose stools Slight fatigue   She will complete Kadcyla 09/04/2022 RTC in 3 weeks for Kadcyla maintenance

## 2022-07-14 NOTE — Progress Notes (Signed)
Patient Care Team: Clemencia Course, PA-C as PCP - Denyse Amass, Margretta Ditty, RN as Oncology Nurse Navigator Donnelly Angelica, RN as Oncology Nurse Navigator Serena Croissant, MD as Consulting Physician (Hematology and Oncology) Rollene Rotunda, MD as Consulting Physician (Cardiology) Durene Romans, MD as Consulting Physician (Orthopedic Surgery)  DIAGNOSIS: No diagnosis found.  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%, PR 95%, HER2 2+ by IHC, FISH positive, Ki-67 20%, left axilla lymph node: Positive   08/08/2021 Cancer Staging   Staging form: Breast, AJCC 8th Edition - Clinical stage from 08/08/2021: Stage IB (cT1c, cN1(f), cM0, G2, ER+, PR+, HER2+) - Signed by Serena Croissant, MD on 08/08/2021 Stage prefix: Initial diagnosis Method of lymph node assessment: Core biopsy Histologic grading system: 3 grade system   08/14/2021 - 12/26/2021 Chemotherapy   Patient is on Treatment Plan : BREAST  Docetaxel + Carboplatin + Trastuzumab + Pertuzumab  (TCHP) q21d / Trastuzumab + Pertuzumab q21d     08/22/2021 - 11/14/2021 Chemotherapy   Patient is on Treatment Plan : BREAST  Docetaxel + Carboplatin + Trastuzumab + Pertuzumab  (TCHP) q21d      01/01/2022 Surgery   Left lumpectomy: Grade 2 IDC 1.5 cm with intermediate grade DCIS, margins negative, lymphovascular invasion identified, 2/6 lymph nodes positive ER 90%, PR 90%, Ki-67 5%, HER2 1+    01/22/2022 -  Chemotherapy   Every 3 weeks x 11 cycles Patient is on Treatment Plan : BREAST ADO-Trastuzumab Emtansine (Kadcyla) q21d      02/13/2022 - 04/10/2022  Radiation Therapy   Site Technique Total Dose (Gy) Dose per Fx (Gy) Completed Fx Beam Energies  Breast, Left: Breast_L 3D 50.4/50.4 1.8 28/28 6X, 10X  Breast, Left: Breast_L_SCV_PAB 3D 50.4/50.4 1.8 28/28 6X, 10X  Breast, Left: Breast_L_Bst 3D 10/10 2 5/5 6X, 10X       CHIEF COMPLIANT:   INTERVAL HISTORY: Dorace Scinta is a   ALLERGIES:  has No Known Allergies.  MEDICATIONS:  Current Outpatient Medications  Medication Sig Dispense Refill   amitriptyline (ELAVIL) 10 MG tablet Take 10 mg by mouth at bedtime.     amLODipine (NORVASC) 5 MG tablet Take 5 mg by mouth daily.     atorvastatin (LIPITOR) 10 MG tablet Take 10 mg by mouth daily.     cholecalciferol (VITAMIN D3) 25 MCG (1000 UNIT) tablet Take 1,000 Units by mouth daily.     diphenhydrAMINE (BENADRYL) 25 MG tablet Take 25 mg by mouth every 6 (six) hours as needed.     diphenoxylate-atropine (LOMOTIL) 2.5-0.025 MG tablet Take 1 tablet by mouth 4 (four) times daily as needed for diarrhea or loose stools. 30 tablet 3   ibuprofen (ADVIL) 800 MG tablet Take 1 tablet (800 mg total) by mouth every 8 (eight) hours as needed. 30 tablet 0   levothyroxine (SYNTHROID) 50 MCG tablet Take 50 mcg by mouth daily.     metroNIDAZOLE (METROCREAM) 0.75 % cream Apply topically 2 (two) times daily as needed.     vitamin B-12 (CYANOCOBALAMIN) 500 MCG tablet Take by mouth.     vitamin C (ASCORBIC ACID) 500 MG tablet Take 500 mg by mouth daily.  No current facility-administered medications for this visit.    PHYSICAL EXAMINATION: ECOG PERFORMANCE STATUS: {CHL ONC ECOG PS:[phone]}  There were no vitals filed for this visit. There were no vitals filed for this visit.  BREAST:*** No palpable masses or nodules in either right or left breasts. No palpable axillary supraclavicular or infraclavicular adenopathy no breast tenderness or nipple discharge. (exam performed in the presence of a chaperone)  LABORATORY DATA:  I have reviewed the data as  listed    Latest Ref Rng & Units 07/03/2022    9:57 AM 06/11/2022   10:18 AM 05/22/2022    9:20 AM  CMP  Glucose 70 - 99 mg/dL 191  660  91   BUN 8 - 23 mg/dL 10  14  14    Creatinine 0.44 - 1.00 mg/dL 6.00  4.59  9.77   Sodium 135 - 145 mmol/L 140  139  140   Potassium 3.5 - 5.1 mmol/L 3.6  3.6  3.7   Chloride 98 - 111 mmol/L 105  104  105   CO2 22 - 32 mmol/L 29  30  31    Calcium 8.9 - 10.3 mg/dL 9.7  9.1  9.1   Total Protein 6.5 - 8.1 g/dL 7.0  6.9  7.0   Total Bilirubin 0.3 - 1.2 mg/dL 0.8  0.6  0.6   Alkaline Phos 38 - 126 U/L 107  125  114   AST 15 - 41 U/L 37  37  29   ALT 0 - 44 U/L 19  21  16      Lab Results  Component Value Date   WBC 3.4 (L) 07/03/2022   HGB 12.8 07/03/2022   HCT 37.7 07/03/2022   MCV 87.7 07/03/2022   PLT 113 (L) 07/03/2022   NEUTROABS 1.7 07/03/2022    ASSESSMENT & PLAN:  No problem-specific Assessment & Plan notes found for this encounter.    No orders of the defined types were placed in this encounter.  The patient has a good understanding of the overall plan. she agrees with it. she will call with any problems that may develop before the next visit here. Total time spent: 30 mins including face to face time and time spent for planning, charting and co-ordination of care   Sherlyn Lick, CMA 07/14/22    I Janan Ridge am acting as a Neurosurgeon for [employer]  ***

## 2022-07-15 ENCOUNTER — Inpatient Hospital Stay (HOSPITAL_BASED_OUTPATIENT_CLINIC_OR_DEPARTMENT_OTHER): Payer: 59 | Admitting: Hematology and Oncology

## 2022-07-15 ENCOUNTER — Telehealth: Payer: Self-pay | Admitting: Hematology and Oncology

## 2022-07-15 ENCOUNTER — Other Ambulatory Visit: Payer: Self-pay

## 2022-07-15 VITALS — BP 152/76 | HR 94 | Temp 97.2°F | Resp 18 | Ht 66.0 in | Wt 153.3 lb

## 2022-07-15 DIAGNOSIS — Z780 Asymptomatic menopausal state: Secondary | ICD-10-CM | POA: Diagnosis not present

## 2022-07-15 DIAGNOSIS — Z170 Estrogen receptor positive status [ER+]: Secondary | ICD-10-CM

## 2022-07-15 DIAGNOSIS — C50412 Malignant neoplasm of upper-outer quadrant of left female breast: Secondary | ICD-10-CM | POA: Diagnosis not present

## 2022-07-15 DIAGNOSIS — Z5112 Encounter for antineoplastic immunotherapy: Secondary | ICD-10-CM | POA: Diagnosis not present

## 2022-07-15 MED ORDER — ANASTROZOLE 1 MG PO TABS: 1.0000 mg | ORAL_TABLET | Freq: Every day | ORAL | 3 refills | Status: DC

## 2022-07-15 NOTE — Telephone Encounter (Signed)
Cancelled and scheduled appointments per 4/16 los. Patient is aware of the changes made to her upcoming appointments.

## 2022-07-15 NOTE — Assessment & Plan Note (Signed)
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 Neoadjuvant chemotherapy with TCHP x6 completed 12/05/2021 followed by Kadcyla maintenance to start 01/23/2022. 2. 01/01/2022: Left lumpectomy: Grade 2 IDC 1.5 cm with intermediate grade DCIS, margins negative, lymphovascular invasion identified, 2/6 lymph nodes positive ER 90%, PR 90%, Ki-67 5%, HER2 1+ 3. Followed by adjuvant radiation therapy completed 04/10/2022 4.  Followed by adjuvant antiestrogen therapy ------------------------------------------------------------------------------------------------------------------------------ Pathology discussion: I received a phone call from [facility] from Dr. Loraine Leriche who stated that the original pathology suggesting that her tumor was HER2 positive was an error and upon repeat analysis it was found to be HER2 negative by FISH.  I discussed with the patient that the combination of Taxotere and carboplatin chemotherapy would have benefited her but Herceptin and Perjeta and subsequently Kadcyla did not provide her any significant clinical benefit.  It is for this reason, I recommended discontinuation of Kadcyla at this time.  The final pathology specimen was estrogen progesterone positive and therefore I recommended starting her on antiestrogen therapy and removal of the port.

## 2022-07-16 ENCOUNTER — Encounter: Payer: Self-pay | Admitting: Hematology and Oncology

## 2022-07-16 ENCOUNTER — Other Ambulatory Visit: Payer: Self-pay

## 2022-07-19 ENCOUNTER — Other Ambulatory Visit: Payer: Self-pay

## 2022-07-23 ENCOUNTER — Ambulatory Visit: Payer: Self-pay | Admitting: Surgery

## 2022-07-23 ENCOUNTER — Encounter: Payer: Self-pay | Admitting: *Deleted

## 2022-07-23 NOTE — Progress Notes (Signed)
Per MD pt cleared to have port a cath removed.  RN sent in-basket to Dr. Luisa Hart and his RN to set up appointment with pt.

## 2022-07-24 ENCOUNTER — Ambulatory Visit: Payer: 59

## 2022-07-24 ENCOUNTER — Other Ambulatory Visit: Payer: 59

## 2022-07-24 ENCOUNTER — Inpatient Hospital Stay: Payer: 59 | Admitting: Hematology and Oncology

## 2022-07-24 ENCOUNTER — Inpatient Hospital Stay: Payer: 59

## 2022-07-25 ENCOUNTER — Other Ambulatory Visit: Payer: Self-pay | Admitting: Hematology and Oncology

## 2022-07-25 DIAGNOSIS — Z170 Estrogen receptor positive status [ER+]: Secondary | ICD-10-CM

## 2022-08-08 ENCOUNTER — Ambulatory Visit: Payer: Self-pay | Admitting: Surgery

## 2022-08-14 ENCOUNTER — Ambulatory Visit: Payer: 59

## 2022-08-14 ENCOUNTER — Ambulatory Visit: Payer: 59 | Admitting: Hematology and Oncology

## 2022-08-14 ENCOUNTER — Other Ambulatory Visit: Payer: 59

## 2022-08-21 ENCOUNTER — Other Ambulatory Visit: Payer: Self-pay

## 2022-08-21 ENCOUNTER — Encounter (HOSPITAL_BASED_OUTPATIENT_CLINIC_OR_DEPARTMENT_OTHER): Payer: Self-pay | Admitting: Surgery

## 2022-08-28 NOTE — Progress Notes (Signed)

## 2022-09-01 ENCOUNTER — Encounter (HOSPITAL_BASED_OUTPATIENT_CLINIC_OR_DEPARTMENT_OTHER): Payer: Self-pay | Admitting: Surgery

## 2022-09-01 NOTE — Anesthesia Preprocedure Evaluation (Signed)
Anesthesia Evaluation  Patient identified by MRN, date of birth, ID band Patient awake    Reviewed: Allergy & Precautions, H&P , NPO status , Patient's Chart, lab work & pertinent test results  Airway Mallampati: II  TM Distance: >3 FB Neck ROM: Full    Dental no notable dental hx.    Pulmonary neg pulmonary ROS   Pulmonary exam normal breath sounds clear to auscultation       Cardiovascular hypertension, Normal cardiovascular exam Rhythm:Regular Rate:Normal  Hx/o HTN Indwelling Port a Cath   Neuro/Psych   Anxiety     negative neurological ROS  negative psych ROS   GI/Hepatic negative GI ROS, Neg liver ROS,,,  Endo/Other  Hypothyroidism  Hyperlipidemia Right breast Ca S/P lumpectomy, axillary node biopsy and ChemoRx  Renal/GU negative Renal ROS  negative genitourinary   Musculoskeletal negative musculoskeletal ROS (+)    Abdominal   Peds negative pediatric ROS (+)  Hematology negative hematology ROS (+) Thrombocytopenia   Anesthesia Other Findings   Reproductive/Obstetrics negative OB ROS                             Anesthesia Physical Anesthesia Plan  ASA: 2  Anesthesia Plan: MAC   Post-op Pain Management: Minimal or no pain anticipated   Induction: Intravenous  PONV Risk Score and Plan: 2 and Treatment may vary due to age or medical condition and Propofol infusion  Airway Management Planned: Simple Face Mask  Additional Equipment: None  Intra-op Plan:   Post-operative Plan:   Informed Consent: I have reviewed the patients History and Physical, chart, labs and discussed the procedure including the risks, benefits and alternatives for the proposed anesthesia with the patient or authorized representative who has indicated his/her understanding and acceptance.     Dental advisory given  Plan Discussed with: CRNA and Surgeon  Anesthesia Plan Comments:         Anesthesia Quick Evaluation

## 2022-09-02 ENCOUNTER — Encounter (HOSPITAL_BASED_OUTPATIENT_CLINIC_OR_DEPARTMENT_OTHER): Payer: Self-pay | Admitting: Surgery

## 2022-09-02 ENCOUNTER — Encounter (HOSPITAL_BASED_OUTPATIENT_CLINIC_OR_DEPARTMENT_OTHER)
Admission: RE | Disposition: A | Discharge status: Home / Self Care | Payer: Self-pay | Source: Ambulatory Visit | Attending: Surgery

## 2022-09-02 ENCOUNTER — Ambulatory Visit (HOSPITAL_BASED_OUTPATIENT_CLINIC_OR_DEPARTMENT_OTHER)
Admission: RE | Admit: 2022-09-02 | Discharge: 2022-09-02 | Disposition: A | Discharge status: Home / Self Care | Payer: 59 | Source: Ambulatory Visit | Attending: Surgery | Admitting: Surgery

## 2022-09-02 ENCOUNTER — Ambulatory Visit (HOSPITAL_BASED_OUTPATIENT_CLINIC_OR_DEPARTMENT_OTHER): Payer: 59 | Admitting: Anesthesiology

## 2022-09-02 ENCOUNTER — Other Ambulatory Visit: Payer: Self-pay

## 2022-09-02 DIAGNOSIS — Z79899 Other long term (current) drug therapy: Secondary | ICD-10-CM | POA: Diagnosis not present

## 2022-09-02 DIAGNOSIS — Z170 Estrogen receptor positive status [ER+]: Secondary | ICD-10-CM | POA: Insufficient documentation

## 2022-09-02 DIAGNOSIS — E785 Hyperlipidemia, unspecified: Secondary | ICD-10-CM | POA: Diagnosis not present

## 2022-09-02 DIAGNOSIS — Z452 Encounter for adjustment and management of vascular access device: Secondary | ICD-10-CM | POA: Diagnosis present

## 2022-09-02 DIAGNOSIS — Z853 Personal history of malignant neoplasm of breast: Secondary | ICD-10-CM | POA: Diagnosis not present

## 2022-09-02 DIAGNOSIS — Z9221 Personal history of antineoplastic chemotherapy: Secondary | ICD-10-CM | POA: Insufficient documentation

## 2022-09-02 DIAGNOSIS — E039 Hypothyroidism, unspecified: Secondary | ICD-10-CM | POA: Diagnosis not present

## 2022-09-02 DIAGNOSIS — I10 Essential (primary) hypertension: Secondary | ICD-10-CM

## 2022-09-02 DIAGNOSIS — F419 Anxiety disorder, unspecified: Secondary | ICD-10-CM | POA: Diagnosis not present

## 2022-09-02 DIAGNOSIS — Z79890 Hormone replacement therapy (postmenopausal): Secondary | ICD-10-CM | POA: Insufficient documentation

## 2022-09-02 DIAGNOSIS — Z01818 Encounter for other preprocedural examination: Secondary | ICD-10-CM

## 2022-09-02 HISTORY — DX: Aortic aneurysm of unspecified site, without rupture: I71.9

## 2022-09-02 HISTORY — PX: PORT-A-CATH REMOVAL: SHX5289

## 2022-09-02 SURGERY — REMOVAL PORT-A-CATH
Anesthesia: Monitor Anesthesia Care | Site: Chest

## 2022-09-02 MED ORDER — OXYCODONE HCL 5 MG/5ML PO SOLN: 5.0000 mg | Freq: Once | ORAL | Status: DC | PRN

## 2022-09-02 MED ORDER — GABAPENTIN 300 MG PO CAPS
300.0000 mg | ORAL_CAPSULE | ORAL | Status: AC
  Administered 2022-09-02: 300 mg via ORAL

## 2022-09-02 MED ORDER — CHLORHEXIDINE GLUCONATE CLOTH 2 % EX PADS: 6.0000 | MEDICATED_PAD | Freq: Once | CUTANEOUS | Status: DC

## 2022-09-02 MED ORDER — BUPIVACAINE-EPINEPHRINE 0.25% -1:200000 IJ SOLN
INTRAMUSCULAR | Status: DC | PRN
  Administered 2022-09-02: 14 mL

## 2022-09-02 MED ORDER — CEFAZOLIN SODIUM-DEXTROSE 2-4 GM/100ML-% IV SOLN
2.0000 g | INTRAVENOUS | Status: AC
  Administered 2022-09-02: 2 g via INTRAVENOUS

## 2022-09-02 MED ORDER — FENTANYL CITRATE (PF) 100 MCG/2ML IJ SOLN
INTRAMUSCULAR | Status: DC | PRN
  Administered 2022-09-02: 50 ug via INTRAVENOUS

## 2022-09-02 MED ORDER — OXYCODONE HCL 5 MG PO TABS: 5.0000 mg | ORAL_TABLET | Freq: Once | ORAL | Status: DC | PRN

## 2022-09-02 MED ORDER — LIDOCAINE 2% (20 MG/ML) 5 ML SYRINGE
INTRAMUSCULAR | Status: AC
  Filled 2022-09-02: qty 5

## 2022-09-02 MED ORDER — CEFAZOLIN SODIUM-DEXTROSE 2-4 GM/100ML-% IV SOLN
INTRAVENOUS | Status: AC
  Filled 2022-09-02: qty 100

## 2022-09-02 MED ORDER — GABAPENTIN 300 MG PO CAPS
ORAL_CAPSULE | ORAL | Status: AC
  Filled 2022-09-02: qty 1

## 2022-09-02 MED ORDER — PHENYLEPHRINE HCL (PRESSORS) 10 MG/ML IV SOLN
INTRAVENOUS | Status: DC | PRN
  Administered 2022-09-02: 160 ug via INTRAVENOUS

## 2022-09-02 MED ORDER — PHENYLEPHRINE 80 MCG/ML (10ML) SYRINGE FOR IV PUSH (FOR BLOOD PRESSURE SUPPORT)
PREFILLED_SYRINGE | INTRAVENOUS | Status: AC
  Filled 2022-09-02: qty 10

## 2022-09-02 MED ORDER — ACETAMINOPHEN 500 MG PO TABS
ORAL_TABLET | ORAL | Status: AC
  Filled 2022-09-02: qty 2

## 2022-09-02 MED ORDER — LACTATED RINGERS IV SOLN: INTRAVENOUS | Status: DC

## 2022-09-02 MED ORDER — MIDAZOLAM HCL 5 MG/5ML IJ SOLN
INTRAMUSCULAR | Status: DC | PRN
  Administered 2022-09-02 (×2): 1 mg via INTRAVENOUS

## 2022-09-02 MED ORDER — MIDAZOLAM HCL 2 MG/2ML IJ SOLN
INTRAMUSCULAR | Status: AC
  Filled 2022-09-02: qty 2

## 2022-09-02 MED ORDER — PROPOFOL 500 MG/50ML IV EMUL
INTRAVENOUS | Status: DC | PRN
  Administered 2022-09-02: 100 ug/kg/min via INTRAVENOUS
  Administered 2022-09-02: 4 ug/kg/min via INTRAVENOUS

## 2022-09-02 MED ORDER — PROPOFOL 10 MG/ML IV BOLUS
INTRAVENOUS | Status: DC | PRN
  Administered 2022-09-02: 20 mg via INTRAVENOUS

## 2022-09-02 MED ORDER — CEFAZOLIN SODIUM-DEXTROSE 2-4 GM/100ML-% IV SOLN: 2.0000 g | INTRAVENOUS | Status: DC

## 2022-09-02 MED ORDER — IBUPROFEN 800 MG PO TABS
800.0000 mg | ORAL_TABLET | Freq: Three times a day (TID) | ORAL | 0 refills | Status: DC | PRN
Start: 2022-09-02 — End: 2023-10-21

## 2022-09-02 MED ORDER — FENTANYL CITRATE (PF) 100 MCG/2ML IJ SOLN
INTRAMUSCULAR | Status: AC
  Filled 2022-09-02: qty 2

## 2022-09-02 MED ORDER — ACETAMINOPHEN 500 MG PO TABS
1000.0000 mg | ORAL_TABLET | ORAL | Status: AC
  Administered 2022-09-02: 1000 mg via ORAL

## 2022-09-02 MED ORDER — ONDANSETRON HCL 4 MG/2ML IJ SOLN: 4.0000 mg | Freq: Once | INTRAMUSCULAR | Status: DC | PRN

## 2022-09-02 MED ORDER — ONDANSETRON HCL 4 MG/2ML IJ SOLN
INTRAMUSCULAR | Status: DC | PRN
  Administered 2022-09-02: 4 mg via INTRAVENOUS

## 2022-09-02 MED ORDER — FENTANYL CITRATE (PF) 100 MCG/2ML IJ SOLN: 25.0000 ug | INTRAMUSCULAR | Status: DC | PRN

## 2022-09-02 SURGICAL SUPPLY — 33 items
ADH SKN CLS APL DERMABOND .7 (GAUZE/BANDAGES/DRESSINGS) ×1
APL PRP STRL LF DISP 70% ISPRP (MISCELLANEOUS) ×1
APL SKNCLS STERI-STRIP NONHPOA (GAUZE/BANDAGES/DRESSINGS)
BENZOIN TINCTURE PRP APPL 2/3 (GAUZE/BANDAGES/DRESSINGS) IMPLANT
BLADE SURG 15 STRL LF DISP TIS (BLADE) ×1 IMPLANT
BLADE SURG 15 STRL SS (BLADE) ×1
CHLORAPREP W/TINT 26 (MISCELLANEOUS) ×1 IMPLANT
COVER BACK TABLE 60X90IN (DRAPES) ×1 IMPLANT
COVER MAYO STAND STRL (DRAPES) ×1 IMPLANT
DERMABOND ADVANCED .7 DNX12 (GAUZE/BANDAGES/DRESSINGS) ×1 IMPLANT
DRAPE LAPAROTOMY 100X72 PEDS (DRAPES) ×1 IMPLANT
DRAPE UTILITY XL STRL (DRAPES) ×1 IMPLANT
ELECT REM PT RETURN 9FT ADLT (ELECTROSURGICAL) ×1
ELECTRODE REM PT RTRN 9FT ADLT (ELECTROSURGICAL) ×1 IMPLANT
GLOVE BIOGEL PI IND STRL 8 (GLOVE) ×1 IMPLANT
GLOVE ECLIPSE 8.0 STRL XLNG CF (GLOVE) ×1 IMPLANT
GOWN STRL REUS W/ TWL LRG LVL3 (GOWN DISPOSABLE) ×2 IMPLANT
GOWN STRL REUS W/ TWL XL LVL3 (GOWN DISPOSABLE) ×1 IMPLANT
GOWN STRL REUS W/TWL LRG LVL3 (GOWN DISPOSABLE) ×2
GOWN STRL REUS W/TWL XL LVL3 (GOWN DISPOSABLE) ×1
NDL HYPO 25X1 1.5 SAFETY (NEEDLE) ×1 IMPLANT
NEEDLE HYPO 25X1 1.5 SAFETY (NEEDLE) ×1 IMPLANT
NS IRRIG 1000ML POUR BTL (IV SOLUTION) ×1 IMPLANT
PACK BASIN DAY SURGERY FS (CUSTOM PROCEDURE TRAY) ×1 IMPLANT
PENCIL SMOKE EVACUATOR (MISCELLANEOUS) IMPLANT
SLEEVE SCD COMPRESS KNEE MED (STOCKING) IMPLANT
SPIKE FLUID TRANSFER (MISCELLANEOUS) ×1 IMPLANT
SPONGE T-LAP 4X18 ~~LOC~~+RFID (SPONGE) ×1 IMPLANT
STRIP CLOSURE SKIN 1/2X4 (GAUZE/BANDAGES/DRESSINGS) IMPLANT
SUT MON AB 4-0 PC3 18 (SUTURE) ×1 IMPLANT
SUT VICRYL 3-0 CR8 SH (SUTURE) ×1 IMPLANT
SYR CONTROL 10ML LL (SYRINGE) ×1 IMPLANT
TOWEL GREEN STERILE FF (TOWEL DISPOSABLE) ×1 IMPLANT

## 2022-09-02 NOTE — Discharge Instructions (Addendum)
#######################################################  GENERAL SURGERY: POST OP INSTRUCTIONS  ######################################################################  EAT Gradually transition to a high fiber diet with a fiber supplement over the next few weeks after discharge.  Start with a pureed / full liquid diet (see below)  WALK Walk an hour a day.  Control your pain to do that.    CONTROL PAIN Control pain so that you can walk, sleep, tolerate sneezing/coughing, go up/down stairs.  HAVE A BOWEL MOVEMENT DAILY Keep your bowels regular to avoid problems.  OK to try a laxative to override constipation.  OK to use an antidairrheal to slow down diarrhea.  Call if not better after 2 tries  CALL IF YOU HAVE PROBLEMS/CONCERNS Call if you are still struggling despite following these instructions. Call if you have concerns not answered by these instructions  ######################################################################    DIET: Follow a light bland diet & liquids the first 24 hours after arrival home, such as soup, liquids, starches, etc.  Be sure to drink plenty of fluids.  Quickly advance to a usual solid diet within a few days.  Avoid fast food or heavy meals as your are more likely to get nauseated or have irregular bowels.  A low-fat, high-fiber diet for the rest of your life is ideal.    Take your usually prescribed home medications unless otherwise directed.  PAIN CONTROL: Pain is best controlled by a usual combination of three different methods TOGETHER: Ice/Heat Over the counter pain medication Prescription pain medication Most patients will experience some swelling and bruising around the incisions.  Ice packs or heating pads (30-60 minutes up to 6 times a day) will help. Use ice for the first few days to help decrease swelling and bruising, then switch to heat to help relax tight/sore spots and speed recovery.  Some people prefer to use ice alone, heat alone,  alternating between ice & heat.  Experiment to what works for you.  Swelling and bruising can take several weeks to resolve.   It is helpful to take an over-the-counter pain medication regularly for the first few weeks.  Choose one of the following that works best for you: Naproxen (Aleve, etc)  Two 220mg  tabs twice a day Ibuprofen (Advil, etc) Three 200mg  tabs four times a day (every meal & bedtime) Acetaminophen (Tylenol, etc) 500-650mg  four times a day (every meal & bedtime) A  prescription for pain medication (such as oxycodone, hydrocodone, etc) should be given to you upon discharge.  Take your pain medication as prescribed.  If you are having problems/concerns with the prescription medicine (does not control pain, nausea, vomiting, rash, itching, etc), please call us [phone] to see if we need to switch you to a different pain medicine that will work better for you and/or control your side effect better. If you need a refill on your pain medication, please contact your pharmacy.  They will contact our office to request authorization. Prescriptions will not be filled after 5 pm or on week-ends.  Avoid getting constipated.  Between the surgery and the pain medications, it is common to experience some constipation.  Increasing fluid intake and taking a fiber supplement (such as Metamucil, Citrucel, FiberCon, MiraLax, etc) 1-2 times a day regularly will usually help prevent this problem from occurring.  A mild laxative (prune juice, Milk of Magnesia, MiraLax, etc) should be taken according to package directions if there are no bowel movements after 48 hours.   Watch out for diarrhea.  If you have many loose bowel movements, simplify your  diet to bland foods & liquids for a few days.  Stop any stool softeners and decrease your fiber supplement.  Switching to mild anti-diarrheal medications (Loperamide/Imodium, Kayopectate, Pepto Bismol) can help.  If this worsens or does not improve, please call  us.  Wash / shower every day.  You may shower over the dressings as they are waterproof.  Continue to shower over incision(s) after the dressing is off. Remove your waterproof bandages 5 days after surgery.  You may leave the incision open to air.  You may have skin tapes (Steri Strips) covering the incision(s).  Leave them on until one week, then remove.  You may replace a dressing/Band-Aid to cover the incision for comfort if you wish.   ACTIVITIES as tolerated:   You may resume regular (light) daily activities beginning the next day--such as daily self-care, walking, climbing stairs--gradually increasing activities as tolerated.  If you can walk 30 minutes without difficulty, it is safe to try more intense activity such as jogging, treadmill, bicycling, low-impact aerobics, swimming, etc. Save the most intensive and strenuous activity for last such as sit-ups, heavy lifting, contact sports, etc  Refrain from any heavy lifting or straining until you are off narcotics for pain control.   DO NOT PUSH THROUGH PAIN.  Let pain be your guide: If it hurts to do something, don't do it.  Pain is your body warning you to avoid that activity for another week until the pain goes down. You may drive when you are no longer taking prescription pain medication, you can comfortably wear a seatbelt, and you can safely maneuver your car and apply brakes. You may have sexual intercourse when it is comfortable.   FOLLOW UP in our office Please call [facility] at [phone] to set up an appointment to see your surgeon in the office for a follow-up appointment approximately 2-3 weeks after your surgery. Make sure that you call for this appointment the day you arrive home to insure a convenient appointment time.  9. IF YOU HAVE DISABILITY OR FAMILY LEAVE FORMS, BRING THEM TO THE OFFICE FOR PROCESSING.  DO NOT GIVE THEM TO YOUR DOCTOR.   WHEN TO CALL us [phone]: Poor pain control Reactions / problems with new  medications (rash/itching, nausea, etc)  Fever over 101.5 F (38.5 C) Worsening swelling or bruising Continued bleeding from incision. Increased pain, redness, or drainage from the incision Difficulty breathing / swallowing   The clinic staff is available to answer your questions during regular business hours (8:30am-5pm).  Please don't hesitate to call and ask to speak to one of our nurses for clinical concerns.   If you have a medical emergency, go to the nearest emergency room or call 911.  A surgeon from [facility] is always on call at the [facility], [place] [address], [address], [place], [place]  [address] ? MAIN: [phone] ? TOLL FREE: [phone] ?  FAX [phone] [email or web address]  #######################################################    Post Anesthesia Home Care Instructions  Activity: Get plenty of rest for the remainder of the day. A responsible individual must stay with you for 24 hours following the procedure.  For the next 24 hours, DO NOT: -Drive a car -Advertising copywriter -Drink alcoholic beverages -Take any medication unless instructed by your physician -Make any legal decisions or sign important papers.  Meals: Start with liquid foods such as gelatin or soup. Progress to regular foods as tolerated. Avoid greasy, spicy, heavy foods.  If nausea and/or vomiting occur, drink only clear liquids until the nausea and/or vomiting subsides. Call your physician if vomiting continues.  Special Instructions/Symptoms: Your throat may feel dry or sore from the anesthesia or the breathing tube placed in your throat during surgery. If this causes discomfort, gargle with warm salt water. The discomfort should disappear within 24 hours.  If you had a scopolamine patch placed behind your ear for the management of post- operative nausea and/or vomiting:  1. The medication in the patch is effective for 72 hours,  after which it should be removed.  Wrap patch in a tissue and discard in the trash. Wash hands thoroughly with soap and water. 2. You may remove the patch earlier than 72 hours if you experience unpleasant side effects which may include dry mouth, dizziness or visual disturbances. 3. Avoid touching the patch. Wash your hands with soap and water after contact with the patch.    No tylenol until after 6:15pm today if needed.

## 2022-09-02 NOTE — Interval H&P Note (Signed)
History and Physical Interval Note:  09/02/2022 11:07 AM  Amber Mccormick  has presented today for surgery, with the diagnosis of PORT REMOVAL.  The various methods of treatment have been discussed with the patient and family. After consideration of risks, benefits and other options for treatment, the patient has consented to  Procedure(s): REMOVAL PORT-A-CATH (N/A) as a surgical intervention.  The patient's history has been reviewed, patient examined, no change in status, stable for surgery.  I have reviewed the patient's chart and labs.  Questions were answered to the patient's satisfaction.     Saniya Tranchina A Cathlyn Tersigni

## 2022-09-02 NOTE — H&P (Signed)
History of Present Illness: Amber Mccormick is a 67 y.o. female who is seen today for postop check 7 months after left breast lumpectomy and sentinel lymph node mapping for stage I left breast cancer. She is finishing chemotherapy. She no longer requires her port..    Review of Systems: A complete review of systems was obtained from the patient. I have reviewed this information and discussed as appropriate with the patient. See HPI as well for other ROS.    Medical History: Past Medical History: Diagnosis Date Anxiety History of cancer Thyroid disease  Patient Active Problem List Diagnosis Malignant neoplasm of upper-outer quadrant of left breast in female, estrogen receptor positive (CMS/HHS-HCC) Abscess of axilla, left  Past Surgical History: Procedure Laterality Date MOHS 1 STAGE HEAD/NECK/HAND/FEET/GENTIAL   No Known Allergies  Current Outpatient Medications on File Prior to Visit Medication Sig Dispense Refill anastrozole (ARIMIDEX) 1 mg tablet Take 1 mg by mouth once daily atorvastatin (LIPITOR) 10 MG tablet Take 10 mg by mouth once daily ascorbic acid (VITA-C ORAL) Take by mouth Ca comb no.1/vit D3/B6/FA/B12 (VITAMIN D3, CALCIUM CIT-PHOS, ORAL) Take by mouth cetirizine HCl (ALLERGY RELIEF, CETIRIZINE, ORAL) Take by mouth cyanocobalamin (VITAMIN B12) 500 MCG tablet Take 500 mcg by mouth once daily levothyroxine (SYNTHROID) 50 MCG tablet TAKE 1 TABLET(50 MCG) BY MOUTH DAILY AT 6 AM metroNIDAZOLE (METROGEL) 0.75 % (37.5mg /5 gram) vaginal gel Place 1 applicator vaginally 2 (two) times daily  No current facility-administered medications on file prior to visit.  Family History Problem Relation Age of Onset Pancreatic cancer Father   Social History  Tobacco Use Smoking Status Never Smokeless Tobacco Never   Social History  Socioeconomic History Marital status: Married Tobacco Use Smoking status: Never Smokeless tobacco: Never Substance and Sexual  Activity Alcohol use: Never Drug use: Never  Objective:  There were no vitals filed for this visit. There is no height or weight on file to calculate BMI.  Physical Exam HENT: Head: Normocephalic. Eyes: Pupils: Pupils are equal, round, and reactive to light. Cardiovascular: Rate and Rhythm: Normal rate. Chest:  Comments: Left breast shows postsurgical change. Scars of healed. Right chest shows port in place. Musculoskeletal: General: Normal range of motion. Cervical back: Normal range of motion. Lymphadenopathy: Upper Body: Right upper body: No supraclavicular or axillary adenopathy. Left upper body: No supraclavicular or axillary adenopathy. Skin: General: Skin is warm. Neurological: General: No focal deficit present. Mental Status: She is alert.    Assessment and Plan:  Diagnoses and all orders for this visit:  Malignant neoplasm of upper-outer quadrant of left breast in female, estrogen receptor positive (CMS/HHS-HCC)  Port-A-Cath in place    Overall doing well  Plan port removal  Otherwise she has no evidence of any recurrent disease  Discussed the procedure and the risk and benefits of doing so. Risk of bleeding, infection, air embolus, catheter fragmentation, cardiovascular event, wound complication and injury to major blood vessels and/or nerves. She agrees to proceed.  No follow-ups on file.  Hayden Rasmussen, MD

## 2022-09-02 NOTE — Op Note (Signed)
Preop diagnosis: Indwelling port a catheter for chemotherapy  Postop diagnosis: Same  Procedure: Removal of port a catheter  Surgeon: Antanisha Mohs M.D.  Anesthesia: MAC with local  EBL: Minimal  Specimen none  Drains: None  Indications for procedure: The patient presents for removal of port a catheter after completing chemotherapy. The patient no longer requires central venous access. Risks of bleeding, infection, catheter fragmentation, embolization, arrhythmias and damage to arteries, veins and nerves and possibly other mediastinal structures discussed. The patient agrees to proceed.  Description of procedure: The patient was seen in the holding area. Questions were answered. The patient agreed to proceed. The patient was taken to the operating room. The patient was placed supine. Anesthesia was initiated. The skin on the upper chest was prepped and draped in a sterile fashion. Timeout was done. The patient received preoperative antibiotics. Incision was made through the old port site and the hub of the Port-A-Cath was seen. The sutures were cut to release the port from the chest wall. The catheter was removed in its entirety without difficulty. The tract was closed with 3-0 Vicryl. 4 Monocryl was used to close the skin. All final counts were correct. The patient was taken to recovery in satisfactory condition. 

## 2022-09-02 NOTE — Transfer of Care (Signed)
Immediate Anesthesia Transfer of Care Note  Patient: Amber Mccormick  Procedure(s) Performed: REMOVAL PORT-A-CATH (Chest)  Patient Location: PACU  Anesthesia Type:MAC  Level of Consciousness: awake, alert , and oriented  Airway & Oxygen Therapy: Patient Spontanous Breathing and Patient connected to face mask oxygen  Post-op Assessment: Report given to RN and Post -op Vital signs reviewed and stable  Post vital signs: Reviewed and stable  Last Vitals:  Vitals Value Taken Time  BP 96/51 09/02/22 1205  Temp 36.3 C 09/02/22 1204  Pulse 79 09/02/22 1208  Resp 15 09/02/22 1208  SpO2 99 % 09/02/22 1208  Vitals shown include unvalidated device data.  Last Pain:  Vitals:   09/02/22 1003  TempSrc: Oral  PainSc: 0-No pain      Patients Stated Pain Goal: 2 (09/02/22 1003)  Complications: No notable events documented.

## 2022-09-02 NOTE — Anesthesia Postprocedure Evaluation (Signed)
Anesthesia Post Note  Patient: Amber Mccormick  Procedure(s) Performed: REMOVAL PORT-A-CATH (Chest)     Patient location during evaluation: PACU Anesthesia Type: MAC Level of consciousness: awake and alert Pain management: pain level controlled Vital Signs Assessment: post-procedure vital signs reviewed and stable Respiratory status: spontaneous breathing, nonlabored ventilation, respiratory function stable and patient connected to nasal cannula oxygen Cardiovascular status: stable and blood pressure returned to baseline Postop Assessment: no apparent nausea or vomiting Anesthetic complications: no  No notable events documented.  Last Vitals:  Vitals:   09/02/22 1205 09/02/22 1215  BP: (!) 96/51 101/63  Pulse: 80 81  Resp: 16 18  Temp: (!) 36.3 C   SpO2: 98% 94%    Last Pain:  Vitals:   09/02/22 1231  TempSrc:   PainSc: 0-No pain                 Dillian Feig S

## 2022-09-03 ENCOUNTER — Encounter (HOSPITAL_BASED_OUTPATIENT_CLINIC_OR_DEPARTMENT_OTHER): Payer: Self-pay | Admitting: Surgery

## 2022-09-04 ENCOUNTER — Ambulatory Visit: Payer: 59 | Admitting: Hematology and Oncology

## 2022-09-04 ENCOUNTER — Encounter: Payer: Self-pay | Admitting: *Deleted

## 2022-09-04 ENCOUNTER — Other Ambulatory Visit: Payer: 59

## 2022-09-04 ENCOUNTER — Ambulatory Visit: Payer: 59

## 2022-09-04 DIAGNOSIS — Z170 Estrogen receptor positive status [ER+]: Secondary | ICD-10-CM

## 2022-10-11 NOTE — Progress Notes (Signed)
Patient Care Team: Barrington Ellison as PCP - General (Physician Assistant) Pershing Proud, RN as Oncology Nurse Navigator Donnelly Angelica, RN as Oncology Nurse Navigator Serena Croissant, MD as Consulting Physician (Hematology and Oncology) Rollene Rotunda, MD as Consulting Physician (Cardiology) Durene Romans, MD as Consulting Physician (Orthopedic Surgery)  DIAGNOSIS:  Encounter Diagnosis  Name Primary?   Malignant neoplasm of upper-outer quadrant of left breast in female, estrogen receptor positive (HCC) Yes    SUMMARY OF ONCOLOGIC HISTORY: Oncology History  Malignant neoplasm of upper-outer quadrant of left breast, estrogen receptor positive (HCC)  07/01/2021 Initial Diagnosis   Screening mammogram 06/27/2021 detected abnormality in the left breast which led to diagnostic mammogram and left breast ultrasound that revealed hypoechoic mass with irregular borders 2:30 position 2 cm left axilla: 1 abnormal lymph node, grade 2 IDC with DCIS ER 99%, PR 95%, HER2 2+ by IHC, FISH positive, Ki-67 20%, left axilla lymph node: Positive   08/08/2021 Cancer Staging   Staging form: Breast, AJCC 8th Edition - Clinical stage from 08/08/2021: Stage IB (cT1c, cN1(f), cM0, G2, ER+, PR+, HER2+) - Signed by Serena Croissant, MD on 08/08/2021 Stage prefix: Initial diagnosis Method of lymph node assessment: Core biopsy Histologic grading system: 3 grade system   08/14/2021 - 12/26/2021 Chemotherapy   Patient is on Treatment Plan : BREAST  Docetaxel + Carboplatin + Trastuzumab + Pertuzumab  (TCHP) q21d / Trastuzumab + Pertuzumab q21d     08/22/2021 - 11/14/2021 Chemotherapy   Patient is on Treatment Plan : BREAST  Docetaxel + Carboplatin + Trastuzumab + Pertuzumab  (TCHP) q21d      01/01/2022 Surgery   Left lumpectomy: Grade 2 IDC 1.5 cm with intermediate grade DCIS, margins negative, lymphovascular invasion identified, 2/6 lymph nodes positive ER 90%, PR 90%, Ki-67 5%, HER2 1+    01/22/2022 -  Chemotherapy    Every 3 weeks x 11 cycles Patient is on Treatment Plan : BREAST ADO-Trastuzumab Emtansine (Kadcyla) q21d      02/13/2022 - 04/10/2022 Radiation Therapy   Site Technique Total Dose (Gy) Dose per Fx (Gy) Completed Fx Beam Energies  Breast, Left: Breast_L 3D 50.4/50.4 1.8 28/28 6X, 10X  Breast, Left: Breast_L_SCV_PAB 3D 50.4/50.4 1.8 28/28 6X, 10X  Breast, Left: Breast_L_Bst 3D 10/10 2 5/5 6X, 10X       CHIEF COMPLIANT: Follow-up left breast cancer on anastrozole  INTERVAL HISTORY: Amber Mccormick is a 67 y.o with the above-mentioned left breast cancer currently on anastrozole therapy. She presents to the clinic for a follow-up. Pt reports that she is tolerating the anastrozole fairly well. She says some situations has got worst. She does feel a little grumpy and some anxiety. Complains of lack of sleep. She has some numbness in feet and have some fatigue. She does walk regularly. Denies any hot flashes.   ALLERGIES:  has No Known Allergies.  MEDICATIONS:  Current Outpatient Medications  Medication Sig Dispense Refill   ALPRAZolam (XANAX) 0.25 MG tablet Take 0.25 mg by mouth at bedtime as needed for sleep.     amitriptyline (ELAVIL) 10 MG tablet Take 10 mg by mouth at bedtime.     amLODipine (NORVASC) 5 MG tablet Take 5 mg by mouth daily.     anastrozole (ARIMIDEX) 1 MG tablet Take 1 tablet (1 mg total) by mouth daily. 90 tablet 3   atorvastatin (LIPITOR) 10 MG tablet Take 10 mg by mouth daily.     cholecalciferol (VITAMIN D3) 25 MCG (1000 UNIT) tablet Take 1,000  Units by mouth daily.     diphenhydrAMINE (BENADRYL) 25 MG tablet Take 25 mg by mouth every 6 (six) hours as needed.     diphenoxylate-atropine (LOMOTIL) 2.5-0.025 MG tablet Take 1 tablet by mouth 4 (four) times daily as needed for diarrhea or loose stools. 30 tablet 3   ibuprofen (ADVIL) 800 MG tablet Take 1 tablet (800 mg total) by mouth every 8 (eight) hours as needed. 30 tablet 0   ibuprofen (ADVIL) 800 MG tablet Take 1  tablet (800 mg total) by mouth every 8 (eight) hours as needed. 30 tablet 0   levothyroxine (SYNTHROID) 50 MCG tablet Take 50 mcg by mouth daily.     metroNIDAZOLE (METROCREAM) 0.75 % cream Apply topically 2 (two) times daily as needed.     vitamin B-12 (CYANOCOBALAMIN) 500 MCG tablet Take by mouth.     vitamin C (ASCORBIC ACID) 500 MG tablet Take 500 mg by mouth daily.     No current facility-administered medications for this visit.    PHYSICAL EXAMINATION: ECOG PERFORMANCE STATUS: 1 - Symptomatic but completely ambulatory  Vitals:   10/14/22 0950  BP: (!) 144/94  Pulse: 86  Resp: 18  Temp: 97.7 F (36.5 C)  SpO2: 95%   Filed Weights   10/14/22 0950  Weight: 153 lb 9.6 oz (69.7 kg)     LABORATORY DATA:  I have reviewed the data as listed    Latest Ref Rng & Units 07/03/2022    9:57 AM 06/11/2022   10:18 AM 05/22/2022    9:20 AM  CMP  Glucose 70 - 99 mg/dL 086  578  91   BUN 8 - 23 mg/dL 10  14  14    Creatinine 0.44 - 1.00 mg/dL 4.69  6.29  5.28   Sodium 135 - 145 mmol/L 140  139  140   Potassium 3.5 - 5.1 mmol/L 3.6  3.6  3.7   Chloride 98 - 111 mmol/L 105  104  105   CO2 22 - 32 mmol/L 29  30  31    Calcium 8.9 - 10.3 mg/dL 9.7  9.1  9.1   Total Protein 6.5 - 8.1 g/dL 7.0  6.9  7.0   Total Bilirubin 0.3 - 1.2 mg/dL 0.8  0.6  0.6   Alkaline Phos 38 - 126 U/L 107  125  114   AST 15 - 41 U/L 37  37  29   ALT 0 - 44 U/L 19  21  16      Lab Results  Component Value Date   WBC 3.4 (L) 07/03/2022   HGB 12.8 07/03/2022   HCT 37.7 07/03/2022   MCV 87.7 07/03/2022   PLT 113 (L) 07/03/2022   NEUTROABS 1.7 07/03/2022    ASSESSMENT & PLAN:  Malignant neoplasm of upper-outer quadrant of left breast, estrogen receptor positive (HCC) 07/01/2021:Screening mammogram 06/27/2021 detected abnormality in the left breast which led to diagnostic mammogram and left breast ultrasound that revealed hypoechoic mass with irregular borders 2:30 position 2 cm left axilla: 1 abnormal lymph  node, 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 Neoadjuvant chemotherapy with TCHP x6 completed 12/05/2021 followed by Kadcyla maintenance 01/23/2022.  (Discontinued when we realized that her original pathology was HER2 negative) 2. 01/01/2022: Left lumpectomy: Grade 2 IDC 1.5 cm with intermediate grade DCIS, margins negative, lymphovascular invasion identified, 2/6 lymph nodes positive ER 90%, PR  90%, Ki-67 5%, HER2 1+ 3. Followed by adjuvant radiation therapy completed 04/10/2022 4.  Followed by adjuvant antiestrogen therapy with anastrozole started 07/15/2022 ------------------------------------------------------------------------------------------------------------------- Anastrozole toxicities: Anxiety issues: Sleep issues: She started taking during daytime and this has helped. Fatigue  I encouraged her to exercise regularly and stay active so that the fatigue can get better.  Breast cancer surveillance: Patient will need to be set up for another mammogram.  She has an appointment for bone density to be done October.  We will set it up at the same time.  Return to clinic in 1 year for follow-up visit    Orders Placed This Encounter  Procedures   MM DIAG BREAST TOMO BILATERAL    Standing Status:   Future    Standing Expiration Date:   10/14/2023    Scheduling Instructions:     01/29/23 same time as bone density    Order Specific Question:   Reason for Exam (SYMPTOM  OR DIAGNOSIS REQUIRED)    Answer:   annual surveillance    Order Specific Question:   Preferred imaging location?    Answer:   [facility]   The patient has a good understanding of the overall plan. she agrees with it. she will call with any problems that may develop before the next visit here. Total time spent: 30 mins including face to face time and time spent for planning, charting and co-ordination of care   Tamsen Meek, MD 10/14/22    I Janan Ridge am acting as a Neurosurgeon for [employer]  I have reviewed the above documentation for accuracy and completeness, and I agree with the above.

## 2022-10-14 ENCOUNTER — Other Ambulatory Visit: Payer: Self-pay

## 2022-10-14 ENCOUNTER — Inpatient Hospital Stay: Payer: 59 | Attending: Hematology and Oncology | Admitting: Hematology and Oncology

## 2022-10-14 VITALS — BP 144/94 | HR 86 | Temp 97.7°F | Resp 18 | Ht 66.0 in | Wt 153.6 lb

## 2022-10-14 DIAGNOSIS — Z79899 Other long term (current) drug therapy: Secondary | ICD-10-CM | POA: Diagnosis not present

## 2022-10-14 DIAGNOSIS — C50412 Malignant neoplasm of upper-outer quadrant of left female breast: Secondary | ICD-10-CM | POA: Diagnosis present

## 2022-10-14 DIAGNOSIS — R5383 Other fatigue: Secondary | ICD-10-CM | POA: Diagnosis not present

## 2022-10-14 DIAGNOSIS — C773 Secondary and unspecified malignant neoplasm of axilla and upper limb lymph nodes: Secondary | ICD-10-CM | POA: Insufficient documentation

## 2022-10-14 DIAGNOSIS — Z170 Estrogen receptor positive status [ER+]: Secondary | ICD-10-CM | POA: Diagnosis not present

## 2022-10-14 DIAGNOSIS — Z923 Personal history of irradiation: Secondary | ICD-10-CM | POA: Diagnosis not present

## 2022-10-14 DIAGNOSIS — Z79811 Long term (current) use of aromatase inhibitors: Secondary | ICD-10-CM | POA: Insufficient documentation

## 2022-10-14 NOTE — Assessment & Plan Note (Addendum)
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 Neoadjuvant chemotherapy with TCHP x6 completed 12/05/2021 followed by Kadcyla maintenance 01/23/2022.  (Discontinued when we realized that her original pathology was HER2 negative) 2. 01/01/2022: Left lumpectomy: Grade 2 IDC 1.5 cm with intermediate grade DCIS, margins negative, lymphovascular invasion identified, 2/6 lymph nodes positive ER 90%, PR 90%, Ki-67 5%, HER2 1+ 3. Followed by adjuvant radiation therapy completed 04/10/2022 4.  Followed by adjuvant antiestrogen therapy with anastrozole started 07/15/2022 ------------------------------------------------------------------------------------------------------------------- Anastrozole toxicities: Anxiety issues: Sleep issues: She started taking during daytime and this has helped. Fatigue  I encouraged her to exercise regularly and stay active so that the fatigue can get better.  Breast cancer surveillance: Patient will need to be set up for another mammogram.  She has an appointment for bone density to be done October.  We will set it up at the same time.  Return to clinic in 1 year for follow-up visit

## 2022-10-15 ENCOUNTER — Telehealth: Payer: Self-pay | Admitting: Hematology and Oncology

## 2022-10-15 NOTE — Telephone Encounter (Signed)
Scheduled appointment per 7/16 los. Patient is aware of the made appointment.

## 2022-10-16 ENCOUNTER — Other Ambulatory Visit: Payer: Self-pay

## 2022-10-29 ENCOUNTER — Ambulatory Visit: Admission: RE | Admit: 2022-10-29 | Payer: 59 | Source: Ambulatory Visit

## 2022-10-29 DIAGNOSIS — Z170 Estrogen receptor positive status [ER+]: Secondary | ICD-10-CM

## 2023-01-06 ENCOUNTER — Other Ambulatory Visit: Payer: Self-pay | Admitting: Surgery

## 2023-01-06 DIAGNOSIS — I7121 Aneurysm of the ascending aorta, without rupture: Secondary | ICD-10-CM

## 2023-01-29 ENCOUNTER — Ambulatory Visit
Admission: RE | Admit: 2023-01-29 | Discharge: 2023-01-29 | Disposition: A | Discharge status: Home / Self Care | Payer: 59 | Source: Ambulatory Visit | Attending: Hematology and Oncology | Admitting: Hematology and Oncology

## 2023-01-29 DIAGNOSIS — Z780 Asymptomatic menopausal state: Secondary | ICD-10-CM

## 2023-01-29 DIAGNOSIS — Z170 Estrogen receptor positive status [ER+]: Secondary | ICD-10-CM

## 2023-02-06 ENCOUNTER — Telehealth: Payer: Self-pay | Admitting: *Deleted

## 2023-02-06 NOTE — Telephone Encounter (Signed)
Receive call from pt requesting appt with MD to discuss worsening bone density report and possibility of stopping anastrozole due to poor bone heath and LFT abnormalities.  Appt scheduled, pt verbalized understanding.

## 2023-02-24 NOTE — Progress Notes (Unsigned)
     [address]       [place] [address]             [phone]    Amber Mccormick [id number] 12/26/1955  History of Present Illness:  Amber Mccormick is a 67 yo female with history of HTN, HLD, Hypothyroidism, Breast Cancer, and Ascending Aortic Aneurysm.  This was found in 2023 and found to be 4.0 cm in size.  She presents today for 1 year follow up with repeat CTA Chest.    Current Outpatient Medications on File Prior to Visit  Medication Sig Dispense Refill   ALPRAZolam (XANAX) 0.25 MG tablet Take 0.25 mg by mouth at bedtime as needed for sleep.     amitriptyline (ELAVIL) 10 MG tablet Take 10 mg by mouth at bedtime.     amLODipine (NORVASC) 5 MG tablet Take 5 mg by mouth daily.     anastrozole (ARIMIDEX) 1 MG tablet Take 1 tablet (1 mg total) by mouth daily. 90 tablet 3   atorvastatin (LIPITOR) 10 MG tablet Take 10 mg by mouth daily.     cholecalciferol (VITAMIN D3) 25 MCG (1000 UNIT) tablet Take 1,000 Units by mouth daily.     diphenhydrAMINE (BENADRYL) 25 MG tablet Take 25 mg by mouth every 6 (six) hours as needed.     diphenoxylate-atropine (LOMOTIL) 2.5-0.025 MG tablet Take 1 tablet by mouth 4 (four) times daily as needed for diarrhea or loose stools. 30 tablet 3   ibuprofen (ADVIL) 800 MG tablet Take 1 tablet (800 mg total) by mouth every 8 (eight) hours as needed. 30 tablet 0   ibuprofen (ADVIL) 800 MG tablet Take 1 tablet (800 mg total) by mouth every 8 (eight) hours as needed. 30 tablet 0   levothyroxine (SYNTHROID) 50 MCG tablet Take 50 mcg by mouth daily.     metroNIDAZOLE (METROCREAM) 0.75 % cream Apply topically 2 (two) times daily as needed.     vitamin B-12 (CYANOCOBALAMIN) 500 MCG tablet Take by mouth.     vitamin C (ASCORBIC ACID) 500 MG tablet Take 500 mg by mouth daily.     [DISCONTINUED] prochlorperazine (COMPAZINE) 10 MG tablet Take 1 tablet (10 mg total) by mouth every 6 (six) hours as needed (Nausea or vomiting). 30 tablet 1   No current  facility-administered medications on file prior to visit.     There were no vitals taken for this visit.  Physical Exam  CTA Results:      A/P:  Ascending Aortic Aneurysm HTN HLD- continue statin H/O Breast Cancer Hypothyroidism  RTC in 1 year with repeat CTA Chest   Risk Modification:  Patient was counseled on importance of Blood Pressure Control.  Despite Medical intervention if the patient notices persistently elevated blood pressure readings.  They are instructed to contact their Primary Care Physician  Please avoid use of Fluoroquinolones as this can potentially increase your risk of Aortic Rupture and/or Dissection  Patient educated on signs and symptoms of Aortic Dissection, handout also provided in AVS  Conlin Brahm, PA-C 02/24/23

## 2023-02-24 NOTE — Patient Instructions (Incomplete)
Patient is counseled regarding the importance of long term risk factor modification as they pertain to the presence of ischemic heart disease including avoiding the use of all tobacco products, dietary modifications and medical therapy for diabetes, cholesterol and lipid management, and regular exercise.    AVOID FLOUROQUINOLONES AS THESE CAN INCREASE RISK OF AORTIC DISSECTION

## 2023-02-25 ENCOUNTER — Other Ambulatory Visit: Payer: 59

## 2023-03-04 ENCOUNTER — Ambulatory Visit: Payer: 59

## 2023-03-04 ENCOUNTER — Telehealth: Payer: Self-pay | Admitting: *Deleted

## 2023-03-04 ENCOUNTER — Other Ambulatory Visit: Payer: 59

## 2023-03-04 DIAGNOSIS — Z170 Estrogen receptor positive status [ER+]: Secondary | ICD-10-CM

## 2023-03-04 NOTE — Telephone Encounter (Signed)
Received call from pt with complaint of increase fatigue.  Per MD pt needing iron studies drawn prior to next appt.  Orders placed, lab appt scheduled, pt verbalized understanding.

## 2023-03-10 ENCOUNTER — Inpatient Hospital Stay: Payer: 59 | Attending: Hematology and Oncology

## 2023-03-10 DIAGNOSIS — Z170 Estrogen receptor positive status [ER+]: Secondary | ICD-10-CM | POA: Insufficient documentation

## 2023-03-10 DIAGNOSIS — C50412 Malignant neoplasm of upper-outer quadrant of left female breast: Secondary | ICD-10-CM | POA: Insufficient documentation

## 2023-03-10 DIAGNOSIS — Z79899 Other long term (current) drug therapy: Secondary | ICD-10-CM | POA: Insufficient documentation

## 2023-03-10 LAB — CMP (CANCER CENTER ONLY)
ALT: 30 U/L (ref 0–44)
AST: 33 U/L (ref 15–41)
Albumin: 4.4 g/dL (ref 3.5–5.0)
Alkaline Phosphatase: 135 U/L — ABNORMAL HIGH (ref 38–126)
Anion gap: 6 (ref 5–15)
BUN: 16 mg/dL (ref 8–23)
CO2: 32 mmol/L (ref 22–32)
Calcium: 10.1 mg/dL (ref 8.9–10.3)
Chloride: 103 mmol/L (ref 98–111)
Creatinine: 0.71 mg/dL (ref 0.44–1.00)
GFR, Estimated: >60 mL/min (ref >60)
Glucose, Bld: 86 mg/dL (ref 70–99)
Potassium: 4.2 mmol/L (ref 3.5–5.1)
Sodium: 141 mmol/L (ref 135–145)
Total Bilirubin: 0.7 mg/dL (ref <1.2)
Total Protein: 7.6 g/dL (ref 6.5–8.1)

## 2023-03-10 LAB — CBC WITH DIFFERENTIAL (CANCER CENTER ONLY)
Abs Immature Granulocytes: 0 10*3/uL (ref 0.00–0.07)
Basophils Absolute: 0 10*3/uL (ref 0.0–0.1)
Basophils Relative: 0 %
Eosinophils Absolute: 0.1 10*3/uL (ref 0.0–0.5)
Eosinophils Relative: 2 %
HCT: 42.8 % (ref 36.0–46.0)
Hemoglobin: 14.3 g/dL (ref 12.0–15.0)
Immature Granulocytes: 0 %
Lymphocytes Relative: 36 %
Lymphs Abs: 1.6 10*3/uL (ref 0.7–4.0)
MCH: 31.4 pg (ref 26.0–34.0)
MCHC: 33.4 g/dL (ref 30.0–36.0)
MCV: 94.1 fL (ref 80.0–100.0)
Monocytes Absolute: 0.4 10*3/uL (ref 0.1–1.0)
Monocytes Relative: 8 %
Neutro Abs: 2.4 10*3/uL (ref 1.7–7.7)
Neutrophils Relative %: 54 %
Platelet Count: 144 10*3/uL — ABNORMAL LOW (ref 150–400)
RBC: 4.55 MIL/uL (ref 3.87–5.11)
RDW: 13.4 % (ref 11.5–15.5)
WBC Count: 4.5 10*3/uL (ref 4.0–10.5)
nRBC: 0 % (ref 0.0–0.2)

## 2023-03-10 LAB — FERRITIN: Ferritin: 83 ng/mL (ref 11–307)

## 2023-03-10 LAB — IRON AND IRON BINDING CAPACITY (CC-WL,HP ONLY)
Iron: 147 ug/dL (ref 28–170)
Saturation Ratios: 49 % — ABNORMAL HIGH (ref 10.4–31.8)
TIBC: 298 ug/dL (ref 250–450)
UIBC: 151 ug/dL (ref 148–442)

## 2023-03-10 LAB — VITAMIN B12: Vitamin B-12: 626 pg/mL (ref 180–914)

## 2023-03-12 ENCOUNTER — Inpatient Hospital Stay (HOSPITAL_BASED_OUTPATIENT_CLINIC_OR_DEPARTMENT_OTHER): Payer: 59 | Admitting: Hematology and Oncology

## 2023-03-12 DIAGNOSIS — C50412 Malignant neoplasm of upper-outer quadrant of left female breast: Secondary | ICD-10-CM

## 2023-03-12 DIAGNOSIS — R0601 Orthopnea: Secondary | ICD-10-CM

## 2023-03-12 DIAGNOSIS — Z170 Estrogen receptor positive status [ER+]: Secondary | ICD-10-CM

## 2023-03-12 MED ORDER — ANASTROZOLE 1 MG PO TABS: 1.0000 mg | ORAL_TABLET | Freq: Every day | ORAL | Status: DC

## 2023-03-12 NOTE — Assessment & Plan Note (Signed)
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 Neoadjuvant chemotherapy with TCHP x6 completed 12/05/2021 followed by Kadcyla maintenance 01/23/2022.  (Discontinued when we realized that her original pathology was HER2 negative) 2. 01/01/2022: Left lumpectomy: Grade 2 IDC 1.5 cm with intermediate grade DCIS, margins negative, lymphovascular invasion identified, 2/6 lymph nodes positive ER 90%, PR 90%, Ki-67 5%, HER2 1+ 3. Followed by adjuvant radiation therapy completed 04/10/2022 4.  Followed by adjuvant antiestrogen therapy with anastrozole started 07/15/2022 ------------------------------------------------------------------------------------------------------------------- Anastrozole toxicities: Anxiety issues: Sleep issues: She started taking during daytime and this has helped. Fatigue   I encouraged her to exercise regularly and stay active so that the fatigue can get better.   Breast cancer surveillance: Breast exam 03/12/2023: Benign Mammogram 10/29/2022: Benign breast density category B  Return to clinic in 1 year for follow-up visit

## 2023-03-12 NOTE — Progress Notes (Signed)
HEMATOLOGY-ONCOLOGY TELEPHONE VISIT PROGRESS NOTE  I connected with our patient on 03/12/23 at 10:15 AM EST by telephone and verified that I am speaking with the correct person using two identifiers.  I discussed the limitations, risks, security and privacy concerns of performing an evaluation and management service by telephone and the availability of in person appointments.  I also discussed with the patient that there may be a patient responsible charge related to this service. The patient expressed understanding and agreed to proceed.   History of Present Illness:    History of Present Illness   The patient, with a history of breast cancer and osteopenia, presents with fatigue and shortness of breath. She reports that recent blood work, including hemoglobin, electrolytes, kidney and liver function, iron studies, and B12, were all within normal limits. However, she notes that her liver enzymes were previously elevated, her thyroid medication had to be increased, and her cholesterol was high. She also mentions that her osteopenia has nearly progressed to osteoporosis, with a bone scan revealing a score of -2.4.  The patient expresses concern about continuing anastrozole due to its potential impact on her bones and previous side effects of joint pain and anxiety. She is considering discontinuing the medication, despite understanding the risk of breast cancer recurrence. She also reports having poor dentition, which may affect her ability to take bone-strengthening medications like Fosamax.        Oncology History  Malignant neoplasm of upper-outer quadrant of left breast, estrogen receptor positive (HCC)  07/01/2021 Initial Diagnosis   Screening mammogram 06/27/2021 detected abnormality in the left breast which led to diagnostic mammogram and left breast ultrasound that revealed hypoechoic mass with irregular borders 2:30 position 2 cm left axilla: 1 abnormal lymph node, grade 2 IDC with DCIS ER 99%,  PR 95%, HER2 2+ by IHC, FISH positive, Ki-67 20%, left axilla lymph node: Positive   08/08/2021 Cancer Staging   Staging form: Breast, AJCC 8th Edition - Clinical stage from 08/08/2021: Stage IB (cT1c, cN1(f), cM0, G2, ER+, PR+, HER2+) - Signed by Serena Croissant, MD on 08/08/2021 Stage prefix: Initial diagnosis Method of lymph node assessment: Core biopsy Histologic grading system: 3 grade system   08/14/2021 - 12/26/2021 Chemotherapy   Patient is on Treatment Plan : BREAST  Docetaxel + Carboplatin + Trastuzumab + Pertuzumab  (TCHP) q21d / Trastuzumab + Pertuzumab q21d     08/22/2021 - 11/14/2021 Chemotherapy   Patient is on Treatment Plan : BREAST  Docetaxel + Carboplatin + Trastuzumab + Pertuzumab  (TCHP) q21d      01/01/2022 Surgery   Left lumpectomy: Grade 2 IDC 1.5 cm with intermediate grade DCIS, margins negative, lymphovascular invasion identified, 2/6 lymph nodes positive ER 90%, PR 90%, Ki-67 5%, HER2 1+    01/22/2022 -  Chemotherapy   Every 3 weeks x 11 cycles Patient is on Treatment Plan : BREAST ADO-Trastuzumab Emtansine (Kadcyla) q21d      02/13/2022 - 04/10/2022 Radiation Therapy   Site Technique Total Dose (Gy) Dose per Fx (Gy) Completed Fx Beam Energies  Breast, Left: Breast_L 3D 50.4/50.4 1.8 28/28 6X, 10X  Breast, Left: Breast_L_SCV_PAB 3D 50.4/50.4 1.8 28/28 6X, 10X  Breast, Left: Breast_L_Bst 3D 10/10 2 5/5 6X, 10X       REVIEW OF SYSTEMS:   Constitutional: Denies fevers, chills or abnormal weight loss All other systems were reviewed with the patient and are negative. Observations/Objective:     Assessment Plan:  Malignant neoplasm of upper-outer quadrant of left breast, estrogen receptor  positive (HCC) 07/01/2021:Screening mammogram 06/27/2021 detected abnormality in the left breast which led to diagnostic mammogram and left breast ultrasound that revealed hypoechoic mass with irregular borders 2:30 position 2 cm left axilla: 1 abnormal lymph node, grade 2 IDC with  DCIS ER 99%, PR 95%, HER2 2+ by IHC, FISH positive, Ki-67 20%, left axilla lymph node: Positive    Treatment plan: 1. Neoadjuvant chemotherapy with TCHP x6 completed 12/05/2021 followed by Kadcyla maintenance 01/23/2022.  (Discontinued when we realized that her original pathology was HER2 negative) 2. 01/01/2022: Left lumpectomy: Grade 2 IDC 1.5 cm with intermediate grade DCIS, margins negative, lymphovascular invasion identified, 2/6 lymph nodes positive ER 90%, PR 90%, Ki-67 5%, HER2 1+ 3. Followed by adjuvant radiation therapy completed 04/10/2022 4.  Followed by adjuvant antiestrogen therapy with anastrozole started 07/15/2022 ------------------------------------------------------------------------------------------------------------------- Anastrozole toxicities: Anxiety issues: Sleep issues: She started taking during daytime and this has helped. Fatigue   I encouraged her to exercise regularly and stay active so that the fatigue can get better.   Breast cancer surveillance: Breast exam 03/12/2023: Benign Mammogram 10/29/2022: Benign breast density category B  Return to clinic in 1 year for follow-up visit --------------------------------- Assessment and Plan    Breast Cancer with positive lymph nodes Patient experiencing fatigue and joint pain, and expressing concern about osteopenia progression while on Anastrozole. Discussed the risk/benefit of continuing Anastrozole given the 20% risk of recurrence and the potential impact on bone health. -Continue Anastrozole for now. -Consult with dentist regarding the initiation of Fosamax for bone health.  Osteopenia Progression noted with T-score of -2.4, nearing osteoporosis. Patient concerned about further bone loss due to Anastrozole. -Consult with dentist regarding the initiation of Fosamax.  Fatigue and Shortness of Breath Despite normal blood work, patient reports persistent fatigue and shortness of breath. -Order echocardiogram to  evaluate cardiac function. -Follow-up with patient after echocardiogram results are available.          I discussed the assessment and treatment plan with the patient. The patient was provided an opportunity to ask questions and all were answered. The patient agreed with the plan and demonstrated an understanding of the instructions. The patient was advised to call back or seek an in-person evaluation if the symptoms worsen or if the condition fails to improve as anticipated.   I provided 12 minutes of non-face-to-face time during this encounter.  This includes time for charting and coordination of care   Tamsen Meek, MD

## 2023-03-13 ENCOUNTER — Telehealth: Payer: Self-pay | Admitting: Hematology and Oncology

## 2023-03-13 NOTE — Telephone Encounter (Signed)
Patient is aware of scheduled appointment times/dates

## 2023-03-23 ENCOUNTER — Telehealth: Payer: Self-pay | Admitting: Adult Health

## 2023-04-03 ENCOUNTER — Ambulatory Visit (HOSPITAL_COMMUNITY)
Admission: RE | Admit: 2023-04-03 | Discharge: 2023-04-03 | Disposition: A | Discharge status: Home / Self Care | Payer: 59 | Source: Ambulatory Visit | Attending: Hematology and Oncology | Admitting: Hematology and Oncology

## 2023-04-03 DIAGNOSIS — R0601 Orthopnea: Secondary | ICD-10-CM | POA: Diagnosis not present

## 2023-04-03 DIAGNOSIS — Z09 Encounter for follow-up examination after completed treatment for conditions other than malignant neoplasm: Secondary | ICD-10-CM | POA: Diagnosis not present

## 2023-04-03 DIAGNOSIS — Z9012 Acquired absence of left breast and nipple: Secondary | ICD-10-CM | POA: Diagnosis present

## 2023-04-03 LAB — ECHOCARDIOGRAM COMPLETE
AR max vel: 3.4 cm2
AV Area VTI: 3.02 cm2
AV Area mean vel: 3.29 cm2
AV Mean grad: 2.5 mm[Hg]
AV Peak grad: 4.8 mm[Hg]
Ao pk vel: 1.1 m/s
Area-P 1/2: 3.39 cm2
S' Lateral: 2.5 cm

## 2023-04-03 NOTE — Progress Notes (Signed)
*  PRELIMINARY RESULTS* Echocardiogram 2D Echocardiogram has been performed.  Amber Mccormick 04/03/2023, 11:48 AM

## 2023-04-07 ENCOUNTER — Other Ambulatory Visit: Payer: Self-pay | Admitting: Hematology and Oncology

## 2023-04-10 ENCOUNTER — Telehealth: Payer: 59 | Admitting: Hematology and Oncology

## 2023-04-14 ENCOUNTER — Inpatient Hospital Stay: Payer: 59 | Attending: Hematology and Oncology | Admitting: Hematology and Oncology

## 2023-04-14 DIAGNOSIS — C50412 Malignant neoplasm of upper-outer quadrant of left female breast: Secondary | ICD-10-CM

## 2023-04-14 DIAGNOSIS — Z170 Estrogen receptor positive status [ER+]: Secondary | ICD-10-CM | POA: Diagnosis not present

## 2023-04-14 NOTE — Progress Notes (Signed)
 HEMATOLOGY-ONCOLOGY TELEPHONE VISIT PROGRESS NOTE  I connected with our patient on 04/14/23 at  8:00 AM EST by telephone and verified that I am speaking with the correct person using two identifiers.  I discussed the limitations, risks, security and privacy concerns of performing an evaluation and management service by telephone and the availability of in person appointments.  I also discussed with the patient that there may be a patient responsible charge related to this service. The patient expressed understanding and agreed to proceed.   History of Present Illness:    History of Present Illness   The patient, with a history of breast cancer and thyroid disease, presents with ongoing fatigue and breathing issues. Despite these symptoms, she reports feeling pretty good. She has recently undergone chemotherapy, which she suspects may be contributing to her fatigue. She has been told that the side effects of chemotherapy can last for a long time. She is currently on thyroid medication, which was recently increased from 50 to 75. She denies any cardiac issues. Her ECHO was normal recently.        Oncology History  Malignant neoplasm of upper-outer quadrant of left breast, estrogen receptor positive (HCC)  07/01/2021 Initial Diagnosis   Screening mammogram 06/27/2021 detected abnormality in the left breast which led to diagnostic mammogram and left breast ultrasound that revealed hypoechoic mass with irregular borders 2:30 position 2 cm left axilla: 1 abnormal lymph node, grade 2 IDC with DCIS ER 99%, PR 95%, HER2 2+ by IHC, FISH positive, Ki-67 20%, left axilla lymph node: Positive   08/08/2021 Cancer Staging   Staging form: Breast, AJCC 8th Edition - Clinical stage from 08/08/2021: Stage IB (cT1c, cN1(f), cM0, G2, ER+, PR+, HER2+) - Signed by Odean Potts, MD on 08/08/2021 Stage prefix: Initial diagnosis Method of lymph node assessment: Core biopsy Histologic grading system: 3 grade system    08/14/2021 - 12/26/2021 Chemotherapy   Patient is on Treatment Plan : BREAST  Docetaxel  + Carboplatin  + Trastuzumab  + Pertuzumab   (TCHP) q21d / Trastuzumab  + Pertuzumab  q21d     08/22/2021 - 11/14/2021 Chemotherapy   Patient is on Treatment Plan : BREAST  Docetaxel  + Carboplatin  + Trastuzumab  + Pertuzumab   (TCHP) q21d      01/01/2022 Surgery   Left lumpectomy: Grade 2 IDC 1.5 cm with intermediate grade DCIS, margins negative, lymphovascular invasion identified, 2/6 lymph nodes positive ER 90%, PR 90%, Ki-67 5%, HER2 1+    01/22/2022 -  Chemotherapy   Every 3 weeks x 11 cycles Patient is on Treatment Plan : BREAST ADO-Trastuzumab Emtansine  (Kadcyla ) q21d      02/13/2022 - 04/10/2022 Radiation Therapy   Site Technique Total Dose (Gy) Dose per Fx (Gy) Completed Fx Beam Energies  Breast, Left: Breast_L 3D 50.4/50.4 1.8 28/28 6X, 10X  Breast, Left: Breast_L_SCV_PAB 3D 50.4/50.4 1.8 28/28 6X, 10X  Breast, Left: Breast_L_Bst 3D 10/10 2 5/5 6X, 10X       REVIEW OF SYSTEMS:   Constitutional: Denies fevers, chills or abnormal weight loss All other systems were reviewed with the patient and are negative. Observations/Objective:     Assessment Plan:  Malignant neoplasm of upper-outer quadrant of left breast, estrogen receptor positive (HCC) 07/01/2021:Screening mammogram 06/27/2021 detected abnormality in the left breast which led to diagnostic mammogram and left breast ultrasound that revealed hypoechoic mass with irregular borders 2:30 position 2 cm left axilla: 1 abnormal lymph node, grade 2 IDC with DCIS ER 99%, PR 95%, HER2 2+ by IHC, FISH positive, Ki-67 20%, left axilla  lymph node: Positive    Treatment plan: 1. Neoadjuvant chemotherapy with TCHP x6 completed 12/05/2021 followed by Kadcyla  maintenance 01/23/2022.  (Discontinued when we realized that her original pathology was HER2 negative) 2. 01/01/2022: Left lumpectomy: Grade 2 IDC 1.5 cm with intermediate grade DCIS, margins negative,  lymphovascular invasion identified, 2/6 lymph nodes positive ER 90%, PR 90%, Ki-67 5%, HER2 1+ 3. Followed by adjuvant radiation therapy completed 04/10/2022 4.  Followed by adjuvant antiestrogen therapy with anastrozole  started 07/15/2022 ------------------------------------------------------------------------------------------------------------------- Anastrozole  toxicities: Anxiety issues: Sleep issues: She started taking during daytime and this has helped. Fatigue   I encouraged her to exercise regularly and stay active so that the fatigue can get better.   Breast cancer surveillance: Breast exam 03/12/2023: Benign Mammogram 10/29/2022: Benign breast density category B Fatigue: Echocardiogram 04/03/2023: EF 65 to 70%: No issues with cardiac function.  Osteopenia T-score -2.4: Recommended bisphosphonate therapy along with calcium and vitamin D      Fatigue and Dyspnea Echocardiogram showed excellent cardiac function (EF 65-70%). No anemia, electrolyte, kidney, or liver abnormalities. Patient is on thyroid replacement therapy, which was recently increased. Fatigue and dyspnea may be residual effects of chemotherapy. -Recommend follow-up with primary care provider to ensure thyroid levels are appropriate. -Suggest gradually increasing physical activity and potentially working with a trainer to improve strength and endurance.  Follow-up Scheduled to see Morna on January 28th, 2025.          I discussed the assessment and treatment plan with the patient. The patient was provided an opportunity to ask questions and all were answered. The patient agreed with the plan and demonstrated an understanding of the instructions. The patient was advised to call back or seek an in-person evaluation if the symptoms worsen or if the condition fails to improve as anticipated.   I provided 20 minutes of non-face-to-face time during this encounter.  This includes time for charting and coordination of  care   Naomi MARLA Chad, MD

## 2023-04-14 NOTE — Assessment & Plan Note (Signed)
 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 Neoadjuvant chemotherapy with TCHP x6 completed 12/05/2021 followed by Kadcyla  maintenance 01/23/2022.  (Discontinued when we realized that her original pathology was HER2 negative) 2. 01/01/2022: Left lumpectomy: Grade 2 IDC 1.5 cm with intermediate grade DCIS, margins negative, lymphovascular invasion identified, 2/6 lymph nodes positive ER 90%, PR 90%, Ki-67 5%, HER2 1+ 3. Followed by adjuvant radiation therapy completed 04/10/2022 4.  Followed by adjuvant antiestrogen therapy with anastrozole  started 07/15/2022 ------------------------------------------------------------------------------------------------------------------- Anastrozole  toxicities: Anxiety issues: Sleep issues: She started taking during daytime and this has helped. Fatigue   I encouraged her to exercise regularly and stay active so that the fatigue can get better.   Breast cancer surveillance: Breast exam 03/12/2023: Benign Mammogram 10/29/2022: Benign breast density category B Fatigue: Echocardiogram 04/03/2023: EF 65 to 70%: No issues with cardiac function.  Osteopenia T-score -2.4: Recommended bisphosphonate therapy along with calcium and vitamin D  Return to clinic in 1 year for follow-up visit

## 2023-04-17 ENCOUNTER — Encounter: Payer: Self-pay | Admitting: Hematology and Oncology

## 2023-04-28 ENCOUNTER — Telehealth: Payer: Self-pay | Admitting: Adult Health

## 2023-04-28 ENCOUNTER — Inpatient Hospital Stay: Payer: 59 | Admitting: Adult Health

## 2023-04-28 NOTE — Telephone Encounter (Signed)
Patient called to reschedule appointment. Appointment rescheduled.

## 2023-05-05 ENCOUNTER — Encounter: Payer: Self-pay | Admitting: Hematology and Oncology

## 2023-05-11 ENCOUNTER — Telehealth: Payer: Self-pay | Admitting: Adult Health

## 2023-05-11 NOTE — Telephone Encounter (Signed)
 Canceled appointment per incoming call by the patient. Patient states she will call back to reschedule when ready.

## 2023-05-12 ENCOUNTER — Encounter: Payer: 59 | Admitting: Adult Health

## 2023-09-09 ENCOUNTER — Other Ambulatory Visit: Payer: Self-pay | Admitting: *Deleted

## 2023-09-09 DIAGNOSIS — Z170 Estrogen receptor positive status [ER+]: Secondary | ICD-10-CM

## 2023-09-11 ENCOUNTER — Other Ambulatory Visit: Payer: Self-pay

## 2023-10-14 ENCOUNTER — Ambulatory Visit: Payer: 59 | Admitting: Hematology and Oncology

## 2023-10-20 NOTE — Assessment & Plan Note (Signed)
 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 Neoadjuvant chemotherapy with TCHP x6 completed 12/05/2021 followed by Kadcyla  maintenance 01/23/2022.  (Discontinued when we realized that her original pathology was HER2 negative) 2. 01/01/2022: Left lumpectomy: Grade 2 IDC 1.5 cm with intermediate grade DCIS, margins negative, lymphovascular invasion identified, 2/6 lymph nodes positive ER 90%, PR 90%, Ki-67 5%, HER2 1+ 3. Followed by adjuvant radiation therapy completed 04/10/2022 4.  Followed by adjuvant antiestrogen therapy with anastrozole  started 07/15/2022 ------------------------------------------------------------------------------------------------------------------- Anastrozole  toxicities: Anxiety issues: Sleep issues: She started taking during daytime and this has helped. Fatigue   I encouraged her to exercise regularly and stay active so that the fatigue can get better.   Breast cancer surveillance: Breast exam 03/12/2023: Benign Mammogram 10/29/2022: Benign breast density category B Fatigue: Echocardiogram 04/03/2023: EF 65 to 70%: No issues with cardiac function.   Osteopenia T-score -2.4: Recommended bisphosphonate therapy along with calcium and vitamin D

## 2023-10-21 ENCOUNTER — Inpatient Hospital Stay: Attending: Hematology and Oncology | Admitting: Hematology and Oncology

## 2023-10-21 VITALS — BP 145/72 | HR 87 | Temp 98.1°F | Resp 17 | Wt 152.7 lb

## 2023-10-21 DIAGNOSIS — M8580 Other specified disorders of bone density and structure, unspecified site: Secondary | ICD-10-CM | POA: Insufficient documentation

## 2023-10-21 DIAGNOSIS — M25569 Pain in unspecified knee: Secondary | ICD-10-CM | POA: Diagnosis not present

## 2023-10-21 DIAGNOSIS — Z1721 Progesterone receptor positive status: Secondary | ICD-10-CM | POA: Insufficient documentation

## 2023-10-21 DIAGNOSIS — Z170 Estrogen receptor positive status [ER+]: Secondary | ICD-10-CM | POA: Insufficient documentation

## 2023-10-21 DIAGNOSIS — Z923 Personal history of irradiation: Secondary | ICD-10-CM | POA: Diagnosis not present

## 2023-10-21 DIAGNOSIS — Z780 Asymptomatic menopausal state: Secondary | ICD-10-CM | POA: Diagnosis not present

## 2023-10-21 DIAGNOSIS — Z79899 Other long term (current) drug therapy: Secondary | ICD-10-CM | POA: Diagnosis not present

## 2023-10-21 DIAGNOSIS — C50412 Malignant neoplasm of upper-outer quadrant of left female breast: Secondary | ICD-10-CM | POA: Diagnosis not present

## 2023-10-21 NOTE — Progress Notes (Signed)
 Patient Care Team: Douglass Gerard VEAR DEVONNA as PCP - General (Physician Assistant) Glean Stephane BROCKS, RN (Inactive) as Oncology Nurse Navigator Tyree Nanetta SAILOR, RN as Oncology Nurse Navigator Odean Potts, MD as Consulting Physician (Hematology and Oncology) Lavona Agent, MD as Consulting Physician (Cardiology) Ernie Cough, MD as Consulting Physician (Orthopedic Surgery) Dewey Rush, MD as Consulting Physician (Radiation Oncology)  DIAGNOSIS:  Encounter Diagnosis  Name Primary?   Malignant neoplasm of upper-outer quadrant of left breast in female, estrogen receptor positive (HCC) Yes    SUMMARY OF ONCOLOGIC HISTORY: Oncology History  Malignant neoplasm of upper-outer quadrant of left breast, estrogen receptor positive (HCC)  07/01/2021 Initial Diagnosis   Screening mammogram 06/27/2021 detected abnormality in the left breast which led to diagnostic mammogram and left breast ultrasound that revealed hypoechoic mass with irregular borders 2:30 position 2 cm left axilla: 1 abnormal lymph node, grade 2 IDC with DCIS ER 99%, PR 95%, HER2 2+ by IHC, FISH positive, Ki-67 20%, left axilla lymph node: Positive   08/08/2021 Cancer Staging   Staging form: Breast, AJCC 8th Edition - Clinical stage from 08/08/2021: Stage IB (cT1c, cN1(f), cM0, G2, ER+, PR+, HER2+) - Signed by Odean Potts, MD on 08/08/2021 Stage prefix: Initial diagnosis Method of lymph node assessment: Core biopsy Histologic grading system: 3 grade system   08/14/2021 - 12/26/2021 Chemotherapy   Patient is on Treatment Plan : BREAST  Docetaxel  + Carboplatin  + Trastuzumab  + Pertuzumab   (TCHP) q21d / Trastuzumab  + Pertuzumab  q21d     08/22/2021 - 11/14/2021 Chemotherapy   Patient is on Treatment Plan : BREAST  Docetaxel  + Carboplatin  + Trastuzumab  + Pertuzumab   (TCHP) q21d      01/01/2022 Surgery   Left lumpectomy: Grade 2 IDC 1.5 cm with intermediate grade DCIS, margins negative, lymphovascular invasion identified, 2/6 lymph nodes  positive ER 90%, PR 90%, Ki-67 5%, HER2 1+    01/22/2022 -  Chemotherapy   Every 3 weeks x 11 cycles Patient is on Treatment Plan : BREAST ADO-Trastuzumab Emtansine  (Kadcyla ) q21d      02/13/2022 - 04/10/2022 Radiation Therapy   Site Technique Total Dose (Gy) Dose per Fx (Gy) Completed Fx Beam Energies  Breast, Left: Breast_L 3D 50.4/50.4 1.8 28/28 6X, 10X  Breast, Left: Breast_L_SCV_PAB 3D 50.4/50.4 1.8 28/28 6X, 10X  Breast, Left: Breast_L_Bst 3D 10/10 2 5/5 6X, 10X     06/2022 -  Anti-estrogen oral therapy   Anastrozole  x 7 years     CHIEF COMPLIANT: Surveillance of breast cancer  HISTORY OF PRESENT ILLNESS:   History of Present Illness Amber Mccormick is a 68 year old female with breast cancer who presents for follow-up regarding her treatment and bone health.  Diagnosed with breast cancer in April 2023. Experiences ongoing fatigue, less severe than immediately post-treatment. Discontinued anastrozole  due to severe insomnia and joint pain. Currently not on hormone therapy.  Knee pain for the past couple of months, worsens with pressure. Uses Advil  for pain management.  Not on bone-strengthening medications. Focuses on dietary calcium, vitamin D, and exercise for bone health. Bone density test conducted in October 2023, next scheduled for October 2026. Mammogram due on August 1st, 2025, following the last one on July 31st, 2024.  No breast pain or discomfort.     ALLERGIES:  has no known allergies.  MEDICATIONS:  Current Outpatient Medications  Medication Sig Dispense Refill   ALPRAZolam (XANAX) 0.25 MG tablet Take 0.25 mg by mouth at bedtime as needed for sleep.     amLODipine (  NORVASC) 5 MG tablet Take 5 mg by mouth daily.     atorvastatin (LIPITOR) 10 MG tablet Take 10 mg by mouth daily.     cholecalciferol (VITAMIN D3) 25 MCG (1000 UNIT) tablet Take 1,000 Units by mouth daily.     diphenhydrAMINE  (BENADRYL ) 25 MG tablet Take 25 mg by mouth every 6 (six) hours as  needed.     diphenoxylate -atropine  (LOMOTIL ) 2.5-0.025 MG tablet Take 1 tablet by mouth 4 (four) times daily as needed for diarrhea or loose stools. 30 tablet 3   ibuprofen  (ADVIL ) 800 MG tablet Take 1 tablet (800 mg total) by mouth every 8 (eight) hours as needed. 30 tablet 0   levothyroxine (SYNTHROID) 50 MCG tablet Take 50 mcg by mouth daily.     metroNIDAZOLE (METROCREAM) 0.75 % cream Apply topically 2 (two) times daily as needed.     vitamin B-12 (CYANOCOBALAMIN ) 500 MCG tablet Take by mouth.     vitamin C (ASCORBIC ACID) 500 MG tablet Take 500 mg by mouth daily.     No current facility-administered medications for this visit.    PHYSICAL EXAMINATION: ECOG PERFORMANCE STATUS: 1 - Symptomatic but completely ambulatory  Vitals:   10/21/23 1105 10/21/23 1109  BP: (!) 164/81 (!) 145/72  Pulse: 92 87  Resp: 17   Temp: 98.1 F (36.7 C)   SpO2: 97%    Filed Weights   10/21/23 1105  Weight: 152 lb 11.2 oz (69.3 kg)    Physical Exam No palpable lumps noted to bilateral breasts or axilla  (exam performed in the presence of a chaperone)  LABORATORY DATA:  I have reviewed the data as listed    Latest Ref Rng & Units 03/10/2023   10:43 AM 07/03/2022    9:57 AM 06/11/2022   10:18 AM  CMP  Glucose 70 - 99 mg/dL 86  892  863   BUN 8 - 23 mg/dL 16  10  14    Creatinine 0.44 - 1.00 mg/dL 9.28  9.40  9.37   Sodium 135 - 145 mmol/L 141  140  139   Potassium 3.5 - 5.1 mmol/L 4.2  3.6  3.6   Chloride 98 - 111 mmol/L 103  105  104   CO2 22 - 32 mmol/L 32  29  30   Calcium 8.9 - 10.3 mg/dL 89.8  9.7  9.1   Total Protein 6.5 - 8.1 g/dL 7.6  7.0  6.9   Total Bilirubin <1.2 mg/dL 0.7  0.8  0.6   Alkaline Phos 38 - 126 U/L 135  107  125   AST 15 - 41 U/L 33  37  37   ALT 0 - 44 U/L 30  19  21      Lab Results  Component Value Date   WBC 4.5 03/10/2023   HGB 14.3 03/10/2023   HCT 42.8 03/10/2023   MCV 94.1 03/10/2023   PLT 144 (L) 03/10/2023   NEUTROABS 2.4 03/10/2023     ASSESSMENT & PLAN:  Malignant neoplasm of upper-outer quadrant of left breast, estrogen receptor positive (HCC) 07/01/2021:Screening mammogram 06/27/2021 detected abnormality in the left breast which led to diagnostic mammogram and left breast ultrasound that revealed hypoechoic mass with irregular borders 2:30 position 2 cm left axilla: 1 abnormal lymph node, 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 Neoadjuvant chemotherapy with TCHP x6 completed 12/05/2021 followed by Kadcyla   maintenance 01/23/2022.  (Discontinued when we realized that her original pathology was HER2 negative) 2. 01/01/2022: Left lumpectomy: Grade 2 IDC 1.5 cm with intermediate grade DCIS, margins negative, lymphovascular invasion identified, 2/6 lymph nodes positive ER 90%, PR 90%, Ki-67 5%, HER2 1+ 3. Followed by adjuvant radiation therapy completed 04/10/2022 4.  Followed by adjuvant antiestrogen therapy with anastrozole  started 07/15/2022 ------------------------------------------------------------------------------------------------------------------- Anastrozole  toxicities: Anxiety issues: Sleep issues: She started taking during daytime and this has helped. Fatigue   I encouraged her to exercise regularly and stay active so that the fatigue can get better. I recommend another bone density test towards end of the year.   Breast cancer surveillance: Breast exam 03/12/2023: Benign Mammogram 10/29/2022: Benign breast density category B Fatigue: Echocardiogram 04/03/2023: EF 65 to 70%: No issues with cardiac function.   Osteopenia T-score -2.4: Recommended bisphosphonate therapy along with calcium and vitamin D Patient is not keen on taking any more medications. ------------------------------------- Assessment and Plan Assessment & Plan Malignant neoplasm of upper-outer quadrant of left breast Estrogen receptor-positive breast  cancer diagnosed in April 2023. Currently asymptomatic. Anastrozole  discontinued due to severe arthralgia and insomnia. She preferred to avoid alternative hormonal therapies due to concerns about similar side effects and potential impact on bone health. - Continue annual follow-up visits for up to five years. - Ensure mammogram is scheduled for August 1st.  Arthralgia Persistent knee pain likely due to arthritis rather than bone metastasis. Managed with Advil . Bone density test not due until October 2026, but may consider earlier if symptoms worsen and she agrees to treatment. - Consider using a knee brace for support during activities. - Continue current pain management with Advil . - Schedule bone density test for October 2026, but consider earlier if symptoms worsen and she agrees to treatment.      No orders of the defined types were placed in this encounter.  The patient has a good understanding of the overall plan. she agrees with it. she will call with any problems that may develop before the next visit here. Total time spent: 30 mins including face to face time and time spent for planning, charting and co-ordination of care   Naomi MARLA Chad, MD 10/21/23

## 2023-10-22 ENCOUNTER — Other Ambulatory Visit: Payer: Self-pay

## 2023-10-30 ENCOUNTER — Ambulatory Visit
Admission: RE | Admit: 2023-10-30 | Discharge: 2023-10-30 | Disposition: A | Discharge status: Home / Self Care | Source: Ambulatory Visit | Attending: Hematology and Oncology | Admitting: Hematology and Oncology

## 2023-10-30 DIAGNOSIS — C50412 Malignant neoplasm of upper-outer quadrant of left female breast: Secondary | ICD-10-CM

## 2023-11-03 IMAGING — MR MR BREAST BILAT WO/W CM
8 of 13 series · 32 of 48 positions shown · IV contrast (gadavist)
Comparison: 07/08/2021 and earlier

CLINICAL DATA: Patient had ultrasound-guided core biopsy of LEFT
breast performed 07/08/2021, demonstrating invasive ductal
carcinoma, grade 2 and ductal carcinoma in situ. Ultrasound-guided
core biopsy of LEFT axillary lymph node shows metastatic ductal
carcinoma.

EXAM:
BILATERAL BREAST MRI WITH AND WITHOUT CONTRAST
TECHNIQUE: Multiplanar, multisequence MR images of both breasts were obtained
prior to and following the intravenous administration of 7 ml of
Gadavist

[Series 2: t2_tirm_tra ipat (a-p) · axial · 3.0mm · 0.70mm/px · 1 of 55 slices shown]
[im 1/55]
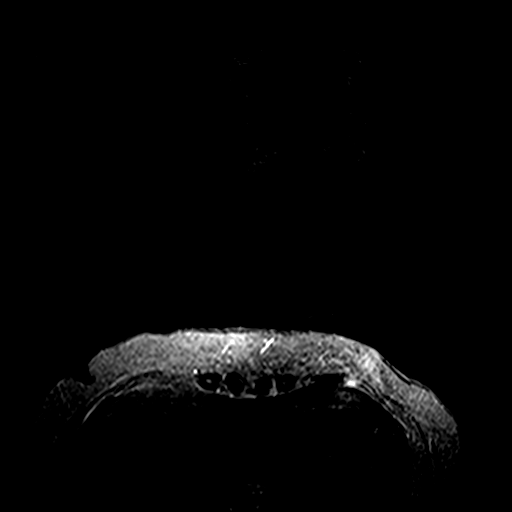

[Series 3: fl3d pre-cm no · axial · non-contrast · 1.2mm · 0.89mm/px · z∈[-64,+88]mm · 5 of 128 slices shown]
[im 1/128]
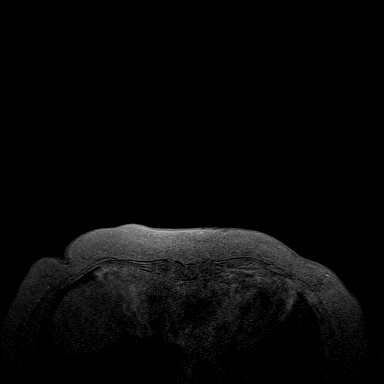
[im 32/128]
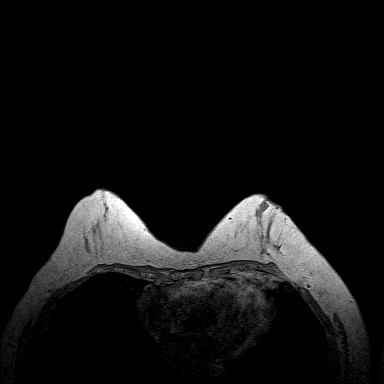
[im 64/128]
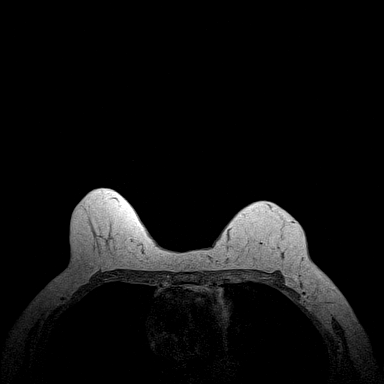
[im 96/128]
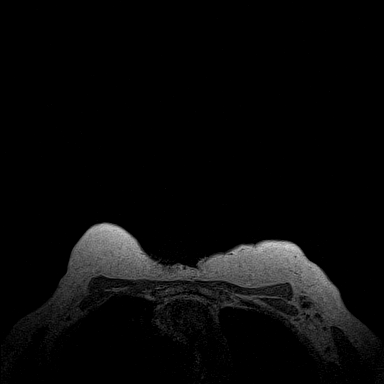
[im 128/128]
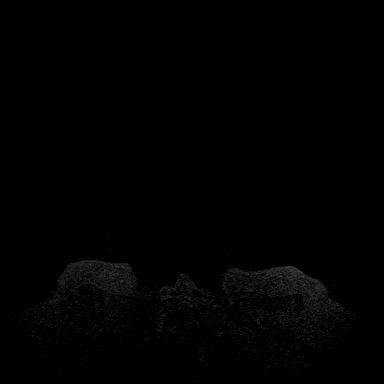

[Series 4: fl3d pre-cm · axial · non-contrast · 1.2mm · 0.89mm/px · z∈[-64,+88]mm · 5 of 128 slices shown]
[im 1/128]
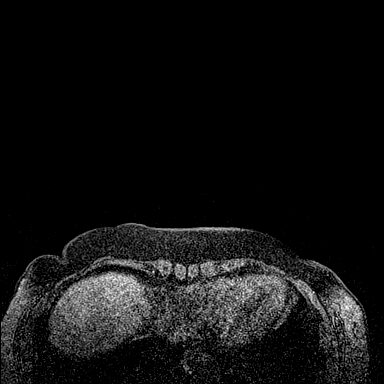
[im 32/128]
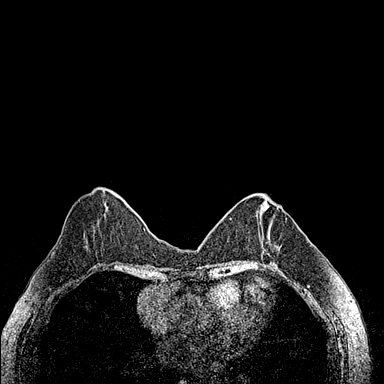
[im 64/128]
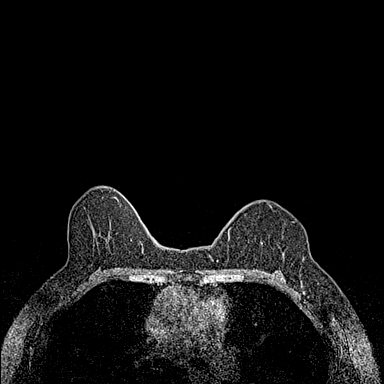
[im 96/128]
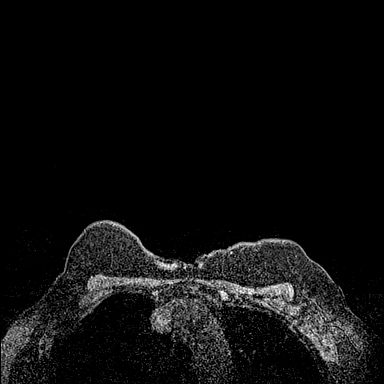
[im 128/128]
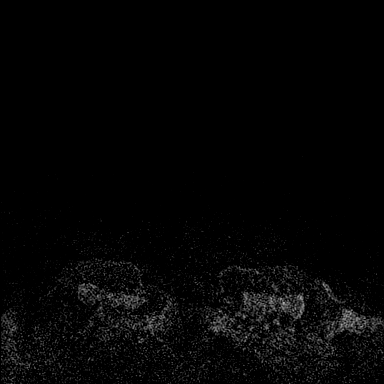

[Series 5: fl3d post-cm 20 · axial · 1.2mm · 0.89mm/px · z∈[-64,+88]mm · 5 of 128 slices shown (1 of 3)]
[im 1/128]
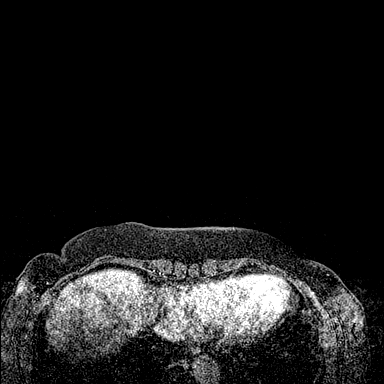
[im 32/128]
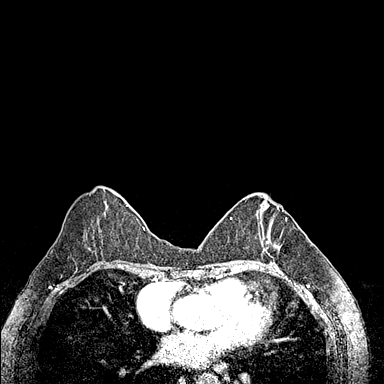
[im 64/128]
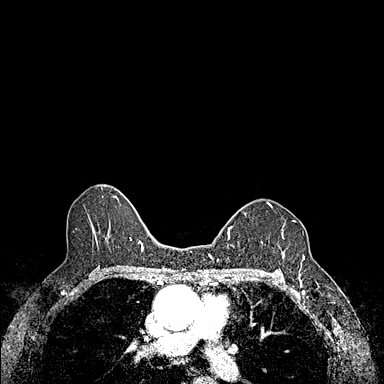
[im 96/128]
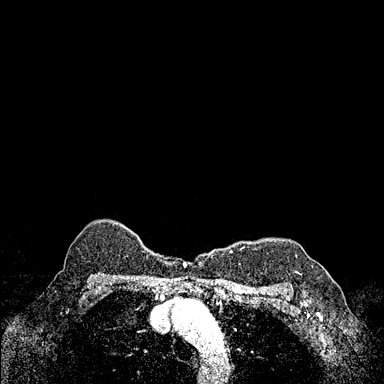
[im 128/128]
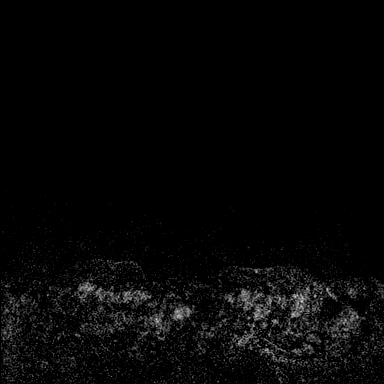

[Series 6: fl3d post-cm 20 · axial · 1.2mm · 0.89mm/px · z∈[-64,+88]mm · 5 of 128 slices shown (2 of 3)]
[im 1/128]
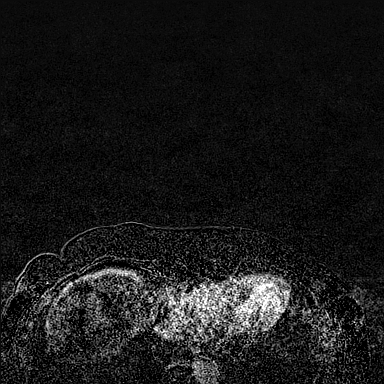
[im 32/128]
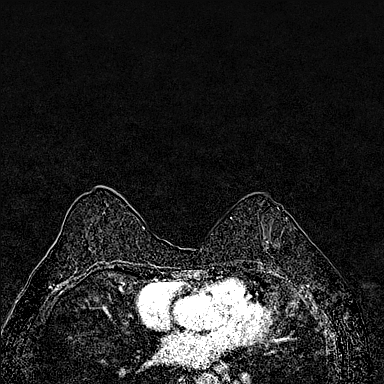
[im 64/128]
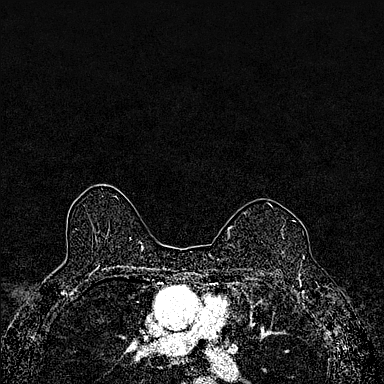
[im 96/128]
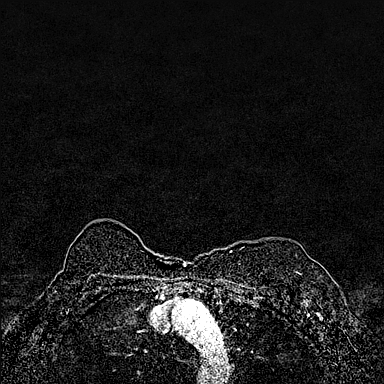
[im 128/128]
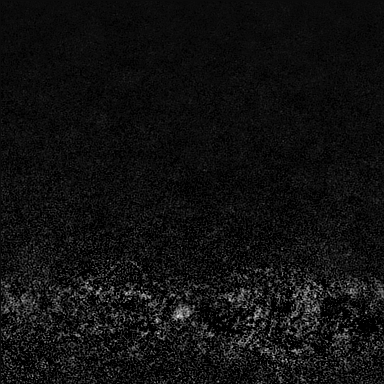

[Series 7: fl3d post-cm 20 · axial · 153.6mm · 0.89mm/px · 1 of 1 slices shown (3 of 3)]
[im 1/1]
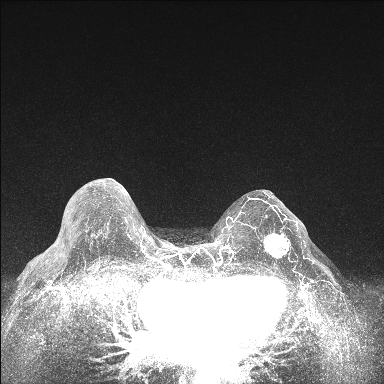

[Series 8: fl3d post-cm 3 · axial · 1.2mm · 0.89mm/px · z∈[-64,+88]mm · 5 of 128 slices shown (1 of 2)]
[im 1/128]
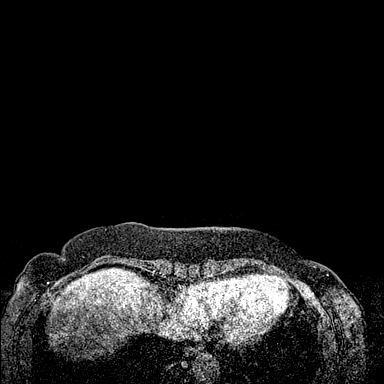
[im 32/128]
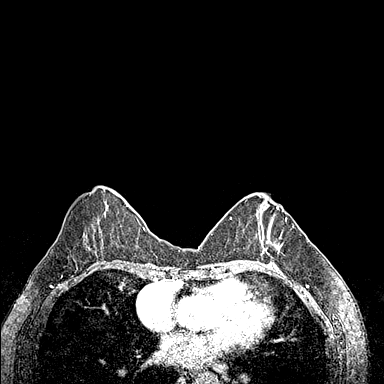
[im 64/128]
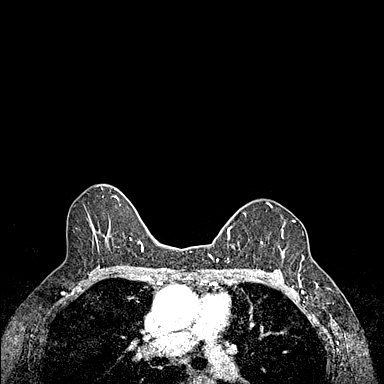
[im 96/128]
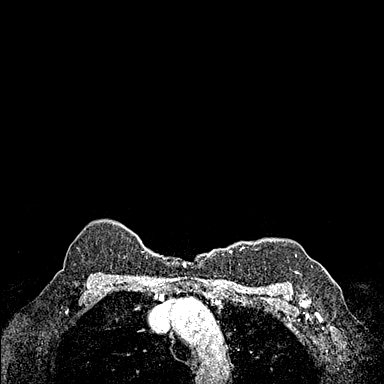
[im 128/128]
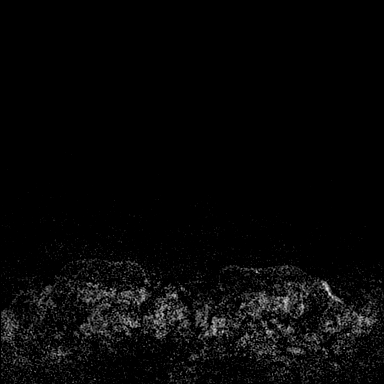

[Series 9: fl3d post-cm 3 · axial · 1.2mm · 0.89mm/px · z∈[-64,+57]mm · 5 of 128 slices shown (2 of 2)]
[im 1/128]
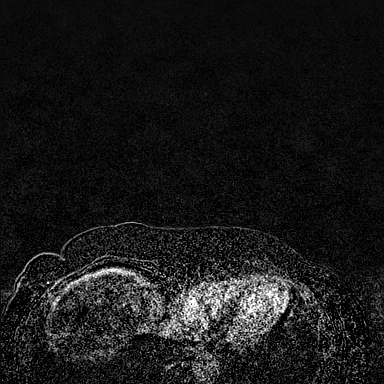
[im 26/128]
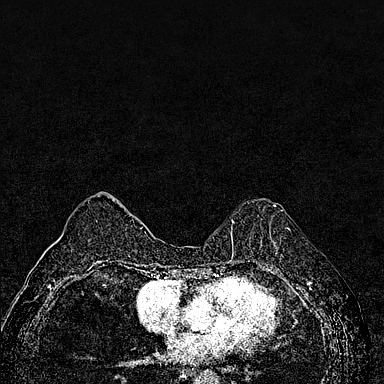
[im 51/128]
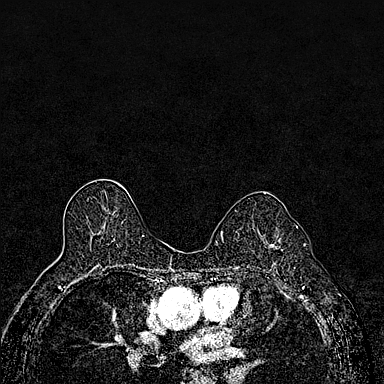
[im 77/128]
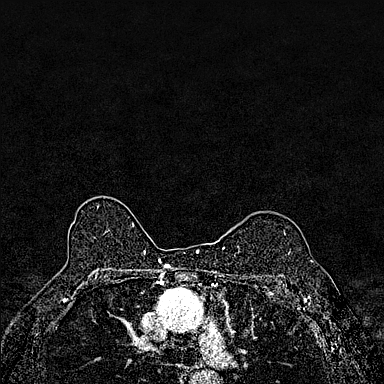
[im 102/128]
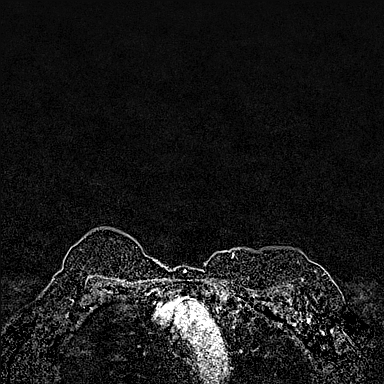

[32 of 48 positions shown; findings below may reference images not displayed]

Three-dimensional MR images were rendered by post-processing of the
original MR data on an independent workstation. The
three-dimensional MR images were interpreted, and findings are
reported in the following complete MRI report for this study. Three
dimensional images were evaluated at the independent interpreting
workstation using the DynaCAD thin client.
FINDINGS: Breast composition: b. Scattered fibroglandular tissue.

Background parenchymal enhancement: Minimal

Right breast: No mass or abnormal enhancement.

Left breast: Enhancing mass in the LATERAL portion of the LEFT
breast is 2.2 x 2.1 x 2.2 Centimeters. Tissue marker clip is present
within the mass following recent biopsy. Mass demonstrates washout
type enhancement kinetics. The anterior aspect of the LEFT
pectoralis muscle is tented by the lesion, but there is no
enhancement within the pectoralis. No other abnormalities are
identified in the LEFT breast.

Lymph nodes: Enlarged LEFT axillary lymph node contains tissue
marker clip artifact. (Image 37 of series 3).

Ancillary findings: The ascending aorta is aneurysmal, measuring
centimeters.
IMPRESSION: 1. Known malignancy in the LATERAL portion of the LEFT breast is
centimeters.
2. Single enlarged LEFT axillary lymph node with biopsy clip
artifact.
3. RIGHT breast is negative.
4. Ascending aortic aneurysm. Recommend annual imaging followup by
CTA or MRA. This recommendation follows 3393
ACCF/AHA/AATS/ACR/ASA/SCA/RUDY/FARRAH/ELANDS/ALESKANDRA Guidelines for the
Diagnosis and Management of Patients with Thoracic Aortic Disease.
Circulation. 3393; 121: [id number]. Aortic aneurysm NOS ([email or web address])

RECOMMENDATION:
1. Recommend treatment plan for known LEFT breast malignancy.
2. Recommend annual imaging follow-up of ascending aortic aneurysm
by CTA or MRA.

BI-RADS CATEGORY  6: Known biopsy-proven malignancy.

## 2024-11-22 ENCOUNTER — Ambulatory Visit: Admitting: Hematology and Oncology
